# Patient Record
Sex: Female | Born: 1935 | Hispanic: Yes | Smoking: Never smoker
Health system: Southern US, Community
[De-identification: ages and names within clinical notes are randomized; demographics above are authoritative.]

## PROBLEM LIST (undated history)

## (undated) DIAGNOSIS — I739 Peripheral vascular disease, unspecified: Secondary | ICD-10-CM

## (undated) DIAGNOSIS — E785 Hyperlipidemia, unspecified: Secondary | ICD-10-CM

## (undated) DIAGNOSIS — J45909 Unspecified asthma, uncomplicated: Secondary | ICD-10-CM

## (undated) DIAGNOSIS — J439 Emphysema, unspecified: Secondary | ICD-10-CM

## (undated) DIAGNOSIS — J42 Unspecified chronic bronchitis: Secondary | ICD-10-CM

## (undated) DIAGNOSIS — E079 Disorder of thyroid, unspecified: Secondary | ICD-10-CM

## (undated) DIAGNOSIS — G309 Alzheimer's disease, unspecified: Secondary | ICD-10-CM

## (undated) DIAGNOSIS — F0280 Dementia in other diseases classified elsewhere without behavioral disturbance: Secondary | ICD-10-CM

## (undated) DIAGNOSIS — I10 Essential (primary) hypertension: Secondary | ICD-10-CM

## (undated) DIAGNOSIS — F341 Dysthymic disorder: Secondary | ICD-10-CM

---

## 2009-03-08 ENCOUNTER — Encounter: Payer: Self-pay | Admitting: Internal Medicine

## 2009-06-09 ENCOUNTER — Encounter: Payer: Self-pay | Admitting: Internal Medicine

## 2009-06-16 ENCOUNTER — Encounter: Payer: Self-pay | Admitting: Internal Medicine

## 2009-06-21 ENCOUNTER — Ambulatory Visit (HOSPITAL_BASED_OUTPATIENT_CLINIC_OR_DEPARTMENT_OTHER): Admission: RE | Admit: 2009-06-21 | Discharge: 2009-06-21 | Payer: Self-pay | Admitting: Internal Medicine

## 2009-06-21 ENCOUNTER — Ambulatory Visit: Payer: Self-pay | Admitting: Diagnostic Radiology

## 2009-08-24 ENCOUNTER — Telehealth: Payer: Self-pay | Admitting: Internal Medicine

## 2009-08-24 ENCOUNTER — Telehealth: Payer: Self-pay | Admitting: Pulmonary Disease

## 2009-10-12 ENCOUNTER — Encounter: Payer: Self-pay | Admitting: Internal Medicine

## 2009-10-26 ENCOUNTER — Ambulatory Visit: Payer: Self-pay | Admitting: Internal Medicine

## 2009-10-26 DIAGNOSIS — R0602 Shortness of breath: Secondary | ICD-10-CM | POA: Insufficient documentation

## 2009-10-26 DIAGNOSIS — F32A Depression, unspecified: Secondary | ICD-10-CM | POA: Insufficient documentation

## 2009-10-26 DIAGNOSIS — E039 Hypothyroidism, unspecified: Secondary | ICD-10-CM | POA: Insufficient documentation

## 2009-10-26 DIAGNOSIS — F329 Major depressive disorder, single episode, unspecified: Secondary | ICD-10-CM

## 2009-10-26 DIAGNOSIS — J4530 Mild persistent asthma, uncomplicated: Secondary | ICD-10-CM

## 2009-10-26 DIAGNOSIS — R05 Cough: Secondary | ICD-10-CM

## 2009-12-08 ENCOUNTER — Telehealth: Payer: Self-pay | Admitting: Internal Medicine

## 2010-03-01 NOTE — Progress Notes (Signed)
Summary: records release  Phone Note Outgoing Call   Call placed by: Carron Curie CMA,  December 08, 2009 1:02 PM Call placed to: pt Summary of Call: I called to ask pt is they signed a release for records from  [place], because I do not see the records or a release. Sounded liek someone picked up the phone but then was disconnected. I called back and line was busy. WCB. Initial call taken by: Carron Curie CMA,  December 08, 2009 1:03 PM  Follow-up for Phone Call        [email or web address]. Carron Curie CMA  December 17, 2009 11:45 AM  ATCx2 no answer, no voicemail., i wil sign offo n note.Carron Curie CMA  December 20, 2009 4:03 PM

## 2010-03-01 NOTE — Progress Notes (Signed)
Summary: nos appt  Phone Note Call from Patient   Caller: [email or web address]  Call For: Garett Tetzloff Summary of Call: ATC pt to rsc nos from 7/25 no vm. Initial call taken by: Darletta Moll,  August 24, 2009 2:07 PM

## 2010-03-01 NOTE — Letter (Signed)
Summary: [facility]   Imported By: Sherian Rein 11/01/2009 10:37:17  _____________________________________________________________________  External Attachment:    Type:   Image     Comment:   External Document

## 2010-03-01 NOTE — Assessment & Plan Note (Signed)
Summary: ASTHMA/ MBW   Visit Type:  Initial Consult Copy to:  Dr. Julio Sicks Primary Provider/Referring Provider:  Dr. Julio Sicks  CC:  Pulmonary Consult for asthma and COPD.Marland Kitchen  History of Present Illness: IOV 10/26/2009: 77 year old [place] immigrant with dementia. Never smoker but pasive smoker trhough age mid-30s..  Brought in by daughter in law Mackenzie Peterson who is interpreter. [name] states  moved to [place] from [place], [place] in Jan 2011 to be with son. Had pulmonologist at [facility]. [name] also saw a DR Henrene Dodge at [facility], [place]. 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 Patient states moderate cough  > 20 years. STable. Worse in morning and worse at night. Sometimes dry and sometimes mucus. Associated chronic daily  GERD + and post nasal drip +.  Cough made worse by "any effort" such as picking up stuff, walking. Cough relieved by relaxingand resting.   Cough is associated with dyspnea. She states she gets dyspneic walking. Dyspnea relieved by rest. Dyspnea is present for activities like changing clothes.   Only outside records available is from Dr Julio Sicks. THere are some spirometries that shows restriciton and one that shows obstrictuion - all from earlier this year.   Preventive Screening-Counseling & Management  Alcohol-Tobacco     Smoking Status: never     Passive Smoke Exposure: yes  Comments: smoked passively from birth to age 53  Current Medications (verified): 1)  Lexapro 20 Mg Tabs (Escitalopram Oxalate) .Marland Kitchen.. 1 Tab Once A Day 2)  Alprazolam 0.25 Mg Tabs (Alprazolam) .Marland Kitchen.. 1 Tab 2 Times A Day As Needed 3)  Nitroglycerin 0.4 Mg/hr Pt24 (Nitroglycerin) .Marland Kitchen.. 1 Spray Every 5 Minutes As Needed 4)  Aspir-Low 81 Mg Tbec (Aspirin) .Marland Kitchen.. 1 Tab Once A Day 5)  Ventolin Hfa 108 (90 Base) Mcg/act Aers (Albuterol Sulfate) .... In[place]le 2 Puffs 4 Times A Day 6)  Vesicare 5 Mg Tabs (Solifenacin Succinate) .Marland Kitchen.. 1 Tab  Once A Day 7)  Aricept 10 Mg Tabs (Donepezil Hcl) .Marland Kitchen.. 1 Tab Once A Day At Bedtime 8)  Namenda 10 Mg Tabs (Memantine Hcl) .Marland Kitchen.. 1 Tab 2 Times A Day 9)  Benzonatate 100 Mg Caps (Benzonatate) .Marland Kitchen.. 1 Cap 3 Times A Day As Needed Cough 10)  Seroquel 25 Mg Tabs (Quetiapine Fumarate) .Marland Kitchen.. 1 Tab Twice Daily As Needed and 2 Tabs At Night Two Times A Day 11)  Levothroid 25 Mcg Tabs (Levothyroxine Sodium) .... Take 1 Tablet By Mouth Once A Day 12)  Tylenol Arthritis Pain 650 Mg Cr-Tabs (Acetaminophen) .... As Needed  Allergies (verified): No Known Drug Allergies  Past History:  Past Medical History: hypertension arthritis overactive bladder alzhemer's dementia......diagnosed 2007  - "middle stage" per d-i-l.   - progressive per d-i-l  - incontinent with stool/urine. Needs diaper depression anxiety lung nodule nos  - dxed in [facility]  - extensively worked up in [facility] Hypothyroidism Depression Asthma  Past Surgical History: none  Family History: Father-COPD, smoker, heart disease, cancer? Mother-heart disease  Social History: Children:1 Lives with daughter in law Patient never smoked.  Smoking Status:  never Passive Smoke Exposure:  yes  Review of Systems       The patient complains of shortness of breath with activity, shortness of breath at rest, productive cough, chest pain, difficulty swallowing, and nasal congestion/difficulty breathing through nose.  The patient denies non-productive cough, coughing up blood, irregular heartbeats, acid heartburn, indigestion, loss of  appetite, weight change, abdominal pain, sore throat, tooth/dental problems, headaches, sneezing, itching, ear ache, anxiety, depression, hand/feet swelling, joint stiffness or pain, rash, change in color of mucus, and fever.    Vital Signs:  Patient profile:   77 year old female Height:      61 inches Weight:      138.25 pounds BMI:     26.22 O2 Sat:      96 % on Room air Temp:     97.9 degrees F  oral Pulse rate:   68 / minute BP sitting:   128 / 70  (right arm) Cuff size:   regular  Vitals Entered By: Carron Curie CMA (October 26, 2009 10:43 AM)  O2 Flow:  Room air CC: Pulmonary Consult for asthma and COPD. Comments Medications reviewed with patient Carron Curie CMA  October 26, 2009 10:46 AM Daytime phone number verified with patient.    Physical Exam  General:  well developed, well nourished, in no acute distress Head:  normocephalic and atraumatic Eyes:  PERRLA/EOM intact; conjunctiva and sclera clear Ears:  TMs intact and clear with normal canals Nose:  no deformity, discharge, inflammation, or lesions Mouth:  no deformity or lesions Neck:  no masses, thyromegaly, or abnormal cervical nodes Chest Wall:  no deformities noted Lungs:  decreased BS bilateral.   ? faint basal crackles Heart:  regular rate and rhythm, S1, S2 without murmurs, rubs, gallops, or clicks Abdomen:  bowel sounds positive; abdomen soft and non-tender without masses, or organomegaly Msk:  no deformity or scoliosis noted with normal posture Pulses:  pulses normal Extremities:  no clubbing, cyanosis, edema, or deformity noted Neurologic:  CN II-XII grossly intact with normal reflexes, coordination, muscle strength and tone Skin:  intact without lesions or rashes Cervical Nodes:  no significant adenopathy Axillary Nodes:  no significant adenopathy Psych:  alert and cooperative; normal mood and affect; normal attention span and concentration   MISC. Report  Procedure date:  10/26/2009  Findings:      THere are some spirometries that shows restriciton and one that shows obstrictuion - all from earlier this year.   Impression & Recommendations:  Problem # 1:  COUGH (ICD-786.2) Assessment New  Unclar cause from hix. But it appears sinus drainage and GERD plaing a role. HABIT cough likely given chronicity and extensive pulmonary workup hx. Given demential family prefer  simplistic approach focussing on quality of life which is appropriate. Outside spirometry is unhelpful due to variation. I will get old recoreds from [facility] and [place], [place] andthen decide appropriate course. FU depending on above  Orders: Consultation Level IV (09811)  Problem # 2:  SHORTNESS OF BREATH (SOB) (ICD-786.05) Assessment: New  as in cough  Orders: Consultation Level IV (91478)  Medications Added to Medication List This Visit: 1)  Levothroid 25 Mcg Tabs (Levothyroxine sodium) .... Take 1 tablet by mouth once a day 2)  Tylenol Arthritis Pain 650 Mg Cr-tabs (Acetaminophen) .... As needed  Patient Instructions: 1)  please sign release to get records from  2)   - [facility] 3)   - Dr Darnelle Maffucci, at [facility],  4)   - we need xray, ct, pft, hpi, dc summary reports 5)  Call us in 2 weeks to check if we have records 6)  FU only after records arrive   Immunization History:  Pneumovax Immunization History:    Pneumovax:  pneumovax (10/01/2006)

## 2010-03-01 NOTE — Progress Notes (Signed)
Summary: append  Phone Note Call from Patient   Caller: [email or web address]  Call For: Kariss Longmire Summary of Call: Ignore last phone note for Dr. Craige Cotta. Initial call taken by: Darletta Moll,  August 24, 2009 2:09 PM

## 2010-03-01 NOTE — Progress Notes (Signed)
Summary: nos appt  Phone Note Call from Patient   Caller: [email or web address]  Call For: Mackenzie Peterson Summary of Call: [facility] x2 to rsc nos from 7/25. Initial call taken by: Darletta Moll,  August 24, 2009 2:01 PM

## 2010-05-02 ENCOUNTER — Encounter: Payer: Self-pay | Admitting: *Deleted

## 2010-05-02 ENCOUNTER — Telehealth: Payer: Self-pay | Admitting: *Deleted

## 2010-05-02 NOTE — Telephone Encounter (Signed)
Summary of Call: Candise Bowens, we never got old records from [place], [place]. What is going on ? Please check with patient family and get recoreds. Give fu appt Initial call taken by: Kalman Shan MD,  April 18, 2010 11:09 PM  Follow-up for Phone Call         In November 2011 I attempted to call pt several times and was unable to reach them. Per MR send a letter asking the pt to contact our office. Please see EPIC for letter. Carron Curie CMA  May 02, 2010 3:37 PM

## 2010-09-23 ENCOUNTER — Emergency Department (HOSPITAL_COMMUNITY)
Admission: EM | Admit: 2010-09-23 | Discharge: 2010-09-25 | Disposition: A | Discharge status: Home / Self Care | Payer: Medicare Other | Attending: Emergency Medicine | Admitting: Emergency Medicine

## 2010-09-23 DIAGNOSIS — R10819 Abdominal tenderness, unspecified site: Secondary | ICD-10-CM | POA: Insufficient documentation

## 2010-09-23 DIAGNOSIS — R0602 Shortness of breath: Secondary | ICD-10-CM | POA: Insufficient documentation

## 2010-09-23 DIAGNOSIS — F329 Major depressive disorder, single episode, unspecified: Secondary | ICD-10-CM | POA: Insufficient documentation

## 2010-09-23 DIAGNOSIS — F3289 Other specified depressive episodes: Secondary | ICD-10-CM | POA: Insufficient documentation

## 2010-09-23 LAB — RAPID URINE DRUG SCREEN, HOSP PERFORMED
Amphetamines: NOT DETECTED
Barbiturates: NOT DETECTED
Benzodiazepines: POSITIVE — AB
Cocaine: NOT DETECTED

## 2010-09-23 LAB — DIFFERENTIAL
Basophils Absolute: 0.1 10*3/uL (ref 0.0–0.1)
Eosinophils Absolute: 0.1 10*3/uL (ref 0.0–0.7)
Lymphocytes Relative: 26 % (ref 12–46)
Lymphs Abs: 2.7 10*3/uL (ref 0.7–4.0)
Monocytes Absolute: 0.7 10*3/uL (ref 0.1–1.0)
Monocytes Relative: 7 % (ref 3–12)
Neutro Abs: 6.9 10*3/uL (ref 1.7–7.7)

## 2010-09-23 LAB — COMPREHENSIVE METABOLIC PANEL
AST: 19 U/L (ref 0–37)
Albumin: 3.9 g/dL (ref 3.5–5.2)
CO2: 28 mEq/L (ref 19–32)
GFR calc non Af Amer: 50 mL/min — ABNORMAL LOW (ref >60)
Glucose, Bld: 100 mg/dL — ABNORMAL HIGH (ref 70–99)
Sodium: 137 mEq/L (ref 135–145)
Total Protein: 7.3 g/dL (ref 6.0–8.3)

## 2010-09-23 LAB — CBC
Hemoglobin: 14.9 g/dL (ref 12.0–15.0)
Platelets: 254 10*3/uL (ref 150–400)
WBC: 10.5 10*3/uL (ref 4.0–10.5)

## 2010-09-23 LAB — ETHANOL: Alcohol, Ethyl (B): <11 mg/dL (ref 0–11)

## 2010-09-24 ENCOUNTER — Emergency Department (HOSPITAL_COMMUNITY): Payer: Medicare Other

## 2010-09-24 LAB — URINALYSIS, ROUTINE W REFLEX MICROSCOPIC
Hgb urine dipstick: NEGATIVE
Ketones, ur: NEGATIVE mg/dL
Leukocytes, UA: NEGATIVE
Nitrite: NEGATIVE
Protein, ur: NEGATIVE mg/dL
Specific Gravity, Urine: 1.022 (ref 1.005–1.030)
Urobilinogen, UA: 0.2 mg/dL (ref 0.0–1.0)
pH: 6.5 (ref 5.0–8.0)

## 2012-01-05 ENCOUNTER — Encounter (HOSPITAL_BASED_OUTPATIENT_CLINIC_OR_DEPARTMENT_OTHER): Payer: Self-pay | Admitting: Emergency Medicine

## 2012-01-05 ENCOUNTER — Emergency Department (HOSPITAL_BASED_OUTPATIENT_CLINIC_OR_DEPARTMENT_OTHER): Payer: Medicare Other

## 2012-01-05 ENCOUNTER — Inpatient Hospital Stay (HOSPITAL_BASED_OUTPATIENT_CLINIC_OR_DEPARTMENT_OTHER)
Admission: EM | Admit: 2012-01-05 | Discharge: 2012-01-08 | DRG: 193 | Disposition: A | Discharge status: Home Health | Payer: Medicare Other | Attending: Internal Medicine | Admitting: Internal Medicine

## 2012-01-05 DIAGNOSIS — Z79899 Other long term (current) drug therapy: Secondary | ICD-10-CM

## 2012-01-05 DIAGNOSIS — F329 Major depressive disorder, single episode, unspecified: Secondary | ICD-10-CM | POA: Diagnosis present

## 2012-01-05 DIAGNOSIS — R197 Diarrhea, unspecified: Secondary | ICD-10-CM | POA: Diagnosis present

## 2012-01-05 DIAGNOSIS — E039 Hypothyroidism, unspecified: Secondary | ICD-10-CM | POA: Diagnosis present

## 2012-01-05 DIAGNOSIS — I10 Essential (primary) hypertension: Secondary | ICD-10-CM | POA: Diagnosis present

## 2012-01-05 DIAGNOSIS — J9620 Acute and chronic respiratory failure, unspecified whether with hypoxia or hypercapnia: Secondary | ICD-10-CM | POA: Diagnosis present

## 2012-01-05 DIAGNOSIS — E785 Hyperlipidemia, unspecified: Secondary | ICD-10-CM | POA: Diagnosis present

## 2012-01-05 DIAGNOSIS — R05 Cough: Secondary | ICD-10-CM

## 2012-01-05 DIAGNOSIS — F0280 Dementia in other diseases classified elsewhere without behavioral disturbance: Secondary | ICD-10-CM | POA: Diagnosis present

## 2012-01-05 DIAGNOSIS — R0602 Shortness of breath: Secondary | ICD-10-CM | POA: Diagnosis present

## 2012-01-05 DIAGNOSIS — J189 Pneumonia, unspecified organism: Principal | ICD-10-CM | POA: Diagnosis present

## 2012-01-05 DIAGNOSIS — J45909 Unspecified asthma, uncomplicated: Secondary | ICD-10-CM | POA: Diagnosis present

## 2012-01-05 DIAGNOSIS — F32A Depression, unspecified: Secondary | ICD-10-CM | POA: Diagnosis present

## 2012-01-05 DIAGNOSIS — Z7982 Long term (current) use of aspirin: Secondary | ICD-10-CM

## 2012-01-05 DIAGNOSIS — D72829 Elevated white blood cell count, unspecified: Secondary | ICD-10-CM | POA: Diagnosis present

## 2012-01-05 DIAGNOSIS — G309 Alzheimer's disease, unspecified: Secondary | ICD-10-CM | POA: Diagnosis present

## 2012-01-05 DIAGNOSIS — J438 Other emphysema: Secondary | ICD-10-CM | POA: Diagnosis present

## 2012-01-05 DIAGNOSIS — F0390 Unspecified dementia without behavioral disturbance: Secondary | ICD-10-CM | POA: Diagnosis present

## 2012-01-05 DIAGNOSIS — I739 Peripheral vascular disease, unspecified: Secondary | ICD-10-CM | POA: Diagnosis present

## 2012-01-05 DIAGNOSIS — R109 Unspecified abdominal pain: Secondary | ICD-10-CM | POA: Diagnosis present

## 2012-01-05 DIAGNOSIS — J4530 Mild persistent asthma, uncomplicated: Secondary | ICD-10-CM | POA: Diagnosis present

## 2012-01-05 HISTORY — DX: Dementia in other diseases classified elsewhere, unspecified severity, without behavioral disturbance, psychotic disturbance, mood disturbance, and anxiety: F02.80

## 2012-01-05 HISTORY — DX: Unspecified chronic bronchitis: J42

## 2012-01-05 HISTORY — DX: Emphysema, unspecified: J43.9

## 2012-01-05 HISTORY — DX: Disorder of thyroid, unspecified: E07.9

## 2012-01-05 HISTORY — DX: Peripheral vascular disease, unspecified: I73.9

## 2012-01-05 HISTORY — DX: Dysthymic disorder: F34.1

## 2012-01-05 HISTORY — DX: Essential (primary) hypertension: I10

## 2012-01-05 HISTORY — DX: Alzheimer's disease, unspecified: G30.9

## 2012-01-05 HISTORY — DX: Unspecified asthma, uncomplicated: J45.909

## 2012-01-05 HISTORY — DX: Hyperlipidemia, unspecified: E78.5

## 2012-01-05 LAB — CBC WITH DIFFERENTIAL/PLATELET
Basophils Absolute: 0 10*3/uL (ref 0.0–0.1)
Eosinophils Absolute: 0.7 10*3/uL (ref 0.0–0.7)
HCT: 38.3 % (ref 36.0–46.0)
Lymphs Abs: 1.1 10*3/uL (ref 0.7–4.0)
MCHC: 34.7 g/dL (ref 30.0–36.0)
Monocytes Absolute: 0.9 10*3/uL (ref 0.1–1.0)
Monocytes Relative: 8 % (ref 3–12)
Neutro Abs: 8.5 10*3/uL — ABNORMAL HIGH (ref 1.7–7.7)
Neutrophils Relative %: 76 % (ref 43–77)
Platelets: 254 10*3/uL (ref 150–400)
RDW: 12.4 % (ref 11.5–15.5)

## 2012-01-05 LAB — URINALYSIS, ROUTINE W REFLEX MICROSCOPIC
Ketones, ur: NEGATIVE mg/dL
Leukocytes, UA: NEGATIVE
Protein, ur: NEGATIVE mg/dL
Specific Gravity, Urine: 1.019 (ref 1.005–1.030)
Urobilinogen, UA: 0.2 mg/dL (ref 0.0–1.0)
pH: 5 (ref 5.0–8.0)

## 2012-01-05 LAB — COMPREHENSIVE METABOLIC PANEL
BUN: 15 mg/dL (ref 6–23)
CO2: 28 mEq/L (ref 19–32)
Calcium: 8.8 mg/dL (ref 8.4–10.5)
Chloride: 102 mEq/L (ref 96–112)
Creatinine, Ser: 1 mg/dL (ref 0.50–1.10)
GFR calc non Af Amer: 50 mL/min — ABNORMAL LOW (ref >90)

## 2012-01-05 LAB — URINE MICROSCOPIC-ADD ON

## 2012-01-05 MED ORDER — SODIUM CHLORIDE 0.9 % IJ SOLN
3.0000 mL | Freq: Two times a day (BID) | INTRAMUSCULAR | Status: DC
  Administered 2012-01-06: 3 mL via INTRAVENOUS
  Administered 2012-01-06: 21:00:00 via INTRAVENOUS
  Administered 2012-01-07 – 2012-01-08 (×2): 3 mL via INTRAVENOUS

## 2012-01-05 MED ORDER — ESCITALOPRAM OXALATE 20 MG PO TABS
20.0000 mg | ORAL_TABLET | Freq: Every day | ORAL | Status: DC
  Administered 2012-01-06 – 2012-01-08 (×3): 20 mg via ORAL
  Filled 2012-01-05 (×3): qty 1

## 2012-01-05 MED ORDER — MEMANTINE HCL 10 MG PO TABS
10.0000 mg | ORAL_TABLET | Freq: Two times a day (BID) | ORAL | Status: DC
  Administered 2012-01-06 – 2012-01-08 (×6): 10 mg via ORAL
  Filled 2012-01-05 (×7): qty 1

## 2012-01-05 MED ORDER — NEOMYCIN-POLYMYXIN-HC 3.5-10000-1 OT SOLN
3.0000 [drp] | Freq: Four times a day (QID) | OTIC | Status: DC
  Filled 2012-01-05: qty 10

## 2012-01-05 MED ORDER — DARIFENACIN HYDROBROMIDE ER 7.5 MG PO TB24
7.5000 mg | ORAL_TABLET | Freq: Every day | ORAL | Status: DC
  Administered 2012-01-06: 7.5 mg via ORAL
  Filled 2012-01-05 (×2): qty 1

## 2012-01-05 MED ORDER — SODIUM CHLORIDE 0.9 % IV SOLN
1000.0000 mL | Freq: Once | INTRAVENOUS | Status: AC
  Administered 2012-01-05: 1000 mL via INTRAVENOUS

## 2012-01-05 MED ORDER — SODIUM CHLORIDE 0.9 % IV SOLN: 1000.0000 mL | INTRAVENOUS | Status: DC

## 2012-01-05 MED ORDER — DEXTROSE 5 % IV SOLN
1.0000 g | Freq: Once | INTRAVENOUS | Status: AC
  Administered 2012-01-05: 1 g via INTRAVENOUS
  Filled 2012-01-05: qty 10

## 2012-01-05 MED ORDER — ALBUTEROL SULFATE (5 MG/ML) 0.5% IN NEBU: 2.5000 mg | INHALATION_SOLUTION | RESPIRATORY_TRACT | Status: DC | PRN

## 2012-01-05 MED ORDER — DEXTROSE 5 % IV SOLN
500.0000 mg | Freq: Once | INTRAVENOUS | Status: AC
  Administered 2012-01-05: 500 mg via INTRAVENOUS
  Filled 2012-01-05: qty 500

## 2012-01-05 MED ORDER — ENOXAPARIN SODIUM 30 MG/0.3ML ~~LOC~~ SOLN
30.0000 mg | SUBCUTANEOUS | Status: DC
  Administered 2012-01-06 – 2012-01-07 (×3): 30 mg via SUBCUTANEOUS
  Filled 2012-01-05 (×4): qty 0.3

## 2012-01-05 MED ORDER — LEVOTHYROXINE SODIUM 25 MCG PO TABS
25.0000 ug | ORAL_TABLET | Freq: Every day | ORAL | Status: DC
  Administered 2012-01-06 – 2012-01-08 (×3): 25 ug via ORAL
  Filled 2012-01-05 (×5): qty 1

## 2012-01-05 MED ORDER — ALBUTEROL SULFATE HFA 108 (90 BASE) MCG/ACT IN AERS
2.0000 | INHALATION_SPRAY | Freq: Four times a day (QID) | RESPIRATORY_TRACT | Status: DC | PRN
  Filled 2012-01-05: qty 6.7

## 2012-01-05 MED ORDER — QUETIAPINE FUMARATE 50 MG PO TABS
50.0000 mg | ORAL_TABLET | Freq: Every day | ORAL | Status: DC
  Administered 2012-01-06 – 2012-01-07 (×3): 50 mg via ORAL
  Filled 2012-01-05 (×4): qty 1

## 2012-01-05 MED ORDER — DONEPEZIL HCL 10 MG PO TABS
10.0000 mg | ORAL_TABLET | Freq: Every evening | ORAL | Status: DC | PRN
  Administered 2012-01-06: 10 mg via ORAL
  Filled 2012-01-05: qty 1

## 2012-01-05 MED ORDER — DEXTROSE 5 % IV SOLN
1.0000 g | INTRAVENOUS | Status: DC
  Administered 2012-01-06 – 2012-01-07 (×2): 1 g via INTRAVENOUS
  Filled 2012-01-05 (×3): qty 10

## 2012-01-05 MED ORDER — ONDANSETRON HCL 4 MG PO TABS: 4.0000 mg | ORAL_TABLET | Freq: Four times a day (QID) | ORAL | Status: DC | PRN

## 2012-01-05 MED ORDER — MORPHINE SULFATE 4 MG/ML IJ SOLN
4.0000 mg | Freq: Once | INTRAMUSCULAR | Status: AC
  Administered 2012-01-05: 4 mg via INTRAVENOUS
  Filled 2012-01-05: qty 1

## 2012-01-05 MED ORDER — TRAZODONE HCL 50 MG PO TABS
50.0000 mg | ORAL_TABLET | Freq: Every day | ORAL | Status: DC
  Administered 2012-01-06 (×2): 50 mg via ORAL
  Filled 2012-01-05 (×3): qty 1

## 2012-01-05 MED ORDER — HYDROCODONE-ACETAMINOPHEN 5-325 MG PO TABS: 1.0000 | ORAL_TABLET | ORAL | Status: DC | PRN

## 2012-01-05 MED ORDER — LORATADINE 10 MG PO TABS
10.0000 mg | ORAL_TABLET | Freq: Every day | ORAL | Status: DC
  Administered 2012-01-06 – 2012-01-08 (×3): 10 mg via ORAL
  Filled 2012-01-05 (×3): qty 1

## 2012-01-05 MED ORDER — NITROGLYCERIN 0.4 MG SL SUBL: 0.4000 mg | SUBLINGUAL_TABLET | SUBLINGUAL | Status: DC | PRN

## 2012-01-05 MED ORDER — ASPIRIN 81 MG PO TABS: 81.0000 mg | ORAL_TABLET | Freq: Every day | ORAL | Status: DC

## 2012-01-05 MED ORDER — IRBESARTAN 75 MG PO TABS
75.0000 mg | ORAL_TABLET | Freq: Every day | ORAL | Status: DC
  Filled 2012-01-05 (×2): qty 1

## 2012-01-05 MED ORDER — SODIUM CHLORIDE 0.9 % IV SOLN
INTRAVENOUS | Status: AC
  Administered 2012-01-06: 01:00:00 via INTRAVENOUS

## 2012-01-05 MED ORDER — AZITHROMYCIN 500 MG PO TABS
500.0000 mg | ORAL_TABLET | ORAL | Status: DC
  Administered 2012-01-06 – 2012-01-08 (×3): 500 mg via ORAL
  Filled 2012-01-05 (×3): qty 1

## 2012-01-05 MED ORDER — SIMVASTATIN 10 MG PO TABS
10.0000 mg | ORAL_TABLET | Freq: Every day | ORAL | Status: DC
  Administered 2012-01-06 – 2012-01-07 (×2): 10 mg via ORAL
  Filled 2012-01-05 (×3): qty 1

## 2012-01-05 MED ORDER — ONDANSETRON HCL 4 MG/2ML IJ SOLN: 4.0000 mg | Freq: Four times a day (QID) | INTRAMUSCULAR | Status: DC | PRN

## 2012-01-05 MED ORDER — ASPIRIN EC 81 MG PO TBEC
81.0000 mg | DELAYED_RELEASE_TABLET | Freq: Every day | ORAL | Status: DC
  Administered 2012-01-06 – 2012-01-08 (×3): 81 mg via ORAL
  Filled 2012-01-05 (×3): qty 1

## 2012-01-05 MED ORDER — ALPRAZOLAM 0.5 MG PO TABS
0.5000 mg | ORAL_TABLET | Freq: Every evening | ORAL | Status: DC | PRN
  Administered 2012-01-06 – 2012-01-07 (×2): 0.5 mg via ORAL
  Filled 2012-01-05 (×3): qty 1

## 2012-01-05 MED ORDER — ONDANSETRON HCL 4 MG/2ML IJ SOLN
4.0000 mg | Freq: Once | INTRAMUSCULAR | Status: AC
  Administered 2012-01-05: 4 mg via INTRAVENOUS
  Filled 2012-01-05: qty 2

## 2012-01-05 NOTE — ED Notes (Signed)
Pt. Is in radiology at present time.  Will start abx when she returns to room 11.

## 2012-01-05 NOTE — ED Notes (Signed)
[facility] to [facility].  For room 6710

## 2012-01-05 NOTE — ED Notes (Addendum)
Pt. Speaks very little [place]. Daughter in law at bedside translating. Fever to 100.4 x one week.  Temporarily relieved with Motrin, but then fever returns.  Daughter in law states she has a chronic cough, but this cough is different.  Low back pain x one week.  Decreased PO intake.  Increased incontinence.  Decreased PO intake.  Nausea, no vomiting.

## 2012-01-05 NOTE — ED Provider Notes (Signed)
History     CSN: [id number]  Arrival date & time 01/05/12  1341   First MD Initiated Contact with Patient 01/05/12 1352      Chief Complaint  Patient presents with  . Fever  . Back Pain  . Cough    (Consider location/radiation/quality/duration/timing/severity/associated sxs/prior treatment) The history is provided by the patient and a relative. A language interpreter was used (Daughter).   the patient has had low-grade fever for 5-6 days.  The patient has had worsening productive cough and exertional shortness of breath.  She's also had nausea and diarrhea without vomiting.  He does report generalized abdominal tenderness and tenderness across her upper abdomen.  No prior abdominal surgical history.  The patient reports some pain around her right flank with new urinary incontinence.  No dysuria or urinary frequency.  She also reports discomfort and pain in her right scapular region which is new over the past several days.  The patient's daughter reports that her mother has just been lying around and has had decreased level of energy.  Past Medical History  Diagnosis Date  . Asthma   . Hypertension   . Hyperlipidemia   . Chronic bronchitis   . hypothyroid   . PVD (peripheral vascular disease)   . Emphysema   . Dysthymic disorder   . Alzheimer disease     History reviewed. No pertinent past surgical history.  History reviewed. No pertinent family history.  History  Substance Use Topics  . Smoking status: Never Smoker   . Smokeless tobacco: Not on file  . Alcohol Use: No    OB History    Grav Para Term Preterm Abortions TAB SAB Ect Mult Living                  Review of Systems  All other systems reviewed and are negative.    Allergies  Review of patient's allergies indicates not on file.  Home Medications   Current Outpatient Rx  Name  Route  Sig  Dispense  Refill  . ALBUTEROL SULFATE HFA 108 (90 BASE) MCG/ACT IN AERS   Inhalation   Inhale 2 puffs into  the lungs every 6 (six) hours as needed.         . ALPRAZOLAM 0.5 MG PO TABS   Oral   Take 0.5 mg by mouth at bedtime as needed.         . ASPIRIN 81 MG PO TABS   Oral   Take 81 mg by mouth daily.         Marland Kitchen CETIRIZINE HCL 10 MG PO TABS   Oral   Take 10 mg by mouth daily.         . DONEPEZIL HCL 10 MG PO TABS   Oral   Take 10 mg by mouth at bedtime as needed.         Marland Kitchen ESCITALOPRAM OXALATE 20 MG PO TABS   Oral   Take 20 mg by mouth daily.         . OMEGA-3 FATTY ACIDS 1000 MG PO CAPS   Oral   Take 2 g by mouth daily.         Marland Kitchen LEVOFLOXACIN 500 MG PO TABS   Oral   Take 500 mg by mouth daily.         Marland Kitchen LEVOTHYROXINE SODIUM 25 MCG PO TABS   Oral   Take 25 mcg by mouth daily.         Marland Kitchen MEMANTINE  HCL 10 MG PO TABS   Oral   Take 10 mg by mouth 2 (two) times daily.         . NEOMYCIN-POLYMYXIN-HC 3.5-10000-1 OT SOLN      3 drops 4 (four) times daily.         Marland Kitchen NITROGLYCERIN 0.4 MG SL SUBL   Sublingual   Place 0.4 mg under the tongue every 5 (five) minutes as needed.         Marland Kitchen OLMESARTAN MEDOXOMIL 20 MG PO TABS   Oral   Take 20 mg by mouth daily.         Marland Kitchen PRAVASTATIN SODIUM 20 MG PO TABS   Oral   Take 20 mg by mouth daily.         . QUETIAPINE FUMARATE 50 MG PO TABS   Oral   Take 50 mg by mouth at bedtime.         . SOLIFENACIN SUCCINATE 5 MG PO TABS   Oral   Take 10 mg by mouth daily.         . TRAZODONE HCL 50 MG PO TABS   Oral   Take 50 mg by mouth at bedtime.           BP 128/52  Pulse 94  Temp 100.1 F (37.8 C) (Rectal)  Resp 48  SpO2 95%  Physical Exam  Nursing note and vitals reviewed. Constitutional: She is oriented to person, place, and time. She appears well-developed and well-nourished. No distress.  HENT:  Head: Normocephalic and atraumatic.  Eyes: EOM are normal.  Neck: Normal range of motion.  Cardiovascular: Normal rate, regular rhythm and normal heart sounds.   Pulmonary/Chest: Effort normal  and breath sounds normal.  Abdominal: Soft. She exhibits no distension.       Mild generalized upper abdominal tenderness with mild tenderness in right upper quadrant.  No guarding or rebound.  No Murphy sign present   Genitourinary:       No CVA tenderness  Musculoskeletal: Normal range of motion.  Neurological: She is alert and oriented to person, place, and time.  Skin: Skin is warm and dry.  Psychiatric: She has a normal mood and affect. Judgment normal.    ED Course  Procedures (including critical care time)   Labs Reviewed  LACTIC ACID, PLASMA  CBC WITH DIFFERENTIAL  COMPREHENSIVE METABOLIC PANEL  CULTURE, BLOOD (ROUTINE X 2)  CULTURE, BLOOD (ROUTINE X 2)  URINALYSIS, ROUTINE W REFLEX MICROSCOPIC  URINE CULTURE  LIPASE, BLOOD  TROPONIN I   No results found.   No diagnosis found.   Date: 01/05/2012  Rate: 74  Rhythm: normal sinus rhythm  QRS Axis: normal  Intervals: normal  ST/T Wave abnormalities: normal  Conduction Disutrbances: none  Narrative Interpretation:   Old EKG Reviewed: No significant changes noted     MDM  Chest x-ray labs and blood cultures pending.  Urinalysis pending.  The patient has evidence of pneumonia then she'll need to be admitted for her pneumonia and started on antibiotics.  Her chest x-ray is clear she'll likely need advanced imaging of her abdomen given her mild upper abdominal tenderness as consideration for cholecystitis has to be considered.  3:30 PM Will treat patient for community-acquired pneumonia with Rocephin and azithromycin.  Given her age of 89 she will Sheliah Hatch admission to the hospital.  Awaiting basic labs.  Given her ongoing tenderness in right upper quadrant an abdominal ultrasound will be obtained evaluating for the possibility of concurrent cholecystitis.  Lyanne Co, MD 01/05/12 [phone]

## 2012-01-05 NOTE — H&P (Addendum)
[facility] History and Physical  Stefana Lodico ZOX:[id number] DOB: Dec 31, 1924 DOA: 01/05/2012  Referring physician: ED physician PCP: [name], MD   Chief Complaint: Shortness of breath  HPI:  Pt is 78 yo female who initially presented to [facility] with main concern of progressively worsening shortness of breath associated with subjective fevers, chills, productive cough of yellow sputum, nausea, non watery diarrhea, poor oral intake. This initially started 5 days prior to admission and per pt with no specific alleviating or aggravating factors. Pt did however notice shortness of breath was worse with exertion initially but now also present at rest. She denies recent similar events. Pt also reports generalized abdominal pain, mostly located in the right upper quadrant area but occasionally radiating to bilateral flank areas and lower abdominal quadrants.  ED EVENTS: Pt found to have mild leukocytosis, Tmax 100.1 F, and worrisome finding of PNA on CXR, asked to be transferred from [facility] to [facility] for further evaluation.  Assessment and Plan:  Principal Problem:  *SHORTNESS OF BREATH (SOB) - this is most likely secondary to pneumonia as indicated by symptoms and CXR findings  - will monitor pt on medical floor - continue ABX: Rocephin and Zithromax - sputum gram stain and culture obtain, as well as urine Legionella and Strep Pneumo - blood cultures obtained in [facility] - please note that CXR indicated importance of follow up imagining to ensure resolution  Active Problems:  Abdominal pain with nausea and diarrhea - unclear etiology at this time and possibly related to principal problem - will provide supportive care with IVF for 10-12 hours, analgesia and antiemetics as needed - Abdominal US unremarkable for acute etiology  ASTHMA - will monitor oxygen saturation on medical floor - provide albuterol inhaler as per home medication regimen  Leukocytosis -  likely secondary to an infectious etiology PNA as noted above - provide ABX and follow up on microbiology studies  HYPOTHYROIDISM - stable, continue Synthroid  Code Status: Full Family Communication: Pt at bedside Disposition Plan: Keep on medical floor  Review of Systems:  Constitutional: Positive for fever, chills and malaise/fatigue. Negative for diaphoresis.  HENT: Negative for hearing loss, ear pain, nosebleeds, congestion, sore throat, neck pain, tinnitus and ear discharge.   Eyes: Negative for blurred vision, double vision, photophobia, pain, discharge and redness.  Respiratory: Positive for cough, sputum production, shortness of breath  Cardiovascular: Negative for chest pain, palpitations, orthopnea, claudication and leg swelling.  Gastrointestinal: Positive for nausea, and abdominal pain. Negative for vomiting, heartburn, constipation, blood in stool and melena.  Genitourinary: Negative for dysuria, urgency, frequency, hematuria and flank pain.  Musculoskeletal: Negative for myalgias, back pain, joint pain and falls.  Skin: Negative for itching and rash.  Neurological: Negative for dizziness and weakness, tingling, tremors, sensory change, speech change, focal weakness, loss of consciousness and headaches.  Endo/Heme/Allergies: Negative for environmental allergies and polydipsia. Does not bruise/bleed easily.  Psychiatric/Behavioral: Negative for suicidal ideas. The patient is not nervous/anxious.      Past Medical History  Diagnosis Date  . Asthma   . Hypertension   . Hyperlipidemia   . Chronic bronchitis   . hypothyroid   . PVD (peripheral vascular disease)   . Emphysema   . Dysthymic disorder   . Alzheimer disease     History reviewed. No pertinent past surgical history.  Social History:  reports that she has never smoked. She does not have any smokeless tobacco history on file. She reports that she does  not drink alcohol or use illicit drugs.  No family history  of cancers or heart disease.   Prior to Admission medications   Medication Sig Start Date End Date Taking? Authorizing Provider  albuterol (PROVENTIL HFA;VENTOLIN HFA) 108 (90 BASE) MCG/ACT inhaler Inhale 2 puffs into the lungs every 6 (six) hours as needed.   Yes Historical Provider, MD  ALPRAZolam Prudy Feeler) 0.5 MG tablet Take 0.5 mg by mouth at bedtime as needed.   Yes Historical Provider, MD  aspirin 81 MG tablet Take 81 mg by mouth daily.   Yes Historical Provider, MD  cetirizine (ZYRTEC) 10 MG tablet Take 10 mg by mouth daily.   Yes Historical Provider, MD  donepezil (ARICEPT) 10 MG tablet Take 10 mg by mouth at bedtime as needed.   Yes Historical Provider, MD  escitalopram (LEXAPRO) 20 MG tablet Take 20 mg by mouth daily.   Yes Historical Provider, MD  fish oil-omega-3 fatty acids 1000 MG capsule Take 2 g by mouth daily.   Yes Historical Provider, MD  levofloxacin (LEVAQUIN) 500 MG tablet Take 500 mg by mouth daily.   Yes Historical Provider, MD  levothyroxine (SYNTHROID, LEVOTHROID) 25 MCG tablet Take 25 mcg by mouth daily.   Yes Historical Provider, MD  memantine (NAMENDA) 10 MG tablet Take 10 mg by mouth 2 (two) times daily.   Yes Historical Provider, MD  neomycin-polymyxin-hydrocortisone (CORTISPORIN) otic solution 3 drops 4 (four) times daily.   Yes Historical Provider, MD  nitroGLYCERIN (NITROSTAT) 0.4 MG SL tablet Place 0.4 mg under the tongue every 5 (five) minutes as needed.   Yes Historical Provider, MD  olmesartan (BENICAR) 20 MG tablet Take 20 mg by mouth daily.   Yes Historical Provider, MD  pravastatin (PRAVACHOL) 20 MG tablet Take 20 mg by mouth daily.   Yes Historical Provider, MD  QUEtiapine (SEROQUEL) 50 MG tablet Take 50 mg by mouth at bedtime.   Yes Historical Provider, MD  solifenacin (VESICARE) 5 MG tablet Take 10 mg by mouth daily.   Yes Historical Provider, MD  traZODone (DESYREL) 50 MG tablet Take 50 mg by mouth at bedtime.   Yes Historical Provider, MD    Physical  Exam: Filed Vitals:   01/05/12 1426 01/05/12 1607 01/05/12 1807 01/05/12 1951  BP:  105/46 107/43 109/42  Pulse:  70 78 85  Temp: 100.1 F (37.8 C)   97.4 F (36.3 C)  TempSrc: Rectal   Axillary  Resp:  30 24 21   Height:    5' (1.524 m)  Weight:    57.698 kg (127 lb 3.2 oz)  SpO2: 95% 98% 98% 92%    Physical Exam  Constitutional: Appears well-developed and well-nourished. No distress.  HENT: Normocephalic. External right and left ear normal. Oropharynx is clear and moist.  Eyes: Conjunctivae and EOM are normal. PERRLA, no scleral icterus.  Neck: Normal ROM. Neck supple. No JVD. No tracheal deviation. No thyromegaly.  CVS: RRR, S1/S2 +, no murmurs, no gallops, no carotid bruit.  Pulmonary: Effort and breath sounds normal, decreased breath sounds at bases, scattered rhonchi Abdominal: Soft. BS +,  no distension, tenderness in epigastric area, no rebound or guarding.  Musculoskeletal: Normal range of motion. No edema and no tenderness.  Lymphadenopathy: No lymphadenopathy noted, cervical, inguinal. Neuro: Alert. Normal reflexes, muscle tone coordination. No cranial nerve deficit. Skin: Skin is warm and dry. No rash noted. Not diaphoretic. No erythema. No pallor.  Psychiatric: Normal mood and affect. Behavior, judgment, thought content normal.   Labs on Admission:  Basic Metabolic Panel:  Lab 01/05/12 8295  NA 139  K 4.1  CL 102  CO2 28  GLUCOSE 105*  BUN 15  CREATININE 1.00  CALCIUM 8.8  MG --  PHOS --   Liver Function Tests:  Lab 01/05/12 1422  AST 18  ALT 11  ALKPHOS 69  BILITOT 0.3  PROT 7.1  ALBUMIN 3.0*    Lab 01/05/12 1422  LIPASE 56  AMYLASE --   CBC:  Lab 01/05/12 1422  WBC 11.2*  NEUTROABS 8.5*  HGB 13.3  HCT 38.3  MCV 88.9  PLT 254   Cardiac Enzymes:  Lab 01/05/12 1422  CKTOTAL --  CKMB --  CKMBINDEX --  TROPONINI <0.30    Radiological Exams on Admission: Dg Chest 2 View  01/05/2012  *RADIOLOGY REPORT*  Clinical Data: Fever,  cough, back pain  CHEST - 2 VIEW  Comparison: 09/24/2010  Findings: Cardiomediastinal silhouette is stable.  Diffuse bilateral reticulonodular interstitial prominence again noted. There is  superimposed nodular consolidation in the lingula. Although this may be due to pneumonia infiltrative process cannot be excluded.  Follow-up to complete resolution after treatment is recommended.  IMPRESSION: Diffuse bilateral reticulonodular interstitial prominence again noted.  There is  superimposed nodular consolidation in the lingula.  Although this may be due to pneumonia infiltrative process cannot be excluded.  Follow-up to complete resolution after treatment is recommended.   Original Report Authenticated By: Natasha Mead, M.D.    US Abdomen Complete  01/05/2012  *RADIOLOGY REPORT*  Clinical Data:  Nausea, diarrhea, cough and fever.  COMPLETE ABDOMINAL ULTRASOUND  Comparison:  Chest CT 06/21/2009.  Findings:  Gallbladder:  Exam limited.  No obvious stones or gallbladder wall thickening.  The patient was not tender over this region during scanning per ultrasound technologist.  Common bile duct:  3.0 mm.  Liver:  1.3 cm cyst right lobe liver.  IVC:  Limited evaluation.  Pancreas:  Limited evaluation.  Spleen:  Limited evaluation.  Measures up to 6.2 cm.  No obvious mass.  Right Kidney:  10.0 cm.  Evaluation limited.  No obvious mass or hydronephrosis.  Left Kidney:  9.1 cm.  Evaluation limited.  No obvious mass or hydronephrosis.  Abdominal aorta:  Evaluation limited.  Portion visualized measures up to 1.8 cm.  IMPRESSION: Examination is limited by patient's inability to take deep breaths and large amount of bowel gas as per ultrasound technologist.  No obvious gallstones or gallbladder wall thickening.  1.3 cm liver cyst.   Original Report Authenticated By: Lacy Duverney, M.D.     EKG: Normal sinus rhythm, no ST/T wave changes  Debbora Presto, MD  [facility] Pager [phone]  If 7PM-7AM, please contact  night-coverage [email or web address] Password [facility] 01/05/2012, 8:27 PM

## 2012-01-05 NOTE — ED Provider Notes (Signed)
Assumed care from Dr. Dessa Phi at sign out. This is a 78 yo F with productive cough with CXR concerning for pneumonia. WBC 11.2, labs otherwise unremarkable. She was given ceftriaxone and azithro IV. RUQ US unremarkable. I discussed with Dr. Jomarie Longs, who accepted the transfer to [facility].   Results for orders placed during the hospital encounter of 01/05/12  CBC WITH DIFFERENTIAL      Component Value Range   WBC 11.2 (*) 4.0 - 10.5 K/uL   RBC 4.31  3.87 - 5.11 MIL/uL   Hemoglobin 13.3  12.0 - 15.0 g/dL   HCT 45.4  09.8 - 11.9 %   MCV 88.9  78.0 - 100.0 fL   MCH 30.9  26.0 - 34.0 pg   MCHC 34.7  30.0 - 36.0 g/dL   RDW 14.7  82.9 - 56.2 %   Platelets 254  150 - 400 K/uL   Neutrophils Relative 76  43 - 77 %   Lymphocytes Relative 10 (*) 12 - 46 %   Monocytes Relative 8  3 - 12 %   Eosinophils Relative 6 (*) 0 - 5 %   Basophils Relative 0  0 - 1 %   Neutro Abs 8.5 (*) 1.7 - 7.7 K/uL   Lymphs Abs 1.1  0.7 - 4.0 K/uL   Monocytes Absolute 0.9  0.1 - 1.0 K/uL   Eosinophils Absolute 0.7  0.0 - 0.7 K/uL   Basophils Absolute 0.0  0.0 - 0.1 K/uL   Smear Review MORPHOLOGY UNREMARKABLE    COMPREHENSIVE METABOLIC PANEL      Component Value Range   Sodium 139  135 - 145 mEq/L   Potassium 4.1  3.5 - 5.1 mEq/L   Chloride 102  96 - 112 mEq/L   CO2 28  19 - 32 mEq/L   Glucose, Bld 105 (*) 70 - 99 mg/dL   BUN 15  6 - 23 mg/dL   Creatinine, Ser 1.30  0.50 - 1.10 mg/dL   Calcium 8.8  8.4 - 86.5 mg/dL   Total Protein 7.1  6.0 - 8.3 g/dL   Albumin 3.0 (*) 3.5 - 5.2 g/dL   AST 18  0 - 37 U/L   ALT 11  0 - 35 U/L   Alkaline Phosphatase 69  39 - 117 U/L   Total Bilirubin 0.3  0.3 - 1.2 mg/dL   GFR calc non Af Amer 50 (*) >90 mL/min   GFR calc Af Amer 58 (*) >90 mL/min  URINALYSIS, ROUTINE W REFLEX MICROSCOPIC      Component Value Range   Color, Urine YELLOW  YELLOW   APPearance CLEAR  CLEAR   Specific Gravity, Urine 1.019  1.005 - 1.030   pH 5.0  5.0 - 8.0   Glucose, UA NEGATIVE  NEGATIVE mg/dL   Hgb  urine dipstick TRACE (*) NEGATIVE   Bilirubin Urine NEGATIVE  NEGATIVE   Ketones, ur NEGATIVE  NEGATIVE mg/dL   Protein, ur NEGATIVE  NEGATIVE mg/dL   Urobilinogen, UA 0.2  0.0 - 1.0 mg/dL   Nitrite NEGATIVE  NEGATIVE   Leukocytes, UA NEGATIVE  NEGATIVE  LACTIC ACID, PLASMA      Component Value Range   Lactic Acid, Venous 1.3  0.5 - 2.2 mmol/L  LIPASE, BLOOD      Component Value Range   Lipase 56  11 - 59 U/L  TROPONIN I      Component Value Range   Troponin I <0.30  <0.30 ng/mL  URINE MICROSCOPIC-ADD ON  Component Value Range   Squamous Epithelial / LPF RARE  RARE   WBC, UA 0-2  <3 WBC/hpf   RBC / HPF 0-2  <3 RBC/hpf   Bacteria, UA RARE  RARE   Urine-Other MUCOUS PRESENT     Dg Chest 2 View  01/05/2012  *RADIOLOGY REPORT*  Clinical Data: Fever, cough, back pain  CHEST - 2 VIEW  Comparison: 09/24/2010  Findings: Cardiomediastinal silhouette is stable.  Diffuse bilateral reticulonodular interstitial prominence again noted. There is  superimposed nodular consolidation in the lingula. Although this may be due to pneumonia infiltrative process cannot be excluded.  Follow-up to complete resolution after treatment is recommended.  IMPRESSION: Diffuse bilateral reticulonodular interstitial prominence again noted.  There is  superimposed nodular consolidation in the lingula.  Although this may be due to pneumonia infiltrative process cannot be excluded.  Follow-up to complete resolution after treatment is recommended.   Original Report Authenticated By: Natasha Mead, M.D.    US Abdomen Complete  01/05/2012  *RADIOLOGY REPORT*  Clinical Data:  Nausea, diarrhea, cough and fever.  COMPLETE ABDOMINAL ULTRASOUND  Comparison:  Chest CT 06/21/2009.  Findings:  Gallbladder:  Exam limited.  No obvious stones or gallbladder wall thickening.  The patient was not tender over this region during scanning per ultrasound technologist.  Common bile duct:  3.0 mm.  Liver:  1.3 cm cyst right lobe liver.  IVC:   Limited evaluation.  Pancreas:  Limited evaluation.  Spleen:  Limited evaluation.  Measures up to 6.2 cm.  No obvious mass.  Right Kidney:  10.0 cm.  Evaluation limited.  No obvious mass or hydronephrosis.  Left Kidney:  9.1 cm.  Evaluation limited.  No obvious mass or hydronephrosis.  Abdominal aorta:  Evaluation limited.  Portion visualized measures up to 1.8 cm.  IMPRESSION: Examination is limited by patient's inability to take deep breaths and large amount of bowel gas as per ultrasound technologist.  No obvious gallstones or gallbladder wall thickening.  1.3 cm liver cyst.   Original Report Authenticated By: Lacy Duverney, M.D.       Richardean Canal, MD 01/05/12 [phone]

## 2012-01-05 NOTE — ED Notes (Signed)
Via [facility]link-spoke with Mackenzie Peterson

## 2012-01-05 NOTE — ED Notes (Signed)
Pt. Family member has left and will return.

## 2012-01-05 NOTE — Progress Notes (Signed)
PCP: Tripp 87/F [facility] with cough, fever Vitals stable, CXR with pneumonia, community acquired Accepted top medsurg bed, team 10  Zannie Cove [phone]

## 2012-01-06 ENCOUNTER — Encounter (HOSPITAL_COMMUNITY): Payer: Self-pay | Admitting: *Deleted

## 2012-01-06 DIAGNOSIS — J189 Pneumonia, unspecified organism: Secondary | ICD-10-CM | POA: Diagnosis present

## 2012-01-06 DIAGNOSIS — F0390 Unspecified dementia without behavioral disturbance: Secondary | ICD-10-CM | POA: Diagnosis present

## 2012-01-06 LAB — STREP PNEUMONIAE URINARY ANTIGEN: Strep Pneumo Urinary Antigen: NEGATIVE

## 2012-01-06 LAB — BASIC METABOLIC PANEL
CO2: 24 mEq/L (ref 19–32)
Calcium: 7.7 mg/dL — ABNORMAL LOW (ref 8.4–10.5)
Glucose, Bld: 84 mg/dL (ref 70–99)
Potassium: 4.1 mEq/L (ref 3.5–5.1)
Sodium: 141 mEq/L (ref 135–145)

## 2012-01-06 LAB — CBC
Hemoglobin: 10.8 g/dL — ABNORMAL LOW (ref 12.0–15.0)
MCH: 29.8 pg (ref 26.0–34.0)
MCV: 92.3 fL (ref 78.0–100.0)
RBC: 3.62 MIL/uL — ABNORMAL LOW (ref 3.87–5.11)

## 2012-01-06 NOTE — Evaluation (Signed)
Physical Therapy Evaluation Patient Details Name: Mackenzie Peterson MRN: [id number] DOB: 1924/09/01 Today's Date: 01/06/2012 Time: 1478-2956 PT Time Calculation (min): 31 min  PT Assessment / Plan / Recommendation Clinical Impression  Pt. was admitted with SOB likely PNA, abdominal pain, nausea and diarrhea, leukocytosis.  She presents to PT with decreased independence in her mobility and gait and will benefit from acute PT to address these issues.  She did desat on room air in her walk to the bathroom and will need O2 for mobility.  Anticipate she will return home under daughter's care, but will likely benefit from HHPT.    PT Assessment  Patient needs continued PT services    Follow Up Recommendations  Home health PT;Supervision/Assistance - 24 hour;Supervision for mobility/OOB    Does the patient have the potential to tolerate intense rehabilitation      Barriers to Discharge None As long as daughter is able to continue to provide 24 hour assist    Equipment Recommendations  None recommended by PT    Recommendations for Other Services     Frequency Min 3X/week    Precautions / Restrictions Precautions Precautions: Fall Restrictions Weight Bearing Restrictions: No   Pertinent Vitals/Pain No pain reported; O2 sats at rest on 2L o2=97%; decreased to 86% on room air in walking to bathroom; increased to 92% with reapplication of O2 in recliner chair.      Mobility  Bed Mobility Bed Mobility: Supine to Sit;Sit to Supine Supine to Sit: 4: Min guard;With rails;HOB elevated Sit to Supine: Not Tested (comment) Details for Bed Mobility Assistance: Pt. needed increased time and initially seemed confused as to how to achieve task, but had just  awoken, seemed to clear as time went by. Transfers Transfers: Sit to Stand;Stand to Sit Sit to Stand: 4: Min assist;From bed;With upper extremity assist;From toilet Stand to Sit: 4: Min assist;To chair/3-in-1;To toilet;With upper extremity  assist Details for Transfer Assistance: Pt. needed cues for safe hand placement as she tends to reach for RW to stand.   Ambulation/Gait Ambulation/Gait Assistance: 4: Min assist Ambulation Distance (Feet): 20 Feet Assistive device: Rolling walker Ambulation/Gait Assistance Details: Pt. needed min assist for stability and safety as well as to negotiate RW in room.  She did urinate in the "hat" and RN Harriett Sine was made aware. Gait Pattern: Step-through pattern;Decreased step length - right;Decreased step length - left;Trunk flexed Stairs: No Wheelchair Mobility Wheelchair Mobility: No    Shoulder Instructions     Exercises     PT Diagnosis: Difficulty walking;Generalized weakness  PT Problem List: Decreased activity tolerance;Decreased balance;Decreased mobility;Decreased knowledge of use of DME;Cardiopulmonary status limiting activity PT Treatment Interventions: DME instruction;Gait training;Functional mobility training;Therapeutic activities;Therapeutic exercise;Balance training;Patient/family education   PT Goals Acute Rehab PT Goals PT Goal Formulation: Patient unable to participate in goal setting Time For Goal Achievement: 01/13/12 Potential to Achieve Goals: Good Pt will go Supine/Side to Sit: with modified independence PT Goal: Supine/Side to Sit - Progress: Goal set today Pt will go Sit to Supine/Side: with modified independence PT Goal: Sit to Supine/Side - Progress: Goal set today Pt will go Sit to Stand: with modified independence PT Goal: Sit to Stand - Progress: Goal set today Pt will go Stand to Sit: with modified independence PT Goal: Stand to Sit - Progress: Goal set today Pt will Transfer Bed to Chair/Chair to Bed: with supervision PT Transfer Goal: Bed to Chair/Chair to Bed - Progress: Goal set today Pt will Ambulate: 51 - 150 feet;with supervision;with rolling  walker PT Goal: Ambulate - Progress: Goal set today  Visit Information  Last PT Received On:  01/06/12 Assistance Needed: +1    Subjective Data  Subjective: "Thank you very much"  in regards to walking her to the bathroom Patient Stated Goal: pt. did not provide, but agreeable to getting stronger   Prior Functioning  Home Living Lives With: Daughter Available Help at Discharge: Available 24 hours/day Type of Home: House Home Adaptive Equipment: Walker - rolling Additional Comments: Pt. is limited in speaking English and answering questions that pertain to home enviornment.  RN Harriett Sine was able to provide info that pt. uses RW and is on home O2 at 2L/min.  She also lives with daughter who reportedly is attentive to her mother and is available 24/7.  Thei info will need to be verified but will operate on this infor in the meantime. Prior Function Level of Independence: Independent with assistive device(s) Vocation: Retired Musician: Prefers language other than English    Cognition  Overall Cognitive Status: Appears within functional limits for tasks assessed/performed Arousal/Alertness: Awake/alert Orientation Level: Appears intact for tasks assessed Behavior During Session: [facility] for tasks performed    Extremity/Trunk Assessment Right Upper Extremity Assessment RUE ROM/Strength/Tone: [facility] for tasks assessed Left Upper Extremity Assessment LUE ROM/Strength/Tone: [facility] for tasks assessed Right Lower Extremity Assessment RLE ROM/Strength/Tone: [facility] for tasks assessed Left Lower Extremity Assessment LLE ROM/Strength/Tone: WFL for tasks assessed Trunk Assessment Trunk Assessment: Normal   Balance    End of Session PT - End of Session Equipment Utilized During Treatment: Gait belt Activity Tolerance: Patient limited by fatigue;Patient tolerated treatment well Patient left: in chair;with call bell/phone within reach;with chair alarm set;with nursing in room Nurse Communication: Mobility status  GP     Ferman Hamming 01/06/2012, 8:50 AM Weldon Picking  PT Acute Rehab Services [phone] [place] [phone]

## 2012-01-06 NOTE — Progress Notes (Signed)
[facility] PROGRESS NOTE  Linder Prajapati ZOX:[id number] DOB: 1924-11-14 DOA: 01/05/2012 PCP: [name], MD  Assessment/Plan:  1. SHORTNESS OF BREATH (SOB)  - this is most likely secondary to pneumonia as indicated by symptoms and CXR findings  - continue ABX: Rocephin and Zithromax  - sputum gram stain and culture obtain, as well as urine Legionella and Strep Pneumo  - blood cultures obtained in [facility]    2. Abdominal pain with nausea and diarrhea  - unclear etiology at this time and possibly related to principal problem  - will provide supportive care with IVF for 10-12 hours, analgesia and antiemetics as needed  - Abdominal US unremarkable for acute etiology  - resolved by 01/06/12  3. ASTHMA  - will monitor oxygen saturation on medical floor  - provide albuterol inhaler as per home medication regimen   4. Leukocytosis  - likely secondary to an infectious etiology PNA as noted above  - provide ABX and follow up on microbiology studies  -resolved by 01/06/12   5. HYPOTHYROIDISM  - stable, continue Synthroid   6. Dementia  - would continue Namenda and Seroquel     Code Status: full  Family Communication: patient  Disposition Plan: home   Antibiotics:  Rocephin  Zithromax   HPI/Subjective: Says she feels a little better   Objective: Filed Vitals:   01/05/12 1607 01/05/12 1807 01/05/12 1951 01/06/12 0513  BP: 105/46 107/43 109/42 107/46  Pulse: 70 78 85 63  Temp:   97.4 F (36.3 C) 97.7 F (36.5 C)  TempSrc:   Axillary Oral  Resp: 30 24 21 18   Height:   5' (1.524 m)   Weight:   57.698 kg (127 lb 3.2 oz)   SpO2: 98% 98% 92% 97%   No intake or output data in the 24 hours ending 01/06/12 0829 Filed Weights   01/05/12 1951  Weight: 57.698 kg (127 lb 3.2 oz)    Patient Vitals for the past 24 hrs:  BP Temp Temp src Pulse Resp SpO2 Height Weight  01/06/12 0513 107/46 mmHg 97.7 F (36.5 C) Oral 63  18  97 % - -  01/05/12 1951 109/42 mmHg  97.4 F (36.3 C) Axillary 85  21  92 % 5' (1.524 m) 57.698 kg (127 lb 3.2 oz)  01/05/12 1807 107/43 mmHg - - 78  24  98 % - -  01/05/12 1607 105/46 mmHg - - 70  30  98 % - -  01/05/12 1426 - 100.1 F (37.8 C) Rectal - - 95 % - -  01/05/12 1355 128/52 mmHg 98.6 F (37 C) Oral 94  48  94 % - -    Exam:   General:  Alert, calm, following commands   Cardiovascular: RRR, no M,R,G  Respiratory: bibasilar crackles  Abdomen: soft, nt   Data Reviewed: Basic Metabolic Panel:  Lab 01/06/12 8119 01/05/12 1422  NA 141 139  K 4.1 4.1  CL 107 102  CO2 24 28  GLUCOSE 84 105*  BUN 9 15  CREATININE 0.87 1.00  CALCIUM 7.7* 8.8  MG -- --  PHOS -- --   Liver Function Tests:  Lab 01/05/12 1422  AST 18  ALT 11  ALKPHOS 69  BILITOT 0.3  PROT 7.1  ALBUMIN 3.0*    Lab 01/05/12 1422  LIPASE 56  AMYLASE --   No results found for this basename: AMMONIA:5 in the last 168 hours CBC:  Lab 01/06/12 0600 01/05/12 1422  WBC 10.3  11.2*  NEUTROABS -- 8.5*  HGB 10.8* 13.3  HCT 33.4* 38.3  MCV 92.3 88.9  PLT 262 254   Cardiac Enzymes:  Lab 01/05/12 1422  CKTOTAL --  CKMB --  CKMBINDEX --  TROPONINI <0.30   BNP (last 3 results) No results found for this basename: PROBNP:3 in the last 8760 hours CBG: No results found for this basename: GLUCAP:5 in the last 168 hours  No results found for this or any previous visit (from the past 240 hour(s)).   Studies: Dg Chest 2 View  01/05/2012  *RADIOLOGY REPORT*  Clinical Data: Fever, cough, back pain  CHEST - 2 VIEW  Comparison: 09/24/2010  Findings: Cardiomediastinal silhouette is stable.  Diffuse bilateral reticulonodular interstitial prominence again noted. There is  superimposed nodular consolidation in the lingula. Although this may be due to pneumonia infiltrative process cannot be excluded.  Follow-up to complete resolution after treatment is recommended.  IMPRESSION: Diffuse bilateral reticulonodular interstitial prominence again  noted.  There is  superimposed nodular consolidation in the lingula.  Although this may be due to pneumonia infiltrative process cannot be excluded.  Follow-up to complete resolution after treatment is recommended.   Original Report Authenticated By: Natasha Mead, M.D.    US Abdomen Complete  01/05/2012  *RADIOLOGY REPORT*  Clinical Data:  Nausea, diarrhea, cough and fever.  COMPLETE ABDOMINAL ULTRASOUND  Comparison:  Chest CT 06/21/2009.  Findings:  Gallbladder:  Exam limited.  No obvious stones or gallbladder wall thickening.  The patient was not tender over this region during scanning per ultrasound technologist.  Common bile duct:  3.0 mm.  Liver:  1.3 cm cyst right lobe liver.  IVC:  Limited evaluation.  Pancreas:  Limited evaluation.  Spleen:  Limited evaluation.  Measures up to 6.2 cm.  No obvious mass.  Right Kidney:  10.0 cm.  Evaluation limited.  No obvious mass or hydronephrosis.  Left Kidney:  9.1 cm.  Evaluation limited.  No obvious mass or hydronephrosis.  Abdominal aorta:  Evaluation limited.  Portion visualized measures up to 1.8 cm.  IMPRESSION: Examination is limited by patient's inability to take deep breaths and large amount of bowel gas as per ultrasound technologist.  No obvious gallstones or gallbladder wall thickening.  1.3 cm liver cyst.   Original Report Authenticated By: Lacy Duverney, M.D.     Scheduled Meds:    . [COMPLETED] sodium chloride  1,000 mL Intravenous Once  . sodium chloride   Intravenous STAT  . aspirin EC  81 mg Oral Daily  . [COMPLETED] azithromycin (ZITHROMAX) 500 MG IVPB  500 mg Intravenous Once  . azithromycin  500 mg Oral Q24H  . [COMPLETED] cefTRIAXone (ROCEPHIN)  IV  1 g Intravenous Once  . cefTRIAXone (ROCEPHIN)  IV  1 g Intravenous Q24H  . darifenacin  7.5 mg Oral Daily  . enoxaparin (LOVENOX) injection  30 mg Subcutaneous Q24H  . escitalopram  20 mg Oral Daily  . irbesartan  75 mg Oral Daily  . levothyroxine  25 mcg Oral QAC breakfast  . loratadine   10 mg Oral Daily  . memantine  10 mg Oral BID  . [COMPLETED]  morphine injection  4 mg Intravenous Once  . neomycin-polymyxin-hydrocortisone  3 drop Both Ears QID  . [COMPLETED] ondansetron (ZOFRAN) IV  4 mg Intravenous Once  . QUEtiapine  50 mg Oral QHS  . simvastatin  10 mg Oral q1800  . sodium chloride  3 mL Intravenous Q12H  . traZODone  50 mg Oral QHS  . [  DISCONTINUED] aspirin  81 mg Oral Daily   Continuous Infusions:    . [DISCONTINUED] sodium chloride      Principal Problem:  *SHORTNESS OF BREATH (SOB) Active Problems:  HYPOTHYROIDISM  ASTHMA  Leukocytosis  Abdominal pain     Maryon Kemnitz  [facility] Pager [phone]. If 8PM-8AM, please contact night-coverage at [email or web address], password [facility] 01/06/2012, 8:29 AM  LOS: 1 day

## 2012-01-07 LAB — URINE CULTURE
Colony Count: NO GROWTH
Culture: NO GROWTH

## 2012-01-07 LAB — LEGIONELLA ANTIGEN, URINE: Legionella Antigen, Urine: NEGATIVE

## 2012-01-07 MED ORDER — SIMVASTATIN 10 MG PO TABS: 10.0000 mg | ORAL_TABLET | Freq: Every day | ORAL | Status: DC

## 2012-01-07 MED ORDER — DONEPEZIL HCL 10 MG PO TABS
10.0000 mg | ORAL_TABLET | Freq: Every day | ORAL | Status: DC
  Administered 2012-01-07: 10 mg via ORAL
  Filled 2012-01-07 (×2): qty 1

## 2012-01-07 MED ORDER — ESCITALOPRAM OXALATE 20 MG PO TABS: 20.0000 mg | ORAL_TABLET | Freq: Every day | ORAL | Status: DC

## 2012-01-07 MED ORDER — TRAZODONE HCL 50 MG PO TABS
50.0000 mg | ORAL_TABLET | Freq: Every day | ORAL | Status: DC
  Administered 2012-01-07: 50 mg via ORAL
  Filled 2012-01-07 (×2): qty 1

## 2012-01-07 MED ORDER — ALPRAZOLAM 0.25 MG PO TABS
0.2500 mg | ORAL_TABLET | Freq: Two times a day (BID) | ORAL | Status: DC
  Administered 2012-01-07 – 2012-01-08 (×2): 0.25 mg via ORAL
  Filled 2012-01-07 (×2): qty 1

## 2012-01-07 NOTE — Progress Notes (Signed)
[facility] PROGRESS NOTE  Mackenzie Peterson ZOX:[id number] DOB: 1925-01-19 DOA: 01/05/2012 PCP: [name], MD  Assessment/Plan:  1. SHORTNESS OF BREATH (SOB)  - this is most likely secondary to pneumonia as indicated by symptoms and CXR findings  - continue ABX: Rocephin and Zithromax  - sputum gram stain and culture obtain, as well as urine Legionella and Strep Pneumo  - blood cultures obtained in [facility]    2. Abdominal pain with nausea and diarrhea  - unclear etiology at this time and possibly related to principal problem  - will provide supportive care with IVF for 10-12 hours, analgesia and antiemetics as needed  - Abdominal US unremarkable for acute etiology  - resolved by 01/06/12  3. ASTHMA  - will monitor oxygen saturation on medical floor  - provide albuterol inhaler as per home medication regimen   4. Leukocytosis  - likely secondary to an infectious etiology PNA as noted above  - provide ABX and follow up on microbiology studies  -resolved by 01/06/12   5. HYPOTHYROIDISM  - stable, continue Synthroid   6. Dementia  - would continue Namenda and Seroquel     Code Status: full  Family Communication: patient  Disposition Plan: home   Antibiotics:  Rocephin  Zithromax   HPI/Subjective: Says she feels a little better   Objective: Filed Vitals:   01/06/12 1400 01/06/12 1800 01/06/12 2043 01/07/12 0503  BP: 122/50 123/56 112/48 108/41  Pulse: 75 85 73 74  Temp: 98.2 F (36.8 C) 99.1 F (37.3 C) 98.2 F (36.8 C) 98.5 F (36.9 C)  TempSrc: Oral Oral Oral Oral  Resp: 18 18 19 18   Height:      Weight:   57.834 kg (127 lb 8 oz)   SpO2: 92% 96% 98% 93%    Intake/Output Summary (Last 24 hours) at 01/07/12 0817 Last data filed at 01/06/12 2232  Gross per 24 hour  Intake    480 ml  Output    500 ml  Net    -20 ml   Filed Weights   01/05/12 1951 01/06/12 2043  Weight: 57.698 kg (127 lb 3.2 oz) 57.834 kg (127 lb 8 oz)    Patient Vitals  for the past 24 hrs:  BP Temp Temp src Pulse Resp SpO2 Weight  01/07/12 0503 108/41 mmHg 98.5 F (36.9 C) Oral 74  18  93 % -  01/06/12 2043 112/48 mmHg 98.2 F (36.8 C) Oral 73  19  98 % 57.834 kg (127 lb 8 oz)  01/06/12 1800 123/56 mmHg 99.1 F (37.3 C) Oral 85  18  96 % -  01/06/12 1400 122/50 mmHg 98.2 F (36.8 C) Oral 75  18  92 % -  01/06/12 1035 110/41 mmHg 98.5 F (36.9 C) Oral 68  18  96 % -    Exam:   General:  Alert, calm, following commands   Cardiovascular: RRR, no M,R,G  Respiratory: no w,r, minimal bibasilar crackles  Abdomen: soft, nt   Data Reviewed: Basic Metabolic Panel:  Lab 01/06/12 8119 01/05/12 1422  NA 141 139  K 4.1 4.1  CL 107 102  CO2 24 28  GLUCOSE 84 105*  BUN 9 15  CREATININE 0.87 1.00  CALCIUM 7.7* 8.8  MG -- --  PHOS -- --   Liver Function Tests:  Lab 01/05/12 1422  AST 18  ALT 11  ALKPHOS 69  BILITOT 0.3  PROT 7.1  ALBUMIN 3.0*    Lab 01/05/12 1422  LIPASE 56  AMYLASE --   No results found for this basename: AMMONIA:5 in the last 168 hours CBC:  Lab 01/06/12 0600 01/05/12 1422  WBC 10.3 11.2*  NEUTROABS -- 8.5*  HGB 10.8* 13.3  HCT 33.4* 38.3  MCV 92.3 88.9  PLT 262 254   Cardiac Enzymes:  Lab 01/05/12 1422  CKTOTAL --  CKMB --  CKMBINDEX --  TROPONINI <0.30   BNP (last 3 results) No results found for this basename: PROBNP:3 in the last 8760 hours CBG: No results found for this basename: GLUCAP:5 in the last 168 hours  No results found for this or any previous visit (from the past 240 hour(s)).   Studies: Dg Chest 2 View  01/05/2012  *RADIOLOGY REPORT*  Clinical Data: Fever, cough, back pain  CHEST - 2 VIEW  Comparison: 09/24/2010  Findings: Cardiomediastinal silhouette is stable.  Diffuse bilateral reticulonodular interstitial prominence again noted. There is  superimposed nodular consolidation in the lingula. Although this may be due to pneumonia infiltrative process cannot be excluded.  Follow-up to  complete resolution after treatment is recommended.  IMPRESSION: Diffuse bilateral reticulonodular interstitial prominence again noted.  There is  superimposed nodular consolidation in the lingula.  Although this may be due to pneumonia infiltrative process cannot be excluded.  Follow-up to complete resolution after treatment is recommended.   Original Report Authenticated By: Natasha Mead, M.D.    US Abdomen Complete  01/05/2012  *RADIOLOGY REPORT*  Clinical Data:  Nausea, diarrhea, cough and fever.  COMPLETE ABDOMINAL ULTRASOUND  Comparison:  Chest CT 06/21/2009.  Findings:  Gallbladder:  Exam limited.  No obvious stones or gallbladder wall thickening.  The patient was not tender over this region during scanning per ultrasound technologist.  Common bile duct:  3.0 mm.  Liver:  1.3 cm cyst right lobe liver.  IVC:  Limited evaluation.  Pancreas:  Limited evaluation.  Spleen:  Limited evaluation.  Measures up to 6.2 cm.  No obvious mass.  Right Kidney:  10.0 cm.  Evaluation limited.  No obvious mass or hydronephrosis.  Left Kidney:  9.1 cm.  Evaluation limited.  No obvious mass or hydronephrosis.  Abdominal aorta:  Evaluation limited.  Portion visualized measures up to 1.8 cm.  IMPRESSION: Examination is limited by patient's inability to take deep breaths and large amount of bowel gas as per ultrasound technologist.  No obvious gallstones or gallbladder wall thickening.  1.3 cm liver cyst.   Original Report Authenticated By: Lacy Duverney, M.D.     Scheduled Meds:    . [COMPLETED] sodium chloride   Intravenous STAT  . ALPRAZolam  0.25 mg Oral BID  . aspirin EC  81 mg Oral Daily  . azithromycin  500 mg Oral Q24H  . cefTRIAXone (ROCEPHIN)  IV  1 g Intravenous Q24H  . donepezil  10 mg Oral QHS  . enoxaparin (LOVENOX) injection  30 mg Subcutaneous Q24H  . escitalopram  20 mg Oral Daily  . levothyroxine  25 mcg Oral QAC breakfast  . loratadine  10 mg Oral Daily  . memantine  10 mg Oral BID  . QUEtiapine   50 mg Oral QHS  . simvastatin  10 mg Oral q1800  . sodium chloride  3 mL Intravenous Q12H  . traZODone  50 mg Oral QHS  . [DISCONTINUED] darifenacin  7.5 mg Oral Daily  . [DISCONTINUED] escitalopram  20 mg Oral QHS  . [DISCONTINUED] irbesartan  75 mg Oral Daily  . [DISCONTINUED] neomycin-polymyxin-hydrocortisone  3 drop Both Ears  QID  . [DISCONTINUED] simvastatin  10 mg Oral q1800  . [DISCONTINUED] traZODone  50 mg Oral QHS   Continuous Infusions:    Principal Problem:  *Pneumonia Active Problems:  HYPOTHYROIDISM  DEPRESSION  ASTHMA  SHORTNESS OF BREATH (SOB)  Leukocytosis  Abdominal pain  Dementia     Deya Bigos  [facility] Pager [phone]. If 8PM-8AM, please contact night-coverage at [email or web address], password [facility] [place] 01/07/2012, 8:17 AM  LOS: 2 days

## 2012-01-08 MED ORDER — LEVOFLOXACIN 500 MG PO TABS: 500.0000 mg | ORAL_TABLET | Freq: Every day | ORAL | Status: DC

## 2012-01-08 MED ORDER — OMEPRAZOLE 20 MG PO CPDR: 20.0000 mg | DELAYED_RELEASE_CAPSULE | Freq: Every day | ORAL | Status: DC

## 2012-01-08 NOTE — Progress Notes (Signed)
[facility]  [phone]  Home O2 provider prior to admission Called with order for continuous O2, faxed order, H&P, DC summary and O2 sat readings  They will deliver portable tank to hospital for transport home

## 2012-01-08 NOTE — Progress Notes (Signed)
   CARE MANAGEMENT NOTE 01/08/2012  Patient:  Mackenzie Peterson,Mackenzie Peterson   Account Number:  [id number]  Date Initiated:  01/08/2012  Documentation initiated by:  Darlyne Russian  Subjective/Objective Assessment:   Patient admitted with shortness of breath     Action/Plan:   Progression of care and discharge planning   Anticipated DC Date:  01/08/2012   Anticipated DC Plan:  HOME W HOME HEALTH SERVICES      DC Planning Services  CM consult      [facility] Choice  HOME HEALTH   Choice offered to / List presented to:  C-4 Adult Children        HH arranged  HH-2 PT  HH-1 RN      [facility] agency  [facility]   Status of service:  Completed, signed off Medicare Important Message given?   (If response is "NO", the following Medicare IM given date fields will be blank) Date Medicare IM given:   Date Additional Medicare IM given:    Discharge Disposition:    Per UR Regulation:    If discussed at Long Length of Stay Meetings, dates discussed:    Comments:  01/08/2012  [address] RN, [place]  [phone] CM referral:  HH RN, PT  Met with daughter in law Vianne Bulls regarding home health services. She selected [facility].  [facility] called with referral.

## 2012-01-08 NOTE — Progress Notes (Signed)
Physical Therapy Treatment Patient Details Name: Mackenzie Peterson MRN: [id number] DOB: 1924/10/27 Today's Date: 01/08/2012 Time: 4098-1191 PT Time Calculation (min): 26 min  PT Assessment / Plan / Recommendation Comments on Treatment Session  Progressing well with ambulation and activity tol; Will need supplemental O2 in the home as she desatted with amb on Room Air; Noted for dc today, and PT in agreement, agree with HHPT follow up for gait, strengthening, safety and activty tol    Follow Up Recommendations  Home health PT;Supervision/Assistance - 24 hour;Supervision for mobility/OOB     Does the patient have the potential to tolerate intense rehabilitation     Barriers to Discharge        Equipment Recommendations  Other (comment) (Oxygen)    Recommendations for Other Services    Frequency Min 3X/week   Plan Discharge plan remains appropriate    Precautions / Restrictions Precautions Precautions: Fall   Pertinent Vitals/Pain Please see previous Brief PT note for Supplemental O2 Qualifications    Mobility  Bed Mobility Bed Mobility: Not assessed (Presented to PT in recliner) Transfers Transfers: Sit to Stand;Stand to Sit Sit to Stand: 4: Min guard;From chair/3-in-1;With upper extremity assist Stand to Sit: 4: Min guard;To chair/3-in-1;With upper extremity assist Details for Transfer Assistance: Pt. needed cues for safe hand placement as she tends to reach for RW to stand.   Ambulation/Gait Ambulation/Gait Assistance: 4: Min guard (with and without physical contact) Ambulation Distance (Feet): 100 Feet Assistive device: Rolling walker Ambulation/Gait Assistance Details: Mostly cues for self-monitor for activity tolerance; Pt indicated that the distance we ambulated today is compatible with distances she walks in the home Gait Pattern: Step-through pattern;Decreased step length - right;Decreased step length - left;Trunk flexed Stairs:  (Pt foresees no problems with her "small"  steps to enter her home     Exercises     PT Diagnosis:    PT Problem List:   PT Treatment Interventions:     PT Goals Acute Rehab PT Goals Time For Goal Achievement: 01/13/12 Potential to Achieve Goals: Good Pt will go Supine/Side to Sit: with modified independence Pt will go Sit to Stand: with modified independence PT Goal: Sit to Stand - Progress: Progressing toward goal Pt will go Stand to Sit: with modified independence PT Goal: Stand to Sit - Progress: Progressing toward goal Pt will Ambulate: 51 - 150 feet;with supervision;with rolling walker PT Goal: Ambulate - Progress: Progressing toward goal  Visit Information  Last PT Received On: 01/08/12 Assistance Needed: +1    Subjective Data  Subjective: Happy to be going home Patient Stated Goal: pt. did not provide, but agreeable to getting stronger   Cognition  Overall Cognitive Status: Appears within functional limits for tasks assessed/performed Arousal/Alertness: Awake/alert Orientation Level: Appears intact for tasks assessed Behavior During Session: [facility] for tasks performed    Balance     End of Session PT - End of Session Equipment Utilized During Treatment: Oxygen Activity Tolerance: Patient tolerated treatment well Patient left: in chair;with call bell/phone within reach Nurse Communication: Mobility status   GP     Olen Pel [place], [place] [phone]  01/08/2012, 2:47 PM

## 2012-01-08 NOTE — Discharge Summary (Addendum)
Physician Discharge Summary  Mackenzie Peterson EAV:[id number] DOB: 1924/04/21 DOA: 01/05/2012  PCP: [name], MD  Admit date: 01/05/2012 Discharge date: 01/08/2012  Time spent:  minutes  Recommendations for Outpatient Follow-up:  1. Needs follow up chest x ray to assure resolution of pneumonia  Discharge Diagnoses:  Pneumonia Acute on chronic respiratory failure - stas 86% room air - patient placed on 2 liters of oxygen 24 hrs/day   HYPOTHYROIDISM  DEPRESSION  ASTHMA  Leukocytosis - resolved   Abdominal pain - resolved   Dementia   Discharge Condition: fair  Diet recommendation: regular  Filed Weights   01/05/12 1951 01/06/12 2043 01/07/12 2222  Weight: 57.698 kg (127 lb 3.2 oz) 57.834 kg (127 lb 8 oz) 56.201 kg (123 lb 14.4 oz)    History of present illness:  Pt is 78 yo female who initially presented to [facility] with main concern of progressively worsening shortness of breath associated with subjective fevers, chills, productive cough of yellow sputum, nausea, non watery diarrhea, poor oral intake. This initially started 5 days prior to admission and per pt with no specific alleviating or aggravating factors. Pt did however notice shortness of breath was worse with exertion initially but now also present at rest. She denies recent similar events. Pt also reports generalized abdominal pain, mostly located in the right upper quadrant area but occasionally radiating to bilateral flank areas and lower abdominal quadrants   Hospital Course:   1 . SHORTNESS OF BREATH (SOB)  - this is most likely secondary to pneumonia as indicated by symptoms and CXR findings  - the patient received  Rocephin and Zithromax during admission and she was changed to Levaquin for 5 more days at DC  - sputum gram stain and culture obtain, as well as urine Legionella and Strep Pneumo - but there was no growth.   2. Abdominal pain with nausea and diarrhea  - unclear etiology at this time and possibly  related to principal problem  - patient received supportive care with IVF for 10-12 hours, analgesia and antiemetics as needed and symptoms improved  - Abdominal US unremarkable for acute etiology  - resolved by 01/06/12  3. ASTHMA  - will monitor oxygen saturation on medical floor  - provide albuterol inhaler as per home medication regimen  - patient on nocturnal oxygen prior to admission - she is requiring now 24 hrs oxygen - patient to follow up with pulm post discharge    4. Leukocytosis  - likely secondary to an infectious etiology PNA as noted above  -resolved by 01/06/12  5. HYPOTHYROIDISM  - stable, continue Synthroid  6. Dementia  Continued on  Namenda and Seroquel - patient calm and cooperative and oriented x2.     Procedures: none Consultations:  None     Discharge Exam: Filed Vitals:   01/08/12 0548 01/08/12 0753 01/08/12 1016 01/08/12 1017  BP: 140/65 131/69    Pulse: 66 66    Temp: 97.7 F (36.5 C) 98.5 F (36.9 C)    TempSrc: Oral Oral    Resp: 17 17    Height:      Weight:      SpO2: 92% 92% 90% 95%    General: axox2 Cardiovascular: rrr Respiratory: min crackles at bases   Discharge Instructions      Discharge Orders    Future Orders Please Complete By Expires   Diet - low sodium heart healthy      Increase activity slowly  Medication List     As of 01/08/2012  2:48 PM    TAKE these medications         albuterol (2.5 MG/3ML) 0.083% nebulizer solution   Commonly known as: PROVENTIL   Take 2.5 mg by nebulization every 6 (six) hours as needed. For wheezing or shortness of breath      ALPRAZolam 0.5 MG tablet   Commonly known as: XANAX   Take 0.5 mg by mouth 2 (two) times daily.      aspirin 81 MG tablet   Take 81 mg by mouth daily.      cetirizine 10 MG tablet   Commonly known as: ZYRTEC   Take 10 mg by mouth at bedtime.      donepezil 10 MG tablet   Commonly known as: ARICEPT   Take 10 mg by mouth at bedtime.       escitalopram 20 MG tablet   Commonly known as: LEXAPRO   Take 20 mg by mouth at bedtime.      levofloxacin 500 MG tablet   Commonly known as: LEVAQUIN   Take 1 tablet (500 mg total) by mouth daily.      levothyroxine 25 MCG tablet   Commonly known as: SYNTHROID, LEVOTHROID   Take 25 mcg by mouth daily.      memantine 10 MG tablet   Commonly known as: NAMENDA   Take 10 mg by mouth 2 (two) times daily.      nitroGLYCERIN 0.4 MG SL tablet   Commonly known as: NITROSTAT   Place 0.4 mg under the tongue every 5 (five) minutes as needed. For chest pain      omeprazole 20 MG capsule   Commonly known as: PRILOSEC   Take 1 capsule (20 mg total) by mouth daily.      pravastatin 20 MG tablet   Commonly known as: PRAVACHOL   Take 20 mg by mouth at bedtime.      QUEtiapine 50 MG tablet   Commonly known as: SEROQUEL   Take 50 mg by mouth 2 (two) times daily.      solifenacin 5 MG tablet   Commonly known as: VESICARE   Take 10 mg by mouth at bedtime.      traZODone 50 MG tablet   Commonly known as: DESYREL   Take 50 mg by mouth at bedtime.         Follow-up Information    Call called no answer at sch desk. (please call office to have provider sent to the house for hospital follow up visit )       Schedule an appointment as soon as possible for a visit with [facility], MD. (lung doctor - needs visit for f/u cxr)    Contact information:   [address] [place] [address] [phone]           The results of significant diagnostics from this hospitalization (including imaging, microbiology, ancillary and laboratory) are listed below for reference.    Significant Diagnostic Studies: Dg Chest 2 View  01/05/2012  *RADIOLOGY REPORT*  Clinical Data: Fever, cough, back pain  CHEST - 2 VIEW  Comparison: 09/24/2010  Findings: Cardiomediastinal silhouette is stable.  Diffuse bilateral reticulonodular interstitial prominence again noted. There is  superimposed nodular  consolidation in the lingula. Although this may be due to pneumonia infiltrative process cannot be excluded.  Follow-up to complete resolution after treatment is recommended.  IMPRESSION: Diffuse bilateral reticulonodular interstitial prominence again noted.  There is  superimposed nodular consolidation in the lingula.  Although this may be due to pneumonia infiltrative process cannot be excluded.  Follow-up to complete resolution after treatment is recommended.   Original Report Authenticated By: Natasha Mead, M.D.    US Abdomen Complete  01/05/2012  *RADIOLOGY REPORT*  Clinical Data:  Nausea, diarrhea, cough and fever.  COMPLETE ABDOMINAL ULTRASOUND  Comparison:  Chest CT 06/21/2009.  Findings:  Gallbladder:  Exam limited.  No obvious stones or gallbladder wall thickening.  The patient was not tender over this region during scanning per ultrasound technologist.  Common bile duct:  3.0 mm.  Liver:  1.3 cm cyst right lobe liver.  IVC:  Limited evaluation.  Pancreas:  Limited evaluation.  Spleen:  Limited evaluation.  Measures up to 6.2 cm.  No obvious mass.  Right Kidney:  10.0 cm.  Evaluation limited.  No obvious mass or hydronephrosis.  Left Kidney:  9.1 cm.  Evaluation limited.  No obvious mass or hydronephrosis.  Abdominal aorta:  Evaluation limited.  Portion visualized measures up to 1.8 cm.  IMPRESSION: Examination is limited by patient's inability to take deep breaths and large amount of bowel gas as per ultrasound technologist.  No obvious gallstones or gallbladder wall thickening.  1.3 cm liver cyst.   Original Report Authenticated By: Lacy Duverney, M.D.     Microbiology: Recent Results (from the past 240 hour(s))  CULTURE, BLOOD (ROUTINE X 2)     Status: Normal (Preliminary result)   Collection Time   01/05/12  2:20 PM      Component Value Range Status Comment   Specimen Description BLOOD LEFT ARM   Final    Special Requests BOTTLES DRAWN AEROBIC AND ANAEROBIC 5CC EACH   Final    Culture  Setup  Time 01/06/2012 00:54   Final    Culture     Final    Value:        BLOOD CULTURE RECEIVED NO GROWTH TO DATE CULTURE WILL BE HELD FOR 5 DAYS BEFORE ISSUING A FINAL NEGATIVE REPORT   Report Status PENDING   Incomplete   CULTURE, BLOOD (ROUTINE X 2)     Status: Normal (Preliminary result)   Collection Time   01/05/12  2:43 PM      Component Value Range Status Comment   Specimen Description BLOOD LEFT WRIST   Final    Special Requests BOTTLES DRAWN AEROBIC AND ANAEROBIC 5CC EACH   Final    Culture  Setup Time 01/06/2012 00:54   Final    Culture     Final    Value:        BLOOD CULTURE RECEIVED NO GROWTH TO DATE CULTURE WILL BE HELD FOR 5 DAYS BEFORE ISSUING A FINAL NEGATIVE REPORT   Report Status PENDING   Incomplete   URINE CULTURE     Status: Normal   Collection Time   01/05/12  3:11 PM      Component Value Range Status Comment   Specimen Description URINE, CATHETERIZED   Final    Special Requests NONE   Final    Culture  Setup Time 01/06/2012 02:20   Final    Colony Count NO GROWTH   Final    Culture NO GROWTH   Final    Report Status 01/07/2012 FINAL   Final      Labs: Basic Metabolic Panel:  Lab 01/06/12 1610 01/05/12 1422  NA 141 139  K 4.1 4.1  CL 107 102  CO2 24 28  GLUCOSE 84  105*  BUN 9 15  CREATININE 0.87 1.00  CALCIUM 7.7* 8.8  MG -- --  PHOS -- --   Liver Function Tests:  Lab 01/05/12 1422  AST 18  ALT 11  ALKPHOS 69  BILITOT 0.3  PROT 7.1  ALBUMIN 3.0*    Lab 01/05/12 1422  LIPASE 56  AMYLASE --   No results found for this basename: AMMONIA:5 in the last 168 hours CBC:  Lab 01/06/12 0600 01/05/12 1422  WBC 10.3 11.2*  NEUTROABS -- 8.5*  HGB 10.8* 13.3  HCT 33.4* 38.3  MCV 92.3 88.9  PLT 262 254   Cardiac Enzymes:  Lab 01/05/12 1422  CKTOTAL --  CKMB --  CKMBINDEX --  TROPONINI <0.30   BNP: BNP (last 3 results) No results found for this basename: PROBNP:3 in the last 8760 hours CBG: No results found for this basename: GLUCAP:5 in  the last 168 hours     Signed:  Tonisha Silvey  [facility] 01/08/2012, 2:48 PM

## 2012-01-08 NOTE — Progress Notes (Signed)
Discussed discharge instructions and medications with daughter in law, who is english speaking. No barriers to discharge noted. IV removed. Assessment unchanged from morning. Pt discharged to home with daughter in law. Instructed pt to stay on O2 at all times, not just at night time, due to desaturation while ambulating.

## 2012-01-08 NOTE — Progress Notes (Signed)
Physical Therapy Note  (Brief note, full PT session note to follow)  SATURATION QUALIFICATIONS: (This note is used to comply with regulatory documentation for home oxygen)  Patient Saturations on Room Air at Rest = 90%  Patient Saturations on Room Air while Ambulating = 86%  Patient Saturations on 2 Liters of oxygen while Ambulating = 92-94%  Please briefly explain why patient needs home oxygen: Pt's O2 saturations decr to an unacceptable level with activity on [address]  Lendell Caprice [place], [place] [phone]

## 2012-01-12 LAB — CULTURE, BLOOD (ROUTINE X 2): Culture: NO GROWTH

## 2012-01-16 ENCOUNTER — Ambulatory Visit (INDEPENDENT_AMBULATORY_CARE_PROVIDER_SITE_OTHER): Payer: Medicare Other | Admitting: Internal Medicine

## 2012-01-16 ENCOUNTER — Encounter: Payer: Self-pay | Admitting: Internal Medicine

## 2012-01-16 ENCOUNTER — Ambulatory Visit (INDEPENDENT_AMBULATORY_CARE_PROVIDER_SITE_OTHER)
Admission: RE | Admit: 2012-01-16 | Discharge: 2012-01-16 | Disposition: A | Discharge status: Home / Self Care | Payer: Medicare Other | Source: Ambulatory Visit | Attending: Internal Medicine | Admitting: Internal Medicine

## 2012-01-16 ENCOUNTER — Telehealth: Payer: Self-pay | Admitting: Internal Medicine

## 2012-01-16 VITALS — BP 108/60 | HR 57 | Temp 97.9°F | Ht 61.0 in | Wt 124.0 lb

## 2012-01-16 DIAGNOSIS — J9611 Chronic respiratory failure with hypoxia: Secondary | ICD-10-CM | POA: Insufficient documentation

## 2012-01-16 DIAGNOSIS — J189 Pneumonia, unspecified organism: Secondary | ICD-10-CM

## 2012-01-16 DIAGNOSIS — R05 Cough: Secondary | ICD-10-CM

## 2012-01-16 DIAGNOSIS — J9610 Chronic respiratory failure, unspecified whether with hypoxia or hypercapnia: Secondary | ICD-10-CM | POA: Insufficient documentation

## 2012-01-16 MED ORDER — OMEPRAZOLE 20 MG PO CPDR: DELAYED_RELEASE_CAPSULE | ORAL | Status: DC

## 2012-01-16 NOTE — Telephone Encounter (Signed)
Result Note     Call pt: Reviewed cxr and no acute change so no change in recommendations made at ov   ------ Called # listed x 2 and phone would ring 3 times then would end wcb

## 2012-01-16 NOTE — Assessment & Plan Note (Addendum)
cxr reviwed, clear of acute as dz

## 2012-01-16 NOTE — Progress Notes (Signed)
Subjective:     Patient ID: Mackenzie Peterson, female   DOB: August 11, 1924, 78 y.o.   MRN: [id number]  HPI  IOV 10/26/2009/ Ramaswamy:    [place] immigrant with dementia. Never smoker but pasive smoker trhough age mid-30s.. Brought in by daughter in law Mackenzie Peterson who is interpreter. [name] states moved to [place] from [place], [place] in Jan 2011 to be with son. Had pulmonologist at [facility]. [name] also saw a DR Henrene Dodge at [facility], [place]. 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 Patient states moderate cough > 20 years. STable. Worse in morning and worse at night. Sometimes dry and sometimes mucus. Associated chronic daily GERD + and post nasal drip +. Cough made worse by "any effort" such as picking up stuff, walking. Cough relieved by relaxingand resting.  Cough is associated with dyspnea. She states she gets dyspneic walking. Dyspnea relieved by rest. Dyspnea is present for activities like changing clothes.  Only outside records available is from Dr Julio Sicks. THere are some spirometries that shows restriction and one that shows obstrictuion     Admit date: 01/05/2012  Discharge date: 01/08/2012  Time spent: minutes  Recommendations for Outpatient Follow-up:  1. Needs follow up chest x ray to assure resolution of pneumonia  Discharge Diagnoses:  Pneumonia  Acute on chronic respiratory failure - stas 86% room air - patient placed on 2 liters of oxygen 24 hrs/day  HYPOTHYROIDISM  DEPRESSION  ASTHMA  Leukocytosis - resolved  Abdominal pain - resolved  Dementia    01/16/2012  Mackenzie Peterson 37 yohispanic female  In for post hosp f/u ov cc appetite better, now 24/7 2lpm where previously on just bedtime,  Cough is back to baseline, dry daily not really interfering with sleep.  Daughter convinced neb helping up to one or twice daily - using prilosec before supper.  Sleeping ok without nocturnal  or early am exacerbation  of respiratory   c/o's or need for noct saba. Also denies any obvious fluctuation of symptoms with weather or environmental changes or other aggravating or alleviating factors except as outlined above   ROS  The following are not active complaints unless bolded sore throat, dysphagia, dental problems, itching, sneezing,  nasal congestion or excess/ purulent secretions, ear ache,   fever, chills, sweats, unintended wt loss, pleuritic or exertional cp, hemoptysis,  orthopnea pnd or leg swelling, presyncope, palpitations, heartburn, abdominal pain, anorexia, nausea, vomiting, diarrhea  or change in bowel or urinary habits, change in stools or urine, dysuria,hematuria,  rash, arthralgias, visual complaints, headache, numbness weakness or ataxia or problems with walking or coordination> w/c bound  Plus  Memory comes and goes          Past Medical History:  hypertension  arthritis  overactive bladder  alzhemer's dementia......diagnosed 2007  - "middle stage" per d-i-l.  - progressive per d-i-l  - incontinent with stool/urine. Needs diaper  depression  anxiety  lung nodule nos  - dxed in [facility]  - extensively worked up in [facility]  Hypothyroidism  Depression  Asthma     Family History:  Father-COPD, smoker, heart disease, cancer?  Mother-heart disease     Social History:  Children:1  Lives with daughter in law  Patient never smoked.  Smoking Status: never  Passive Smoke Exposure: yes     Review of Systems     Objective:   Physical Exam    w/c bound frail elderly hispanic female  lets her Grand-daughter-in-law do her speaking  HEENT: nl dentition, turbinates, and orophanx. Nl external ear canals without cough reflex   NECK :  without JVD/Nodes/TM/ nl carotid upstrokes bilaterally   LUNGS: no acc muscle use, clear to A and P bilaterally without cough on insp or exp maneuvers   CV:  RRR  no s3 or murmur or increase in P2, no edema   ABD:  soft and nontender with nl excursion in the  supine position. No bruits or organomegaly, bowel sounds nl  MS:  warm without deformities, calf tenderness, cyanosis or clubbing  SKIN: warm and dry without lesions    NEURO:  Alert intermittently knows day of week per fm    CXR  01/16/2012 :   The previously noted airspace consolidation has  cleared. There remains widespread nodular interstitial disease  with nodular lesions throughout the lungs ranging in size for a  small as 1 mm to as large as 6 mm, stable  Assessment:          Plan:

## 2012-01-16 NOTE — Assessment & Plan Note (Signed)
Mild post pna cough ? Upper airway   rec trial of max gerd rx and off fish oil or any oil based vitamin and then regoup with Dr Marchelle Gearing in 4 weeks

## 2012-01-16 NOTE — Assessment & Plan Note (Signed)
On 24 h 02 since d/c from [facility]  01/08/12  rec continue 02 @ 2lpm 24/7 for now

## 2012-01-16 NOTE — Patient Instructions (Addendum)
Change omeprazole Take 30- 60 min before your first and last meals of the day   GERD (REFLUX)  is an extremely common cause of respiratory symptoms, many times with no significant heartburn at all.    It can be treated with medication, but also with lifestyle changes including avoidance of late meals, excessive alcohol, smoking cessation, and avoid fatty foods, chocolate, peppermint, colas, red wine, and acidic juices such as orange juice.  NO MINT OR MENTHOL PRODUCTS SO NO COUGH DROPS  USE SUGARLESS CANDY INSTEAD (jolley ranchers or Stover's)  NO OIL BASED VITAMINS - use powdered substitutes.   Please remember to go to the lab and x-ray department downstairs for your tests - we will call you with the results when they are available.      Please schedule a follow up office visit in 4 weeks, sooner if needed to see Dr Marchelle Gearing

## 2012-01-17 NOTE — Telephone Encounter (Signed)
ATC-rang 3 times and hung up. Will await for call back. Also, need DPR signed so we may discuss patients care with daughter in law.

## 2012-01-17 NOTE — Progress Notes (Signed)
Quick Note:  Spoke with pt and notified of results per Dr. Wert. Pt verbalized understanding and denied any questions.  ______ 

## 2012-01-18 NOTE — Telephone Encounter (Signed)
Per Natalia Leatherwood, she spoke with someone yesterday regarding cxr results and nothing further is needed.

## 2012-02-16 ENCOUNTER — Ambulatory Visit (INDEPENDENT_AMBULATORY_CARE_PROVIDER_SITE_OTHER): Payer: Medicare Other | Admitting: Internal Medicine

## 2012-02-16 ENCOUNTER — Encounter: Payer: Self-pay | Admitting: Internal Medicine

## 2012-02-16 VITALS — BP 130/62 | HR 61 | Temp 98.2°F | Ht 61.0 in | Wt 129.2 lb

## 2012-02-16 DIAGNOSIS — J9610 Chronic respiratory failure, unspecified whether with hypoxia or hypercapnia: Secondary | ICD-10-CM

## 2012-02-16 MED ORDER — BENZONATATE 200 MG PO CAPS: 200.0000 mg | ORAL_CAPSULE | Freq: Three times a day (TID) | ORAL | Status: DC | PRN

## 2012-02-16 NOTE — Progress Notes (Signed)
Subjective:    Patient ID: Mackenzie Peterson, female    DOB: [birth year], 79 y.o.   MRN: [id number]  HPI IOV 10/26/2009/ Mackenzie Peterson:    [place] immigrant with dementia. Never smoker but pasive smoker trhough age mid-30s.. Brought in by daughter in law Mackenzie Peterson who is interpreter. [name] states moved to [place] from [place], [place] in Jan 2011 to be with son. Had pulmonologist at [facility]. [name] also saw a DR Mackenzie Peterson at [facility], [place]. 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 Patient states moderate cough > 20 years. STable. Worse in morning and worse at night. Sometimes dry and sometimes mucus. Associated chronic daily GERD + and post nasal drip +. Cough made worse by "any effort" such as picking up stuff, walking. Cough relieved by relaxingand resting.  Cough is associated with dyspnea. She states she gets dyspneic walking. Dyspnea relieved by rest. Dyspnea is present for activities like changing clothes.  Only outside records available is from Dr Mackenzie Peterson. THere are some spirometries that shows restriction and one that shows obstrictuion     Admit date: 01/05/2012  Discharge date: 01/08/2012  Time spent: minutes  Recommendations for Outpatient Follow-up:  1. Needs follow up chest x ray to assure resolution of pneumonia  Discharge Diagnoses:  Pneumonia  Acute on chronic respiratory failure - stas 86% room air - patient placed on 2 liters of oxygen 24 hrs/day  HYPOTHYROIDISM  DEPRESSION  ASTHMA  Leukocytosis - resolved  Abdominal pain - resolved  Dementia    01/16/2012  [name]  In for post hosp f/u ov cc appetite better, now 24/7 2lpm where previously on just bedtime,  Cough is back to baseline, dry daily not really interfering with sleep.  Daughter convinced neb helping up to one or twice daily - using prilosec before supper.  Sleeping ok without nocturnal  or early am exacerbation  of respiratory   c/o's or need for noct saba. Also denies any obvious fluctuation of symptoms with weather or environmental changes or other aggravating or alleviating factors except as outlined above   ROS  The following are not active complaints unless bolded sore throat, dysphagia, dental problems, itching, sneezing,  nasal congestion or excess/ purulent secretions, ear ache,   fever, chills, sweats, unintended wt loss, pleuritic or exertional cp, hemoptysis,  orthopnea pnd or leg swelling, presyncope, palpitations, heartburn, abdominal pain, anorexia, nausea, vomiting, diarrhea  or change in bowel or urinary habits, change in stools or urine, dysuria,hematuria,  rash, arthralgias, visual complaints, headache, numbness weakness or ataxia or problems with walking or coordination> w/c bound  Plus  Memory comes and goes      REC  Change omeprazole Take 30- 60 min before your first and last meals of the day  GERD (REFLUX) is an extremely common cause of respiratory symptoms, many times with no significant heartburn at all.  It can be treated with medication, but also with lifestyle changes including avoidance of late meals, excessive alcohol, smoking cessation, and avoid fatty foods, chocolate, peppermint, colas, red wine, and acidic juices such as orange juice.  NO MINT OR MENTHOL PRODUCTS SO NO COUGH DROPS  USE SUGARLESS CANDY INSTEAD (jolley ranchers or Stover's)  NO OIL BASED VITAMINS - use powdered substitutes.  Please remember to go to the lab and x-ray department downstairs for your tests - we will call you with the results when they are available.  Please schedule a  follow up office visit in 4 weeks, sooner if needed to see Dr Mackenzie Peterson    OV 02/16/2012   Followup cough with ILD NOS.   - Personallyu not seen since sept 2011. Presents with daughter in law who is highly involved and knows all details. Hx is from daughter in law because patient has moderate dementia. Per hx: Resp issue overall stable. AFter  recent admit: cough has improved esp with starting oxygen. Now on o2 x 24/7. Daughter in law expressing symptom based care with careful weightage of tests and medications. I still have not gotten old records. Daugher in Social worker says she signed release. I called Dr Mackenzie Peterson office near [place], [place] and they have not received any release. PAtient was last seen there in 2009 per them. 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 Was told Occupational lung disease NOS. No hx of chronic prednisone but positive for only intermittent prednisone    Past, Family, Social reviewed: dementia has progresses. No short term memory. Can talk sentences. Can getup and  Walk few to several feet but has incontinence. No mood agression. Mostly passive. Speaking has reduced but still speaks full sentences. REmote memory good   Review of Systems  Constitutional: Negative for fever and unexpected weight change.  HENT: Negative for ear pain, nosebleeds, congestion, sore throat, rhinorrhea, sneezing, trouble swallowing, dental problem, postnasal drip and sinus pressure.   Eyes: Negative for redness and itching.  Respiratory: Positive for cough and shortness of breath. Negative for chest tightness and wheezing.   Cardiovascular: Negative for palpitations and leg swelling.  Gastrointestinal: Negative for nausea and vomiting.  Genitourinary: Negative for dysuria.  Musculoskeletal: Negative for joint swelling.  Skin: Negative for rash.  Neurological: Negative for headaches.  Hematological: Does not bruise/bleed easily.  Psychiatric/Behavioral: Negative for dysphoric mood. The patient is not nervous/anxious.        Objective:   Physical Exam w/c bound frail elderly hispanic female lets her Grand-daughter-in-law do her speaking  HEENT: nl dentition,  turbinates, and orophanx. Nl external ear canals without cough reflex   NECK :  without JVD/Nodes/TM/ nl carotid upstrokes bilaterally   LUNGS: no acc muscle use, clear to A and P bilaterally without cough on insp or exp maneuvers   CV:  RRR  no s3 or murmur or increase in P2, no edema   ABD:  soft and nontender with nl excursion in the supine position. No bruits or organomegaly, bowel sounds nl  MS:  warm without deformities, calf tenderness, cyanosis or clubbing  SKIN: warm and dry without lesions    NEURO:  Alert intermittently knows day of week per fm          Assessment & Plan:

## 2012-02-16 NOTE — Patient Instructions (Addendum)
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 If this does not work, we can try hycodan  - We will call Pulmonary Cesar Darnelle Maffucci MD, [address], [place], [place] ? at [phone] ?cell . [phone] and get records faxed. I wll try to talk to him  - We will call [name], MD about wheel chair -you should qualify on basis of dementia   Return to see me in 3 months Call [phone] anytime there are problems

## 2012-02-18 ENCOUNTER — Telehealth: Payer: Self-pay | Admitting: Internal Medicine

## 2012-02-18 NOTE — Telephone Encounter (Signed)
Jen  Please calll [name], MD and see why they cannot do wheel chair. Apparently they told patient family they are not taking careo of the right code for the patient. I am assuming they aer saying Chronic resp failure is on [place]. However, patient has dementia and why cannot they get wheel chair on that basisis  Also, please ensue Dr Janine Ores office in [facility] (see my note instructions for their phone and fax) sends [place] her records  Thanks MR

## 2012-02-18 NOTE — Assessment & Plan Note (Addendum)
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 If this does not work, we can try hycodan  - We will call Pulmonary Cesar Darnelle Maffucci MD, [address] [place],  ? at [phone] ?cell . [phone] and get records faxed. I wll try to talk to him  - We will call [name], MD about wheel chair -you should qualify on basis of dementia   Return to see me in 3 months Call [phone] anytime there are problems   > 50% of this > 25 min visit spent in face to face counseling (15 min visit converted to 25 min)

## 2012-02-19 NOTE — Telephone Encounter (Signed)
Christina w/ Dr. Redmond School is returning Jennifer's call.  States that they are handling the wheelchair order for the pt, just waiting on their NP to write the script.  Trula Ore can be reached at [phone].  Antionette Fairy

## 2012-02-19 NOTE — Telephone Encounter (Signed)
lmtcb with Trula Ore, nurse for Dr. Redmond School, to call back so I can discuss wheelchair with her. I have faxed the request to Dr. Janine Ores in [place]. I have not received and records at this time. I will keep a look-out for records. Carron Curie, CMA

## 2012-04-01 ENCOUNTER — Encounter: Payer: Self-pay | Admitting: *Deleted

## 2012-04-01 ENCOUNTER — Telehealth: Payer: Self-pay | Admitting: Internal Medicine

## 2012-04-01 NOTE — Telephone Encounter (Signed)
**Note De-Identified  Obfuscation** lmomtcb for christina.   Christina w/ Dr. Redmond School is returning Jennifer's call. States that they are handling the wheelchair order for the pt, just waiting on their NP to write the script. Christina from telephone note on 02/18/2012.

## 2012-04-02 NOTE — Telephone Encounter (Signed)
Called, spoke with Santina Evans, pt's daughter-in-law. She is calling to check on the status of wheelchair for pt.  Per phone msg from 02/18/12:   Antionette Fairy at 02/19/2012 12:45 PM    Status: Signed             Christina w/ Dr. Redmond School is returning Jennifer's call. States that they are handling the wheelchair order for the pt, just waiting on their NP to write the script. Trula Ore can be reached at [phone]. Antionette Fairy   -----------  I informed Santina Evans of this.  Santina Evans states they haven't heard anything from Dr. Pilar Grammes office, and [facility] still hasn't gotten the order for wheelchair for pt.  She would like this taken care of and states that Dr. Pilar Grammes office will not return their calls.  I have called Dr. Pilar Grammes office and lmomtcb on Christina's named VM to see if they have taken care of this (see phone msg from 02/18/12 for additional information).  Santina Evans would like [place] to leave a detailed msg on the # she provided if she doesn't answer.

## 2012-04-02 NOTE — Telephone Encounter (Signed)
lmomtcb on Christina's named VM

## 2012-04-02 NOTE — Telephone Encounter (Signed)
Mackenzie Peterson     [phone] calling back for the nurse

## 2012-04-02 NOTE — Telephone Encounter (Signed)
lmomtcb for [employer]

## 2012-04-02 NOTE — Telephone Encounter (Signed)
Trula Ore returning call can be reached at [phone].Raylene Everts

## 2012-04-03 NOTE — Telephone Encounter (Signed)
I spoke with Christina at Dr. Carolyn Stare office and she could not tell me why but the script was never sent for wheelchair. I advised that this needs to be done asap. Trula Ore states she will take care of this today.  I have advised the pt daughter-in law of this as well. Carron Curie, CMA

## 2012-06-11 ENCOUNTER — Encounter: Payer: Self-pay | Admitting: Internal Medicine

## 2012-06-11 ENCOUNTER — Ambulatory Visit (INDEPENDENT_AMBULATORY_CARE_PROVIDER_SITE_OTHER): Payer: Medicare Other | Admitting: Internal Medicine

## 2012-06-11 VITALS — BP 132/60 | HR 69 | Temp 98.1°F | Ht 61.0 in | Wt 130.0 lb

## 2012-06-11 DIAGNOSIS — J9610 Chronic respiratory failure, unspecified whether with hypoxia or hypercapnia: Secondary | ICD-10-CM

## 2012-06-11 MED ORDER — FLUTICASONE PROPIONATE 50 MCG/ACT NA SUSP: 2.0000 | Freq: Every day | NASAL | Status: DC

## 2012-06-11 NOTE — Assessment & Plan Note (Signed)
#  Nasal allergies  - try  take generic fluticasone inhaler 2 squirts each nostril daily   #Interstitial Lung disease  - continue to watch clinically; currently appears stable - we will hold off CT chest as discussed - will try again to get information from Dr Janine Ores office  #Followup  - 6 months or sooner if needed   (> 50% of this 15 min visit spent in face to face counseling)

## 2012-06-11 NOTE — Progress Notes (Signed)
Subjective:    Patient ID: Mackenzie Peterson, female    DOB: 25-Dec-1924, 79 y.o.   MRN: [id number]  HPI IOV 10/26/2009/ Jaston Havens:     [place] immigrant with dementia. Never smoker but pasive smoker trhough age mid-30s.. Brought in by daughter in law [name] who is interpreter. [name] states moved to [place] from [place], [place] in Jan 2011 to be with son. Had pulmonologist at [facility]. [name] also saw a DR Henrene Dodge at [facility] [place]. 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 Patient states moderate cough > 20 years. STable. Worse in morning and worse at night. Sometimes dry and sometimes mucus. Associated chronic daily GERD + and post nasal drip +. Cough made worse by "any effort" such as picking up stuff, walking. Cough relieved by relaxingand resting.  Cough is associated with dyspnea. She states she gets dyspneic walking. Dyspnea relieved by rest. Dyspnea is present for activities like changing clothes.  Only outside records available is from Dr Julio Sicks. THere are some spirometries that shows restriction and one that shows obstrictuion     Admit date: 01/05/2012  Discharge date: 01/08/2012  Time spent: minutes  Recommendations for Outpatient Follow-up:  1. Needs follow up chest x ray to assure resolution of pneumonia  Discharge Diagnoses:  Pneumonia  Acute on chronic respiratory failure - stas 86% room air - patient placed on 2 liters of oxygen 24 hrs/day  HYPOTHYROIDISM  DEPRESSION  ASTHMA  Leukocytosis - resolved  Abdominal pain - resolved  Dementia    01/16/2012  Wert 17 yohispanic female  In for post hosp f/u ov cc appetite better, now 24/7 2lpm where previously on just bedtime,  Cough is back to baseline, dry daily not really interfering with sleep.  Daughter convinced neb helping up to one or twice daily - using prilosec before supper.  Sleeping ok without nocturnal  or early am exacerbation  of respiratory   c/o's or need for noct saba. Also denies any obvious fluctuation of symptoms with weather or environmental changes or other aggravating or alleviating factors except as outlined above   REC  Change omeprazole Take 30- 60 min before your first and last meals of the day  GERD (REFLUX) is an extremely common cause of respiratory symptoms, many times with no significant heartburn at all.  It can be treated with medication, but also with lifestyle changes including avoidance of late meals, excessive alcohol, smoking cessation, and avoid fatty foods, chocolate, peppermint, colas, red wine, and acidic juices such as orange juice.  NO MINT OR MENTHOL PRODUCTS SO NO COUGH DROPS  USE SUGARLESS CANDY INSTEAD (jolley ranchers or Stover's)  NO OIL BASED VITAMINS - use powdered substitutes.  Please remember to go to the lab and x-ray department downstairs for your tests - we will call you with the results when they are available.  Please schedule a follow up office visit in 4 weeks, sooner if needed to see Dr Marchelle Gearing    OV 02/16/2012   Followup cough with ILD NOS.   - Personallyu not seen since sept 2011. Presents with daughter in law who is highly involved and knows all details. Hx is from daughter in law because patient has moderate dementia. Per hx: Resp issue overall stable. AFter recent admit: cough has improved esp with starting oxygen. Now on o2 x 24/7. Daughter in law expressing symptom based care with careful weightage of tests and medications. I still have  not gotten old records. Daugher in Social worker says she signed release. I called Dr Janine Ores office near [place], [place] and they have not received any release. PAtient was last seen there in 2009 per them. 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 Was told Occupational lung  disease NOS. No hx of chronic prednisone but positive for only intermittent prednisone  CT chest Jun 21, 2009  IMPRESSION:  1. Innumerable small pulmonary nodules throughout all lobes of both  lungs. Primary considerations include infectious chronic  granulomatous disease (fungal or tuberculosis), or metastatic  disease. Findings discussed with Dr.Osei-Bonsu by telephone 10:40  am 06/21/2009.  2. Calcified right hilar lymph node is compatible with a history  of prior granulomatous disease.  3. Negative for lymphadenopathy, pleural effusion, or suspicious  bony lesion.  4. Probable right hepatic lobe cyst.  Provider: Joline Maxcy        Past, Family, Social reviewed: dementia has progresses. No short term memory. Can talk sentences. Can getup and  Walk few to several feet but has incontinence. No mood agression. Mostly passive. Speaking has reduced but still speaks full sentences. 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 If this does not work, we can try hycodan  - We will call Pulmonary Cesar Darnelle Maffucci MD, [address] [place], [place] ? at [phone] ?cell . [phone] and get records faxed. I wll try to talk to him  - We will call [name], MD about wheel chair -you should qualify on basis of dementia  Return to see me in 3 months  Call [phone] anytime there are problems     OV 06/11/2012 Followup interstitial lung disease not otherwise specified with palliative approach  - Presents with her daughter-in-law. Respiratory status has been stable. She uses oxygen at night. There is no major  deterioration. In terms of her dementia she continues to be stable. Only issue is some spring allergies and nasal drainage. We discussed repeat CT scan of the chest  For her interstitial lung disease monitoring because she cannot do pulmonary function test due to dementia. The daughter he wants to hold off at this point. We are still awaiting old records from [place]. We called the office of Dr. Janine Ores and again they say that they never got the release.   Past, Family, Social reviewed: no change since last visit  Review of Systems  Constitutional: Negative for fever and unexpected weight change.  HENT: Negative for ear pain, nosebleeds, congestion, sore throat, rhinorrhea, sneezing, trouble swallowing, dental problem, postnasal drip and sinus pressure.   Eyes: Negative for redness and itching.  Respiratory: Negative for cough, chest tightness, shortness of breath and wheezing.   Cardiovascular: Negative for palpitations and leg swelling.  Gastrointestinal: Negative for nausea and vomiting.  Genitourinary: Negative for dysuria.  Musculoskeletal: Negative for joint swelling.  Skin: Negative for rash.  Neurological: Negative for headaches.  Hematological:  Does not bruise/bleed easily.  Psychiatric/Behavioral: Negative for dysphoric mood. The patient is not nervous/anxious.        Objective:   Physical Exam w/c bound frail elderly hispanic female lets her Grand-daughter-in-law do her speaking  HEENT: nl dentition, turbinates, and orophanx. Nl external ear canals without cough reflex   NECK :  without JVD/Nodes/TM/ nl carotid upstrokes bilaterally   LUNGS: no acc muscle use, clear to A and P bilaterally without cough on insp or exp maneuvers   CV:  RRR  no s3 or murmur or increase in P2, no edema   ABD:  soft and nontender with nl excursion in the supine position. No bruits or organomegaly, bowel sounds nl  MS:  warm without deformities, calf tenderness, cyanosis or clubbing  SKIN:  warm and dry without lesions    NEURO:  Alert intermittently knows day of week per fm           Assessment & Plan:

## 2012-06-11 NOTE — Patient Instructions (Addendum)
#  Nasal allergies  - try  take generic fluticasone inhaler 2 squirts each nostril daily   #Interstitial Lung disease  - continue to watch clinically; currently appears stable - we will hold off CT chest as discussed - will try again to get information from Dr Janine Ores office  #Followup  - 6 months or sooner if needed

## 2012-06-17 ENCOUNTER — Emergency Department (HOSPITAL_BASED_OUTPATIENT_CLINIC_OR_DEPARTMENT_OTHER)
Admission: EM | Admit: 2012-06-17 | Discharge: 2012-06-17 | Disposition: A | Discharge status: Home / Self Care | Payer: Medicare Other | Attending: Emergency Medicine | Admitting: Emergency Medicine

## 2012-06-17 ENCOUNTER — Encounter (HOSPITAL_BASED_OUTPATIENT_CLINIC_OR_DEPARTMENT_OTHER): Payer: Self-pay

## 2012-06-17 ENCOUNTER — Emergency Department (HOSPITAL_BASED_OUTPATIENT_CLINIC_OR_DEPARTMENT_OTHER): Payer: Medicare Other

## 2012-06-17 DIAGNOSIS — Z79899 Other long term (current) drug therapy: Secondary | ICD-10-CM | POA: Insufficient documentation

## 2012-06-17 DIAGNOSIS — Z8679 Personal history of other diseases of the circulatory system: Secondary | ICD-10-CM | POA: Insufficient documentation

## 2012-06-17 DIAGNOSIS — Z8709 Personal history of other diseases of the respiratory system: Secondary | ICD-10-CM | POA: Insufficient documentation

## 2012-06-17 DIAGNOSIS — R5381 Other malaise: Secondary | ICD-10-CM | POA: Insufficient documentation

## 2012-06-17 DIAGNOSIS — I10 Essential (primary) hypertension: Secondary | ICD-10-CM | POA: Insufficient documentation

## 2012-06-17 DIAGNOSIS — IMO0002 Reserved for concepts with insufficient information to code with codable children: Secondary | ICD-10-CM | POA: Insufficient documentation

## 2012-06-17 DIAGNOSIS — E079 Disorder of thyroid, unspecified: Secondary | ICD-10-CM | POA: Insufficient documentation

## 2012-06-17 DIAGNOSIS — G309 Alzheimer's disease, unspecified: Secondary | ICD-10-CM | POA: Insufficient documentation

## 2012-06-17 DIAGNOSIS — J441 Chronic obstructive pulmonary disease with (acute) exacerbation: Secondary | ICD-10-CM | POA: Insufficient documentation

## 2012-06-17 DIAGNOSIS — F0280 Dementia in other diseases classified elsewhere without behavioral disturbance: Secondary | ICD-10-CM | POA: Insufficient documentation

## 2012-06-17 DIAGNOSIS — F341 Dysthymic disorder: Secondary | ICD-10-CM | POA: Insufficient documentation

## 2012-06-17 DIAGNOSIS — F0390 Unspecified dementia without behavioral disturbance: Secondary | ICD-10-CM | POA: Insufficient documentation

## 2012-06-17 DIAGNOSIS — E785 Hyperlipidemia, unspecified: Secondary | ICD-10-CM | POA: Insufficient documentation

## 2012-06-17 DIAGNOSIS — Z7982 Long term (current) use of aspirin: Secondary | ICD-10-CM | POA: Insufficient documentation

## 2012-06-17 LAB — BASIC METABOLIC PANEL
CO2: 25 mEq/L (ref 19–32)
Calcium: 8.8 mg/dL (ref 8.4–10.5)
Creatinine, Ser: 1.1 mg/dL (ref 0.50–1.10)
GFR calc Af Amer: 52 mL/min — ABNORMAL LOW (ref >90)
GFR calc non Af Amer: 44 mL/min — ABNORMAL LOW (ref >90)
Sodium: 136 mEq/L (ref 135–145)

## 2012-06-17 LAB — CBC WITH DIFFERENTIAL/PLATELET
Basophils Absolute: 0 10*3/uL (ref 0.0–0.1)
Basophils Relative: 0 % (ref 0–1)
Eosinophils Relative: 2 % (ref 0–5)
HCT: 40.5 % (ref 36.0–46.0)
Lymphocytes Relative: 22 % (ref 12–46)
MCHC: 33.8 g/dL (ref 30.0–36.0)
MCV: 92.3 fL (ref 78.0–100.0)
Monocytes Absolute: 1.3 10*3/uL — ABNORMAL HIGH (ref 0.1–1.0)
Platelets: 210 10*3/uL (ref 150–400)
RDW: 13.2 % (ref 11.5–15.5)
WBC: 6.9 10*3/uL (ref 4.0–10.5)

## 2012-06-17 LAB — URINE MICROSCOPIC-ADD ON

## 2012-06-17 LAB — URINALYSIS, ROUTINE W REFLEX MICROSCOPIC
Ketones, ur: NEGATIVE mg/dL
Leukocytes, UA: NEGATIVE
Nitrite: NEGATIVE
Protein, ur: NEGATIVE mg/dL
Urobilinogen, UA: 0.2 mg/dL (ref 0.0–1.0)

## 2012-06-17 MED ORDER — ALBUTEROL SULFATE (5 MG/ML) 0.5% IN NEBU
5.0000 mg | INHALATION_SOLUTION | Freq: Once | RESPIRATORY_TRACT | Status: AC
Start: 2012-06-17 — End: 2012-06-17
  Administered 2012-06-17: 5 mg via RESPIRATORY_TRACT
  Filled 2012-06-17: qty 1

## 2012-06-17 MED ORDER — IPRATROPIUM BROMIDE 0.02 % IN SOLN
0.5000 mg | Freq: Once | RESPIRATORY_TRACT | Status: AC
  Administered 2012-06-17: 0.5 mg via RESPIRATORY_TRACT
  Filled 2012-06-17: qty 2.5

## 2012-06-17 MED ORDER — PREDNISONE 20 MG PO TABS: ORAL_TABLET | ORAL | Status: DC

## 2012-06-17 NOTE — ED Provider Notes (Signed)
History  This chart was scribed for Mackenzie Cooper III, MD by Ardeen Jourdain, ED Scribe. This patient was seen in room MH09/MH09 and the patient's care was started at 1529.  CSN: [id number]  Arrival date & time 06/17/12  1506   First MD Initiated Contact with Patient 06/17/12 1529      Chief Complaint  Patient presents with  . Cough     The history is provided by a relative (Daughter). No language interpreter was used.    HPI Comments: Mackenzie Peterson is a 79 y.o. female with a h/o asthma, PVD, alzheimer disease and emphysema who presents to the Emergency Department complaining of gradual onset, gradually worsening, constant non-productive cough with associated fatigue that began 3 days ago. Pt demented- daughter gave all history and ROS. Pts daughter states she gave the pt a nebulizer treatment at home with no relief. Pts daughter states she is refusing to get out of bed and refusing to eat. Pts daughter states she has not been able to sleep at night due to the cough. Pts daughter states the pt was admitted to the hospital for pneumonia 12/13. Pts daughter denies fever as an associated symptom.    Past Medical History  Diagnosis Date  . Asthma   . Hypertension   . Hyperlipidemia   . Chronic bronchitis   . hypothyroid   . PVD (peripheral vascular disease)   . Emphysema   . Dysthymic disorder   . Alzheimer disease     History reviewed. No pertinent past surgical history.  History reviewed. No pertinent family history.  History  Substance Use Topics  . Smoking status: Never Smoker   . Smokeless tobacco: Never Used  . Alcohol Use: No    No Ob history available.   Review of Systems  Unable to perform ROS: Dementia  Constitutional: Positive for fatigue. Negative for fever.  Respiratory: Positive for cough.     Allergies  Review of patient's allergies indicates no known allergies.  Home Medications   Current Outpatient Rx  Name  Route  Sig  Dispense  Refill  .  albuterol (PROVENTIL) (2.5 MG/3ML) 0.083% nebulizer solution   Nebulization   Take 2.5 mg by nebulization every 6 (six) hours as needed. For wheezing or shortness of breath         . ALPRAZolam (XANAX) 0.5 MG tablet   Oral   Take 0.5 mg by mouth 2 (two) times daily.         Marland Kitchen aspirin 81 MG tablet   Oral   Take 81 mg by mouth daily.         . cetirizine (ZYRTEC) 10 MG tablet   Oral   Take 10 mg by mouth at bedtime.         . donepezil (ARICEPT) 10 MG tablet   Oral   Take 10 mg by mouth at bedtime.         Marland Kitchen escitalopram (LEXAPRO) 20 MG tablet   Oral   Take 20 mg by mouth at bedtime.         . fluticasone (FLONASE) 50 MCG/ACT nasal spray   Nasal   Place 2 sprays into the nose daily.   16 g   2   . levothyroxine (SYNTHROID, LEVOTHROID) 25 MCG tablet   Oral   Take 25 mcg by mouth daily.         . memantine (NAMENDA) 10 MG tablet   Oral   Take 10 mg by mouth 2 (two) times  daily.         . pravastatin (PRAVACHOL) 20 MG tablet   Oral   Take 20 mg by mouth at bedtime.         Marland Kitchen QUEtiapine (SEROQUEL) 50 MG tablet   Oral   Take 50 mg by mouth 2 (two) times daily.         . solifenacin (VESICARE) 5 MG tablet   Oral   Take 10 mg by mouth at bedtime.         . traZODone (DESYREL) 50 MG tablet   Oral   Take 50 mg by mouth at bedtime.         . benzonatate (TESSALON) 200 MG capsule   Oral   Take 1 capsule (200 mg total) by mouth 3 (three) times daily as needed for cough.   90 capsule   1   . nitroGLYCERIN (NITROSTAT) 0.4 MG SL tablet   Sublingual   Place 0.4 mg under the tongue every 5 (five) minutes as needed. For chest pain         . omeprazole (PRILOSEC) 20 MG capsule      Take 30- 60 min before your first and last meals of the day   60 capsule   11     Triage Vitals: BP 138/57  Pulse 87  Temp(Src) 98.5 F (36.9 C) (Oral)  Resp 16  SpO2 95%  Physical Exam  Nursing note and vitals reviewed. Constitutional: She is oriented  to person, place, and time. She appears well-developed and well-nourished. No distress.  HENT:  Head: Normocephalic and atraumatic.  Right Ear: External ear normal.  Left Ear: External ear normal.  Mouth/Throat: Oropharynx is clear and moist. No oropharyngeal exudate.  TMs normal bilaterally  Eyes: Conjunctivae and EOM are normal. Pupils are equal, round, and reactive to light. Right eye exhibits no discharge. Left eye exhibits no discharge.  Neck: Normal range of motion. Neck supple. Carotid bruit is not present. No tracheal deviation present.  Cardiovascular: Normal rate, regular rhythm and normal heart sounds.  Exam reveals no gallop and no friction rub.   No murmur heard. Pulmonary/Chest: Effort normal. No respiratory distress. She has no wheezes. She has rhonchi. She has no rales. She exhibits no tenderness.  Fine rhonchi over all fields bilaterally   Abdominal: Soft. Bowel sounds are normal. She exhibits no distension. There is no tenderness.  Musculoskeletal: Normal range of motion. She exhibits no edema.  Neurological: She is alert and oriented to person, place, and time.  Skin: Skin is warm and dry. She is not diaphoretic.  Psychiatric: She has a normal mood and affect. Her behavior is normal.    ED Course  Procedures (including critical care time)  DIAGNOSTIC STUDIES: Oxygen Saturation is 95% on room air, adequate by my interpretation.    COORDINATION OF CARE:  3:43 PM-Discussed treatment plan which includes albuterol breathing treatment, CXR, CBC, BMP and UA with pt at bedside and pt agreed to plan.   Results for orders placed during the hospital encounter of 06/17/12  CBC WITH DIFFERENTIAL      Result Value Range   WBC 6.9  4.0 - 10.5 K/uL   RBC 4.39  3.87 - 5.11 MIL/uL   Hemoglobin 13.7  12.0 - 15.0 g/dL   HCT 91.4  78.2 - 95.6 %   MCV 92.3  78.0 - 100.0 fL   MCH 31.2  26.0 - 34.0 pg   MCHC 33.8  30.0 - 36.0 g/dL  RDW 13.2  11.5 - 15.5 %   Platelets 210  150 -  400 K/uL   Neutrophils Relative % 57  43 - 77 %   Neutro Abs 3.9  1.7 - 7.7 K/uL   Lymphocytes Relative 22  12 - 46 %   Lymphs Abs 1.5  0.7 - 4.0 K/uL   Monocytes Relative 19 (*) 3 - 12 %   Monocytes Absolute 1.3 (*) 0.1 - 1.0 K/uL   Eosinophils Relative 2  0 - 5 %   Eosinophils Absolute 0.1  0.0 - 0.7 K/uL   Basophils Relative 0  0 - 1 %   Basophils Absolute 0.0  0.0 - 0.1 K/uL  BASIC METABOLIC PANEL      Result Value Range   Sodium 136  135 - 145 mEq/L   Potassium 4.3  3.5 - 5.1 mEq/L   Chloride 99  96 - 112 mEq/L   CO2 25  19 - 32 mEq/L   Glucose, Bld 166 (*) 70 - 99 mg/dL   BUN 27 (*) 6 - 23 mg/dL   Creatinine, Ser 2.13  0.50 - 1.10 mg/dL   Calcium 8.8  8.4 - 08.6 mg/dL   GFR calc non Af Amer 44 (*) >90 mL/min   GFR calc Af Amer 52 (*) >90 mL/min   Dg Chest 2 View  06/17/2012   *RADIOLOGY REPORT*  Clinical Data: Cough for 3 days  CHEST - 2 VIEW  Comparison: 01/16/2012  Findings: Nodular interstitial changes noted throughout both lungs and stable from prior exam.  No focal infiltrate or sizable effusion is seen.  No bony abnormality is noted.  IMPRESSION: Chronic changes without acute abnormality.   Original Report Authenticated By: Alcide Clever, M.D.    Lab workup is essentially negative.  She should continue to use albuterol nebulizer, can take Prednisone 20 mg bid x 5 days.  F/U with Dr. Marchelle Gearing, her pulmonologist.      1. COPD exacerbation      I personally performed the services described in this documentation, which was scribed in my presence. The recorded information has been reviewed and is accurate.  Osvaldo Human, MD      Mackenzie Cooper III, MD 06/17/12 [phone]

## 2012-06-17 NOTE — ED Notes (Signed)
Daughter states that patient has deep cough, x3 days, incr fatigue, no fever, nebulizer given at home, cough not improving.  Hx of pna last December with admission to hospital.

## 2012-10-03 ENCOUNTER — Other Ambulatory Visit: Payer: Self-pay | Admitting: *Deleted

## 2012-10-03 MED ORDER — FLUTICASONE PROPIONATE 50 MCG/ACT NA SUSP: 2.0000 | Freq: Every day | NASAL | Status: DC

## 2012-12-19 ENCOUNTER — Ambulatory Visit (INDEPENDENT_AMBULATORY_CARE_PROVIDER_SITE_OTHER): Payer: Medicare Other | Admitting: Internal Medicine

## 2012-12-19 ENCOUNTER — Encounter: Payer: Self-pay | Admitting: Internal Medicine

## 2012-12-19 VITALS — BP 126/60 | HR 66 | Temp 97.5°F | Ht 61.0 in | Wt 136.8 lb

## 2012-12-19 DIAGNOSIS — J9610 Chronic respiratory failure, unspecified whether with hypoxia or hypercapnia: Secondary | ICD-10-CM

## 2012-12-19 NOTE — Patient Instructions (Signed)
#  Nasal allergies  - try  take generic fluticasone inhaler 2 squirts each nostril as needed   #Interstitial Lung disease  - continue to watch clinically; currently appears stable - we will hold off CT chest as discussed - glad you had flu shot  #Followup  - 9 months or sooner if needed

## 2012-12-19 NOTE — Progress Notes (Signed)
Subjective:    Mackenzie Peterson ID: Mackenzie Peterson, Peterson    DOB: May 26, 1924, 79 y.o.   MRN: [id number] PCP [name], MD  HPI OV 10/26/2009/ Ramaswamy:     Mackenzie Peterson with dementia. Never smoker but pasive smoker trhough age mid-30s.. Brought in by daughter in law [name] who is interpreter. [name] states moved to [place] from [place], [place] in Jan 2011 to be with son. Had pulmonologist at [facility]. [name] also saw a DR Henrene Dodge at [facility], [place]. 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 Mackenzie Peterson states moderate cough > 20 years. STable. Worse in morning and worse at night. Sometimes dry and sometimes mucus. Associated chronic daily GERD + and post nasal drip +. Cough made worse by "any effort" such as picking up stuff, walking. Cough relieved by relaxingand resting.  Cough is associated with dyspnea. Mackenzie Peterson states Mackenzie Peterson gets dyspneic walking. Dyspnea relieved by rest. Dyspnea is present for activities like changing clothes.  Only outside records available is from Dr Julio Sicks. THere are some spirometries that shows restriction and one that shows obstrictuion     Admit date: 01/05/2012  Discharge date: 01/08/2012  Time spent: minutes  Recommendations for Outpatient Follow-up:  1. Needs follow up chest x ray to assure resolution of pneumonia  Discharge Diagnoses:  Pneumonia  Acute on chronic respiratory failure - stas 86% room air - Mackenzie Peterson placed on 2 liters of oxygen 24 hrs/day  HYPOTHYROIDISM  DEPRESSION  ASTHMA  Leukocytosis - resolved  Abdominal pain - resolved  Dementia    01/16/2012  Mackenzie Peterson  In for post hosp f/u ov cc appetite better, now 24/7 2lpm where previously on just bedtime,  Cough is back to baseline, dry daily not really interfering with sleep.  Daughter convinced neb helping up to one or twice daily - using prilosec before supper.  Sleeping ok without nocturnal  or early am  exacerbation  of respiratory  c/o's or need for noct saba. Also denies any obvious fluctuation of symptoms with weather or environmental changes or other aggravating or alleviating factors except as outlined above   REC  Change omeprazole Take 30- 60 min before your first and last meals of the day  GERD (REFLUX) is an extremely common cause of respiratory symptoms, many times with no significant heartburn at all.  It can be treated with medication, but also with lifestyle changes including avoidance of late meals, excessive alcohol, smoking cessation, and avoid fatty foods, chocolate, peppermint, colas, red wine, and acidic juices such as orange juice.  NO MINT OR MENTHOL PRODUCTS SO NO COUGH DROPS  USE SUGARLESS CANDY INSTEAD (jolley ranchers or Stover's)  NO OIL BASED VITAMINS - use powdered substitutes.  Please remember to go to the lab and x-ray department downstairs for your tests - we will call you with the results when they are available.  Please schedule a follow up office visit in 4 weeks, sooner if needed to see Dr Marchelle Gearing    OV 02/16/2012   Followup cough with ILD NOS.   - Personallyu not seen since sept 2011. Presents with daughter in law who is highly involved and knows all details. Hx is from daughter in law because Mackenzie Peterson has moderate dementia. Per hx: Resp issue overall stable. AFter recent admit: cough has improved esp with starting oxygen. Now on o2 x 24/7. Daughter in law expressing symptom based care with careful weightage of tests and  medications. I still have not gotten old records. Daugher in Social worker says Mackenzie Peterson signed release. I called Dr Janine Ores office near [place], [place] and they have not received any release. Mackenzie Peterson was last seen there in 2009 per them. 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 After plant shut down all empplyees got lung disease and Mackenzie Peterson got TB ruled out but no bronch recollected. Was  told Occupational lung disease NOS. No hx of chronic prednisone but positive for only intermittent prednisone  CT chest Jun 21, 2009  IMPRESSION:  1. Innumerable small pulmonary nodules throughout all lobes of both  lungs. Primary considerations include infectious chronic  granulomatous disease (fungal or tuberculosis), or metastatic  disease. Findings discussed with Dr.Osei-Bonsu by telephone 10:40  am 06/21/2009.  2. Calcified right hilar lymph node is compatible with a history  of prior granulomatous disease.  3. Negative for lymphadenopathy, pleural effusion, or suspicious  bony lesion.  4. Probable right hepatic lobe cyst.  Provider: Joline Maxcy        Past, Family, Social reviewed: dementia has progresses. No short term memory. Can talk sentences. Can getup and  Walk few to several feet but has incontinence. No mood agression. Mostly passive. Speaking has reduced but still speaks full sentences. 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 If this does not work, we can try hycodan  - We will call Pulmonary Cesar Darnelle Maffucci MD, [address], [place], [place] ? at [phone] ?cell . [phone] and get records faxed. I wll try to talk to him  - We will call [name], MD about wheel chair -you should qualify on basis of dementia  Return to see me in 3 months  Call [phone] anytime there are problems     OV 06/11/2012 Followup interstitial lung disease not otherwise specified with palliative approach  - Presents with Mackenzie Peterson daughter-in-law. Respiratory status has been stable. Mackenzie Peterson uses oxygen at night. There  is no major deterioration. In terms of Mackenzie Peterson dementia Mackenzie Peterson continues to be stable. Only issue is some spring allergies and nasal drainage. We discussed repeat CT scan of the chest  For Mackenzie Peterson interstitial lung disease monitoring because Mackenzie Peterson cannot do pulmonary function test due to dementia. The daughter he wants to hold off at this point. We are still awaiting old records from [place]. We called the office of Dr. Janine Ores and again they say that they never got the release.   Past, Family, Social reviewed: no change since last visit  #Nasal allergies  - try  take generic fluticasone inhaler 2 squirts each nostril daily   #Interstitial Lung disease  - continue to watch clinically; currently appears stable - we will hold off CT chest as discussed - will try again to get information from Dr Janine Ores office  #Followup  - 6 months or sooner if needed  OV 12/19/2012  Followup interstitial lung disease not otherwise specified/arti occupational  exposure with Nestl with palliative approach in a dementia Mackenzie Peterson  =- Last visit was in  May 2014. Mackenzie Peterson presents now again with Mackenzie Peterson daughter-in-law. Son continues to be in [place] for the last 4 years. Rest per status is being stable. Uses oxygen at night. No major deterioration. Daughter-in-law believes dementia is worse. The use nasal steroids as needed. Daughter are not again interested in CT scan of the chest to follow progress. No major issues  Past and social reviewed: Son continues to be in [place]. No change in past medical history  Review of Systems  Constitutional: Negative for fever and unexpected weight change.  HENT: Negative for congestion, dental problem, ear pain, nosebleeds, postnasal drip, rhinorrhea, sinus pressure, sneezing, sore throat and trouble swallowing.   Eyes: Negative for redness and itching.  Respiratory: Negative for cough, chest tightness, shortness of breath and wheezing.   Cardiovascular: Negative for palpitations and leg swelling.   Gastrointestinal: Negative for nausea and vomiting.  Genitourinary: Negative for dysuria.  Musculoskeletal: Negative for joint swelling.  Skin: Negative for rash.  Neurological: Negative for headaches.  Hematological: Does not bruise/bleed easily.  Psychiatric/Behavioral: Negative for dysphoric mood. The Mackenzie Peterson is not nervous/anxious.        Objective:   Physical Exam w/c bound frail elderly hispanic Peterson lets Mackenzie Peterson Grand-daughter-in-law do Mackenzie Peterson speaking  HEENT: nl dentition, turbinates, and orophanx. Nl external ear canals without cough reflex   NECK :  without JVD/Nodes/TM/ nl carotid upstrokes bilaterally   LUNGS: no acc muscle use, clear to A and P bilaterally without cough on insp or exp maneuvers. Some scattered anteriopr crackles   CV:  RRR  no s3 or murmur or increase in P2, no edema   ABD:  soft and nontender with nl excursion in the supine position. No bruits or organomegaly, bowel sounds nl  MS:  warm without deformities, calf tenderness, cyanosis or clubbing  SKIN: warm and dry without lesions    NEURO:  Alert abd Oriented intermittently knows day of week per fm            Assessment & Plan:

## 2012-12-19 NOTE — Assessment & Plan Note (Signed)
#  Nasal allergies  - try  take generic fluticasone inhaler 2 squirts each nostril as needed   #Interstitial Lung disease  - continue to watch clinically; currently appears stable - we will hold off CT chest as discussed - glad you had flu shot  #Followup  - 9 months or sooner if needed   

## 2013-01-18 ENCOUNTER — Other Ambulatory Visit: Payer: Self-pay | Admitting: Internal Medicine

## 2013-01-28 ENCOUNTER — Other Ambulatory Visit: Payer: Self-pay | Admitting: Internal Medicine

## 2013-01-29 ENCOUNTER — Ambulatory Visit: Payer: Medicare Other | Admitting: Neurology

## 2013-08-09 ENCOUNTER — Other Ambulatory Visit: Payer: Self-pay | Admitting: Internal Medicine

## 2014-01-07 ENCOUNTER — Ambulatory Visit (INDEPENDENT_AMBULATORY_CARE_PROVIDER_SITE_OTHER)
Admission: RE | Admit: 2014-01-07 | Discharge: 2014-01-07 | Disposition: A | Discharge status: Home / Self Care | Payer: Medicare Other | Source: Ambulatory Visit | Attending: Internal Medicine | Admitting: Internal Medicine

## 2014-01-07 ENCOUNTER — Encounter: Payer: Self-pay | Admitting: Internal Medicine

## 2014-01-07 ENCOUNTER — Ambulatory Visit (INDEPENDENT_AMBULATORY_CARE_PROVIDER_SITE_OTHER): Payer: Medicare Other | Admitting: Internal Medicine

## 2014-01-07 ENCOUNTER — Ambulatory Visit: Payer: Medicare Other | Admitting: Internal Medicine

## 2014-01-07 VITALS — BP 110/58 | HR 82

## 2014-01-07 DIAGNOSIS — R059 Cough, unspecified: Secondary | ICD-10-CM

## 2014-01-07 DIAGNOSIS — R05 Cough: Secondary | ICD-10-CM

## 2014-01-07 DIAGNOSIS — J9611 Chronic respiratory failure with hypoxia: Secondary | ICD-10-CM

## 2014-01-07 DIAGNOSIS — J4530 Mild persistent asthma, uncomplicated: Secondary | ICD-10-CM

## 2014-01-07 MED ORDER — PREDNISONE 10 MG PO TABS: ORAL_TABLET | ORAL | Status: DC

## 2014-01-07 MED ORDER — AZITHROMYCIN 250 MG PO TABS: ORAL_TABLET | ORAL | Status: DC

## 2014-01-07 NOTE — Progress Notes (Signed)
Quick Note:  Spoke with pt's daughter, Natalia LeatherwoodKatherine and notified of results per Dr. Sherene SiresWert. She verbalized understanding and denied any questions.  ______

## 2014-01-07 NOTE — Patient Instructions (Signed)
zpak Prednisone 10 mg take  4 each am x 2 days,   2 each am x 2 days,  1 each am x 2 days and stop   GERD (REFLUX)  is an extremely common cause of respiratory symptoms just like yours , many times with no obvious heartburn at all.    It can be treated with medication, but also with lifestyle changes including avoidance of late meals, excessive alcohol, smoking cessation, and avoid fatty foods, chocolate, peppermint, colas, red wine, and acidic juices such as orange juice.  NO MINT OR MENTHOL PRODUCTS SO NO COUGH DROPS  USE SUGARLESS CANDY INSTEAD (Jolley ranchers or Stover's or Life Savers) or even ice chips will also do - the key is to swallow to prevent all throat clearing. NO OIL BASED VITAMINS - use powdered substitutes.  For cough > delsym 2 tsp every 12 hours as needed  Please remember to go to the x-ray department downstairs for your tests - we will call you with the results when they are available.

## 2014-01-07 NOTE — Progress Notes (Signed)
Subjective:    Patient ID: Mackenzie Peterson, female    DOB: Aug 07, 1924, 80 y.o.   MRN: [id number]  HPI IOV 10/26/2009/ Ramaswamy:     [place] immigrant with dementia. Never smoker but pasive smoker trhough age mid-30s.. Brought in by daughter in law Darryl NestleCatherine Garcia who is interpreter. [name] states [place] in Jan [place] to be with son. Had pulmonologist [facility]. [name] also saw a [facility] Hen[facility] [place]. 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 Patient states moderate cough > 20 years. STable. Worse in morning and worse at night. Sometimes dry and sometimes mucus. Associated chronic daily GERD + and post nasal drip +. Cough made worse by "any effort" such as picking up stuff, walking. Cough relieved by relaxingand resting.  Cough is associated with dyspnea. She states she gets dyspneic walking. Dyspnea relieved by rest. Dyspnea is present for activities like changing clothes.  Only outside records available is from Dr Julio Sickssei-Bonsu. THere are some spirometries that shows restriction and one that shows obstrictuion     Admit date: 01/05/2012  Discharge date: 01/08/2012    Discharge Diagnoses:  Pneumonia  Acute on chronic respiratory failure - stas 86% room air - patient placed on 2 liters of oxygen 24 hrs/day  HYPOTHYROIDISM  DEPRESSION  ASTHMA  Leukocytosis - resolved  Abdominal pain - resolved  Dementia    01/16/2012  Mackenzie Peterson [age] yohispanic female  In for post hosp f/u ov cc appetite better, now 24/7 2lpm where previously on just bedtime,  Cough is back to baseline, dry daily not really interfering with sleep.  Daughter convinced neb helping up to one or twice daily - using prilosec before supper.  Sleeping ok without nocturnal  or early am exacerbation  of respiratory  c/o's or need for noct saba. Also denies any obvious fluctuation of symptoms with weather or environmental changes or other  aggravating or alleviating factors except as outlined above   REC  Change omeprazole Take 30- 60 min before your first and last meals of the day  GERD (REFLUX) is an extremely common cause of respiratory symptoms, many times with no significant heartburn at all.  It can be treated with medication, but also with lifestyle changes including avoidance of late meals, excessive alcohol, smoking cessation, and avoid fatty foods, chocolate, peppermint, colas, red wine, and acidic juices such as orange juice.  NO MINT OR MENTHOL PRODUCTS SO NO COUGH DROPS  USE SUGARLESS CANDY INSTEAD (jolley ranchers or Stover's)  NO OIL BASED VITAMINS - use powdered substitutes.  Please remember to go to the lab and x-ray department downstairs for your tests - we will call you with the results when they are available.  Please schedule a follow up office visit in 4 weeks, sooner if needed to see Dr Marchelle Gearingamaswamy    OV 02/16/2012   Followup cough with ILD NOS.   - Personallyu not seen since sept 2011. Presents with daughter in law who is highly involved and knows all details. Hx is from daughter in law because patient has moderate dementia. Per hx: Resp issue overall stable. AFter recent admit: cough has improved esp with starting oxygen. Now on o2 x 24/7. Daughter in law expressing symptom based care with careful weightage of tests and medications. I still have not gotten old records. Daugher in Social workerlaw says she signed release. I called Dr Janine [place]Pena [place]  and they have not received any release. PAtient was last seen there in 2009 per them. 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 Was told Occupational lung disease NOS. No hx of chronic prednisone but positive for only intermittent prednisone  CT chest Jun 21, 2009  IMPRESSION:   1. Innumerable small pulmonary nodules throughout all lobes of both  lungs. Primary considerations include infectious chronic  granulomatous disease (fungal or tuberculosis), or metastatic  disease. Findings discussed with Dr.Osei-Bonsu by telephone 10:40  am 06/21/2009.  2. Calcified right hilar lymph node is compatible with a history  of prior granulomatous disease.  3. Negative for lymphadenopathy, pleural effusion, or suspicious  bony lesion.  4. Probable right hepatic lobe cyst.  Provider: Joline Maxcy        Past, Family, Social reviewed: dementia has progresses. No short term memory. Can talk sentences. Can getup and  Walk few to several feet but has incontinence. No mood agression. Mostly passive. Speaking has reduced but still speaks full sentences. 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 If this does not work, we can try hycodan  - We will call Pulmonary Cesar Darnelle Maffucci MD, [address], [place], [place] ? at [phone] ?cell . [phone] and get records faxed. I wll try to talk to him  - We will call [name], MD about wheel chair -you should qualify on basis of dementia  Return to see me in 3 months  Call [phone] anytime there are problems     OV 06/11/2012 Followup interstitial lung disease not otherwise specified with palliative approach  - Presents with her daughter-in-law. Respiratory status has been stable. She uses oxygen at night. There is no major deterioration. In terms of her dementia she continues to be stable. Only issue is some spring allergies and nasal drainage. We discussed  repeat CT scan of the chest  For her interstitial lung disease monitoring because she cannot do pulmonary function test due to dementia. The daughter he wants to hold off at this point. We are still awaiting old records from [place]. We called the office of Dr. Janine Ores and again they say that they never got the release.   01/07/2014 acute ov/Mackenzie Peterson re: noct 02 dep/ daytime room - room Chief Complaint  Patient presents with  . Acute Visit    Pt c/o cough, SOB and wheezing- taking neb every 4-6 hours. Her cough is non prod. She gets out of breath with any exertion.     abupt onset x4 d cough worse at hs/ no excess or purulent secretions Sore throat but swallowing ok and not choking on food  Comfortable at rest p nebs  No obvious day to day or daytime variabilty or assoc chronic cough or cp or chest tightness, subjective wheeze overt sinus or hb symptoms. No unusual exp hx or h/o childhood pna/ asthma or knowledge of premature birth.  Sleeping  ok without nocturnal  or early am exacerbation  of respiratory  c/o's or need for noct saba. Also denies any obvious fluctuation of symptoms with weather or environmental changes or other aggravating or alleviating factors except as outlined above   Current Medications, Allergies, Complete Past Medical History, Past Surgical History, Family History, and Social History were reviewed in [facility] electronic medical record.  ROS  The following are not active complaints unless bolded sore throat, dysphagia, dental problems, itching, sneezing,  nasal congestion or excess/ purulent secretions, ear ache,   fever, chills, sweats, unintended wt loss, pleuritic or exertional cp, hemoptysis,  orthopnea pnd or leg swelling, presyncope, palpitations, heartburn, abdominal pain, anorexia, nausea, vomiting, diarrhea  or change in bowel or urinary habits, change in stools or urine, dysuria,hematuria,  rash, arthralgias, visual complaints, headache, numbness weakness or ataxia  or problems with walking or coordination,  change in mood/affect or memory.                Objective:   Physical Exam  Wt Readings from Last 3 Encounters:  12/19/12 136 lb 12.8 oz (62.052 kg)  06/11/12 130 lb (58.968 kg)  02/16/12 129 lb 3.2 oz (58.605 kg)    Vital signs reviewed   w/c bound frail elderly hispanic female lets her Grand-daughter-in-law do her speaking  HEENT: nl dentition, turbinates, and orophanx. Nl external ear canals without cough reflex   NECK :  without JVD/Nodes/TM/ nl carotid upstrokes bilaterally   LUNGS: no acc muscle use, min exp rhonchi bilaterally w/in 4 h of last neb    CV:  RRR  no s3 or murmur or increase in P2, no edema   ABD:  soft and nontender with nl excursion in the supine position. No bruits or organomegaly, bowel sounds nl  MS:  warm without deformities, calf tenderness, cyanosis or clubbing  SKIN: warm and dry without lesions        cxr 01/07/14 Chronic fibrotic changes stable from the previous exam.        Assessment & Plan:

## 2014-01-09 NOTE — Assessment & Plan Note (Signed)
sats ok on RA at rest despite flare > no change recs

## 2014-01-09 NOTE — Assessment & Plan Note (Addendum)
Explained the natural history of uri and why it's necessary in patients at risk to treat GERD aggressively - at least  short term -   to reduce risk of evolving cyclical cough initially  triggered by epithelial injury and a heightened sensitivty to the effects of any upper airway irritants,  most importantly acid - related - then perpetuated by epithelial injury related to the cough itself as the upper airway collapses on itself.  That is, the more sensitive the epithelium becomes once it is damaged by the virus, the more the ensuing irritability> the more the cough, the more the secondary reflux (especially in those prone to reflux) the more the irritation of the sensitive mucosa and so on in a  Classic cyclical pattern.    In meantime rx with zpak and 6 days only prednisone

## 2014-01-09 NOTE — Assessment & Plan Note (Signed)
Adequate control on present rx, reviewed > no change in rx needed  > pnr nebs all that's needed for now

## 2018-03-02 ENCOUNTER — Emergency Department (HOSPITAL_BASED_OUTPATIENT_CLINIC_OR_DEPARTMENT_OTHER): Payer: Medicare Other

## 2018-03-02 ENCOUNTER — Encounter (HOSPITAL_BASED_OUTPATIENT_CLINIC_OR_DEPARTMENT_OTHER): Payer: Self-pay | Admitting: Emergency Medicine

## 2018-03-02 ENCOUNTER — Other Ambulatory Visit: Payer: Self-pay

## 2018-03-02 ENCOUNTER — Emergency Department (HOSPITAL_BASED_OUTPATIENT_CLINIC_OR_DEPARTMENT_OTHER)
Admission: EM | Admit: 2018-03-02 | Discharge: 2018-03-02 | Disposition: A | Discharge status: Home / Self Care | Payer: Medicare Other | Attending: Emergency Medicine | Admitting: Emergency Medicine

## 2018-03-02 DIAGNOSIS — J45909 Unspecified asthma, uncomplicated: Secondary | ICD-10-CM | POA: Insufficient documentation

## 2018-03-02 DIAGNOSIS — Z79899 Other long term (current) drug therapy: Secondary | ICD-10-CM | POA: Insufficient documentation

## 2018-03-02 DIAGNOSIS — I10 Essential (primary) hypertension: Secondary | ICD-10-CM | POA: Diagnosis not present

## 2018-03-02 DIAGNOSIS — F0280 Dementia in other diseases classified elsewhere without behavioral disturbance: Secondary | ICD-10-CM | POA: Insufficient documentation

## 2018-03-02 DIAGNOSIS — Z7982 Long term (current) use of aspirin: Secondary | ICD-10-CM | POA: Insufficient documentation

## 2018-03-02 DIAGNOSIS — R05 Cough: Secondary | ICD-10-CM | POA: Diagnosis present

## 2018-03-02 DIAGNOSIS — J209 Acute bronchitis, unspecified: Secondary | ICD-10-CM | POA: Diagnosis not present

## 2018-03-02 DIAGNOSIS — G309 Alzheimer's disease, unspecified: Secondary | ICD-10-CM | POA: Diagnosis not present

## 2018-03-02 DIAGNOSIS — E039 Hypothyroidism, unspecified: Secondary | ICD-10-CM | POA: Diagnosis not present

## 2018-03-02 DIAGNOSIS — F329 Major depressive disorder, single episode, unspecified: Secondary | ICD-10-CM | POA: Diagnosis not present

## 2018-03-02 LAB — BASIC METABOLIC PANEL
Anion gap: 5 (ref 5–15)
BUN: 26 mg/dL — ABNORMAL HIGH (ref 8–23)
CALCIUM: 8.3 mg/dL — AB (ref 8.9–10.3)
CO2: 28 mmol/L (ref 22–32)
Chloride: 102 mmol/L (ref 98–111)
Creatinine, Ser: 0.92 mg/dL (ref 0.44–1.00)
GFR calc Af Amer: >60 mL/min (ref >60)
GFR calc non Af Amer: 54 mL/min — ABNORMAL LOW (ref >60)
Glucose, Bld: 116 mg/dL — ABNORMAL HIGH (ref 70–99)
Potassium: 3.5 mmol/L (ref 3.5–5.1)
Sodium: 135 mmol/L (ref 135–145)

## 2018-03-02 LAB — CBC WITH DIFFERENTIAL/PLATELET
Abs Immature Granulocytes: 0.02 10*3/uL (ref 0.00–0.07)
Basophils Absolute: 0 10*3/uL (ref 0.0–0.1)
Basophils Relative: 0 %
EOS PCT: 2 %
Eosinophils Absolute: 0.1 10*3/uL (ref 0.0–0.5)
HCT: 45.9 % (ref 36.0–46.0)
Hemoglobin: 14.5 g/dL (ref 12.0–15.0)
Immature Granulocytes: 0 %
Lymphocytes Relative: 20 %
Lymphs Abs: 1.5 10*3/uL (ref 0.7–4.0)
MCH: 30.2 pg (ref 26.0–34.0)
MCHC: 31.6 g/dL (ref 30.0–36.0)
MCV: 95.6 fL (ref 80.0–100.0)
Monocytes Absolute: 0.6 10*3/uL (ref 0.1–1.0)
Monocytes Relative: 9 %
Neutro Abs: 4.9 10*3/uL (ref 1.7–7.7)
Neutrophils Relative %: 69 %
Platelets: 193 10*3/uL (ref 150–400)
RBC: 4.8 MIL/uL (ref 3.87–5.11)
RDW: 12.2 % (ref 11.5–15.5)
WBC: 7.2 10*3/uL (ref 4.0–10.5)
nRBC: 0 % (ref 0.0–0.2)

## 2018-03-02 LAB — INFLUENZA PANEL BY PCR (TYPE A & B)
Influenza A By PCR: NEGATIVE
Influenza B By PCR: NEGATIVE

## 2018-03-02 MED ORDER — DOXYCYCLINE HYCLATE 100 MG PO TABS
100.0000 mg | ORAL_TABLET | Freq: Once | ORAL | Status: AC
  Administered 2018-03-02: 100 mg via ORAL
  Filled 2018-03-02: qty 1

## 2018-03-02 MED ORDER — DOXYCYCLINE HYCLATE 100 MG PO TABS: 100.0000 mg | ORAL_TABLET | Freq: Once | ORAL | Status: DC

## 2018-03-02 MED ORDER — DOXYCYCLINE HYCLATE 100 MG PO CAPS: 100.0000 mg | ORAL_CAPSULE | Freq: Two times a day (BID) | ORAL | 0 refills | Status: DC

## 2018-03-02 NOTE — ED Notes (Signed)
ED Provider at bedside. 

## 2018-03-02 NOTE — ED Notes (Signed)
Patient is with daughter-in-law who expresses extreme caregiver role strain. Husband is Leisure centre manager in [place] for the last 10 years, she has 3 children and is taking care of patient 24/7. Patient's DIL is tearful and is in constant pain from Fibromyalgia with little support. Husband does not want his mother to go to a SNF despite progressing Alzheimer's due to cultural beliefs.

## 2018-03-02 NOTE — ED Triage Notes (Addendum)
Right ear pain x 3 days and worse cough than usual has COPD, chills x 1 week
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
## 2018-03-02 NOTE — Discharge Instructions (Addendum)
You were seen in the emergency department for worsening cough over 2 weeks.  Your blood work and chest x-ray did not show an obvious cause of your symptoms.  This is likely a bronchitis and we are starting you on some antibiotics.  It will be important for you to finish them.  Please try to stay well-hydrated and follow-up with your doctor.

## 2019-04-30 ENCOUNTER — Other Ambulatory Visit: Payer: Self-pay

## 2019-04-30 ENCOUNTER — Emergency Department (HOSPITAL_COMMUNITY)
Admission: EM | Admit: 2019-04-30 | Discharge: 2019-05-01 | Disposition: A | Discharge status: Home / Self Care | Payer: Medicare Other | Attending: Emergency Medicine | Admitting: Emergency Medicine

## 2019-04-30 ENCOUNTER — Encounter (HOSPITAL_COMMUNITY): Payer: Self-pay

## 2019-04-30 ENCOUNTER — Emergency Department (HOSPITAL_COMMUNITY): Payer: Medicare Other

## 2019-04-30 DIAGNOSIS — R0602 Shortness of breath: Secondary | ICD-10-CM | POA: Diagnosis not present

## 2019-04-30 DIAGNOSIS — R05 Cough: Secondary | ICD-10-CM | POA: Diagnosis not present

## 2019-04-30 DIAGNOSIS — Z7982 Long term (current) use of aspirin: Secondary | ICD-10-CM | POA: Diagnosis not present

## 2019-04-30 DIAGNOSIS — I10 Essential (primary) hypertension: Secondary | ICD-10-CM | POA: Diagnosis not present

## 2019-04-30 DIAGNOSIS — J45909 Unspecified asthma, uncomplicated: Secondary | ICD-10-CM | POA: Diagnosis not present

## 2019-04-30 DIAGNOSIS — E039 Hypothyroidism, unspecified: Secondary | ICD-10-CM | POA: Diagnosis not present

## 2019-04-30 DIAGNOSIS — R059 Cough, unspecified: Secondary | ICD-10-CM

## 2019-04-30 DIAGNOSIS — F0390 Unspecified dementia without behavioral disturbance: Secondary | ICD-10-CM | POA: Insufficient documentation

## 2019-04-30 DIAGNOSIS — Z79899 Other long term (current) drug therapy: Secondary | ICD-10-CM | POA: Diagnosis not present

## 2019-04-30 LAB — CBC WITH DIFFERENTIAL/PLATELET
Abs Immature Granulocytes: 0.03 10*3/uL (ref 0.00–0.07)
Basophils Absolute: 0.1 10*3/uL (ref 0.0–0.1)
Basophils Relative: 1 %
Eosinophils Absolute: 0.5 10*3/uL (ref 0.0–0.5)
Eosinophils Relative: 6 %
HCT: 40.6 % (ref 36.0–46.0)
Hemoglobin: 12.8 g/dL (ref 12.0–15.0)
Immature Granulocytes: 0 %
Lymphocytes Relative: 26 %
Lymphs Abs: 2.1 10*3/uL (ref 0.7–4.0)
MCH: 30.7 pg (ref 26.0–34.0)
MCHC: 31.5 g/dL (ref 30.0–36.0)
MCV: 97.4 fL (ref 80.0–100.0)
Monocytes Absolute: 0.6 10*3/uL (ref 0.1–1.0)
Monocytes Relative: 8 %
Neutro Abs: 4.7 10*3/uL (ref 1.7–7.7)
Neutrophils Relative %: 59 %
Platelets: 268 10*3/uL (ref 150–400)
RBC: 4.17 MIL/uL (ref 3.87–5.11)
RDW: 12.7 % (ref 11.5–15.5)
WBC: 8.1 10*3/uL (ref 4.0–10.5)
nRBC: 0 % (ref 0.0–0.2)

## 2019-04-30 LAB — COMPREHENSIVE METABOLIC PANEL
ALT: 10 U/L (ref 0–44)
AST: 17 U/L (ref 15–41)
Albumin: 2.9 g/dL — ABNORMAL LOW (ref 3.5–5.0)
Alkaline Phosphatase: 74 U/L (ref 38–126)
Anion gap: 9 (ref 5–15)
BUN: 27 mg/dL — ABNORMAL HIGH (ref 8–23)
CO2: 29 mmol/L (ref 22–32)
Calcium: 8.4 mg/dL — ABNORMAL LOW (ref 8.9–10.3)
Chloride: 104 mmol/L (ref 98–111)
Creatinine, Ser: 0.83 mg/dL (ref 0.44–1.00)
GFR calc Af Amer: >60 mL/min (ref >60)
GFR calc non Af Amer: >60 mL/min (ref >60)
Glucose, Bld: 128 mg/dL — ABNORMAL HIGH (ref 70–99)
Potassium: 3.9 mmol/L (ref 3.5–5.1)
Sodium: 142 mmol/L (ref 135–145)
Total Bilirubin: 0.1 mg/dL — ABNORMAL LOW (ref 0.3–1.2)
Total Protein: 5.9 g/dL — ABNORMAL LOW (ref 6.5–8.1)

## 2019-04-30 LAB — LACTIC ACID, PLASMA
Lactic Acid, Venous: 1.7 mmol/L (ref 0.5–1.9)
Lactic Acid, Venous: 2.1 mmol/L (ref 0.5–1.9)

## 2019-04-30 MED ORDER — PREDNISONE 20 MG PO TABS
60.0000 mg | ORAL_TABLET | Freq: Once | ORAL | Status: AC
  Administered 2019-05-01: 60 mg via ORAL
  Filled 2019-04-30: qty 3

## 2019-04-30 MED ORDER — DOXYCYCLINE HYCLATE 100 MG PO CAPS: 100.0000 mg | ORAL_CAPSULE | Freq: Two times a day (BID) | ORAL | 0 refills | Status: DC

## 2019-04-30 MED ORDER — BENZONATATE 100 MG PO CAPS: 100.0000 mg | ORAL_CAPSULE | Freq: Three times a day (TID) | ORAL | 0 refills | Status: DC

## 2019-04-30 MED ORDER — DOXYCYCLINE HYCLATE 100 MG PO TABS
100.0000 mg | ORAL_TABLET | Freq: Once | ORAL | Status: AC
  Administered 2019-05-01: 100 mg via ORAL
  Filled 2019-04-30: qty 1

## 2019-04-30 MED ORDER — IOHEXOL 350 MG/ML SOLN
80.0000 mL | Freq: Once | INTRAVENOUS | Status: AC | PRN
  Administered 2019-04-30: 22:00:00 80 mL via INTRAVENOUS

## 2019-04-30 MED ORDER — PREDNISONE 20 MG PO TABS: 40.0000 mg | ORAL_TABLET | Freq: Every day | ORAL | 0 refills | Status: DC

## 2019-04-30 MED ORDER — SODIUM CHLORIDE 0.9 % IV BOLUS
500.0000 mL | Freq: Once | INTRAVENOUS | Status: AC
  Administered 2019-04-30: 500 mL via INTRAVENOUS

## 2019-04-30 NOTE — ED Provider Notes (Signed)
[facility] EMERGENCY DEPARTMENT Provider Note   CSN: [id number] Arrival date & time: 04/30/19  1837     History Chief Complaint  Patient presents with  . Shortness of Breath    Mackenzie Peterson is a 86 y.o. female.  Patient with history of dementia, numerous pulmonary nodules demonstrated on multiple x-rays and by CT in the past 10 years, history of chronic respiratory failure due to interstitial lung disease, COPD --presents to the emergency department today with complaint of cough and shortness of breath.  Patient was seen by doctors making house calls today.  She has been having coughing spells.  During these times her oxygen level seems to drop, documented in the 60% range by daughter-in-law with whom the patient lives.  She is a Engineer, site.  During the spells, daughter-in-law report cyanosis.  Patient denies any chest pain, fevers, sore throat to family.  No vomiting or diarrhea.  Patient typically eats breakfast and then has poor oral intake the rest of the day.  Evaluation today included a chest x-ray.  Family states that they were told patient has "double pneumonia" and that her x-ray appeared to be much worse than in the past.  She was encouraged to come to the emergency department.  On arrival patient is not hypoxic, in no respiratory distress.  No active cough.  Patient was given an albuterol treatment prior to arrival by family.        Past Medical History:  Diagnosis Date  . Alzheimer disease (HCC)   . Asthma   . Chronic bronchitis (HCC)   . Dysthymic disorder   . Emphysema   . Hyperlipidemia   . Hypertension   . hypothyroid   . PVD (peripheral vascular disease) [facility])     Patient Active Problem List   Diagnosis Date Noted  . 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 Dementia (HCC) 01/06/2012  . Pneumonia  01/06/2012  . Leukocytosis 01/05/2012  . Abdominal pain 01/05/2012  . HYPOTHYROIDISM 10/26/2009  . DEPRESSION 10/26/2009  . Mild persistent chronic asthma without complication 10/26/2009  . SHORTNESS OF BREATH (SOB) 10/26/2009  . COUGH 10/26/2009    History reviewed. No pertinent surgical history.   OB History   No obstetric history on file.     History reviewed. No pertinent family history.  Social History   Tobacco Use  . Smoking status: Never Smoker  . Smokeless tobacco: Never Used  Substance Use Topics  . Alcohol use: No  . Drug use: No    Home Medications Prior to Admission medications   Medication Sig Start Date End Date Taking? Authorizing Provider  albuterol (PROVENTIL) (2.5 MG/3ML) 0.083% nebulizer solution Take 2.5 mg by nebulization every 6 (six) hours as needed. For wheezing or shortness of breath    [provider]  ALPRAZolam (XANAX) 0.5 MG tablet Take 0.5 mg by mouth 2 (two) times daily.    [provider]  aspirin 81 MG tablet Take 81 mg by mouth daily.    [provider]  azithromycin (ZITHROMAX) 250 MG tablet Take 2 on day one then 1 daily x 4 days 01/07/14   Nyoka Cowden, MD  cetirizine (ZYRTEC) 10 MG tablet Take 10 mg by mouth at bedtime.    [provider]  donepezil (ARICEPT) 10 MG tablet Take 10 mg by mouth at bedtime.    [provider]  doxycycline (VIBRAMYCIN) 100 MG capsule Take 1 capsule (100 mg total) by mouth 2 (two) times daily. 03/02/18   Terrilee Files, MD  escitalopram (LEXAPRO) 20 MG tablet Take 20 mg by mouth at bedtime.    [provider]  levothyroxine (SYNTHROID, LEVOTHROID) 25 MCG tablet Take 25 mcg by mouth daily.    [provider]  memantine (NAMENDA) 10 MG tablet Take 10 mg by mouth 2 (two) times daily.    [provider]  nitroGLYCERIN (NITROSTAT) 0.4 MG SL tablet Place 0.4 mg under the tongue every 5 (five) minutes as needed. For chest pain    [provider]  omeprazole (PRILOSEC) 20 MG capsule TAKE 1 CAPSULE BY MOUTH 30 TO 60 MINUTES BEFORE THE FIRST AND LAST MEALS OF THE DAY    Kalman Shan, MD  predniSONE (DELTASONE) 10 MG tablet Take  4 each am x 2 days,   2 each am x 2 days,  1 each am x 2 days and stop 01/07/14   Nyoka Cowden, MD  QUEtiapine (SEROQUEL) 50 MG tablet Take 50 mg by mouth 2 (two) times daily.    [provider]  solifenacin (VESICARE) 5 MG tablet Take 10 mg by mouth at bedtime.    [provider]  traZODone (DESYREL) 50 MG tablet Take 50 mg by mouth at bedtime.    [provider]    Allergies    Patient has no known allergies.  Review of Systems   Review of Systems  Constitutional: Negative for fever.  HENT: Negative for rhinorrhea and sore throat.   Eyes: Negative for redness.  Respiratory: Positive for cough and shortness of breath. Negative for wheezing.   Cardiovascular: Negative for chest pain.  Gastrointestinal: Negative for abdominal pain, diarrhea, nausea and vomiting.  Genitourinary: Negative for dysuria.  Musculoskeletal: Negative for myalgias.  Skin: Negative for rash.  Neurological: Negative for headaches.    Physical Exam Updated Vital Signs BP 136/73 (BP Location: Right Arm)   Pulse 80   Temp 98.8 F (37.1 C) (Oral)   Resp 16   Ht 4\' 11"  (1.499 m)   Wt 50 kg   SpO2 98%   BMI 22.26 kg/m   Physical Exam Vitals and nursing note reviewed.  Constitutional:      Appearance: She is well-developed.  HENT:     Head: Normocephalic and atraumatic.  Eyes:     General:        Right eye: No discharge.        Left eye: No discharge.     Conjunctiva/sclera: Conjunctivae normal.  Cardiovascular:     Rate and Rhythm: Normal rate and regular rhythm.     Heart sounds: Normal heart sounds.  Pulmonary:     Effort: Pulmonary effort is normal.     Breath sounds: Rales (Patient with coarse rales in all fields consistent with history of interstitial lung  disease) present.     Comments: No coughing during exam.  No apparent respiratory distress.  Oxygen level in the upper 90s on room air. Abdominal:     Palpations: Abdomen is soft.     Tenderness: There is no abdominal tenderness.  Musculoskeletal:     Cervical back: Normal range of motion and neck supple.  Skin:    General: Skin is warm and dry.  Neurological:     Mental Status: She is alert.     ED Results / Procedures / Treatments   Labs (all labs ordered  are listed, but only abnormal results are displayed) Labs Reviewed  LACTIC ACID, PLASMA - Abnormal; Notable for the following components:      Result Value   Lactic Acid, Venous 2.1 (*)    All other components within normal limits  COMPREHENSIVE METABOLIC PANEL - Abnormal; Notable for the following components:   Glucose, Bld 128 (*)    BUN 27 (*)    Calcium 8.4 (*)    Total Protein 5.9 (*)    Albumin 2.9 (*)    Total Bilirubin 0.1 (*)    All other components within normal limits  LACTIC ACID, PLASMA  CBC WITH DIFFERENTIAL/PLATELET  URINALYSIS, ROUTINE W REFLEX MICROSCOPIC    EKG EKG Interpretation  Date/Time:  Wednesday April 30 2019 18:38:30 EDT Ventricular Rate:  81 PR Interval:  132 QRS Duration: 122 QT Interval:  408 QTC Calculation: 473 R Axis:   -30 Text Interpretation: Normal sinus rhythm Left axis deviation Right bundle branch block Minimal voltage criteria for LVH, may be normal variant ( R in aVL ) Abnormal ECG Confirmed by Quintella Reichert [phone]) on 04/30/2019 8:37:11 PM   Radiology DG Chest 2 View  Result Date: 04/30/2019 CLINICAL DATA:  Cough and chest tightness for 2 months EXAM: CHEST - 2 VIEW COMPARISON:  03/02/2018 FINDINGS: Frontal and lateral views of the chest demonstrate a stable cardiac silhouette. Atherosclerosis aortic arch unchanged. Innumerable bilateral pulmonary nodules are again noted and unchanged. No airspace disease, effusion, or pneumothorax. No acute bony abnormalities.  IMPRESSION: 1. Stable innumerable small pulmonary nodules likely sequela of previous granulomatous disease. 2. No acute intrathoracic process. Electronically Signed   By: Randa Ngo M.D.   On: 04/30/2019 19:36    Procedures Procedures (including critical care time)  Medications Ordered in ED Medications  predniSONE (DELTASONE) tablet 60 mg (has no administration in time range)  doxycycline (VIBRA-TABS) tablet 100 mg (has no administration in time range)  sodium chloride 0.9 % bolus 500 mL (500 mLs Intravenous New Bag/Given 04/30/19 2249)  iohexol (OMNIPAQUE) 350 MG/ML injection 80 mL (80 mLs Intravenous Contrast Given 04/30/19 2215)    ED Course  I have reviewed the triage vital signs and the nursing notes.  Pertinent labs & imaging results that were available during my care of the patient were reviewed by me and considered in my medical decision making (see chart for details).  Patient seen and examined.  Chest x-ray here appears similar to previous.  No report of pneumonia on interpretation by our radiologist.  Patient is not hypoxic.  She is not coughing at current time.  It is concerning that she had documented low oxygen saturation and hypoxic spells with coughing episodes.  Will monitor here.  Will order CT of the chest to better delineate given discrepancies and history.  Daughter-in-law is in agreement.  Discussed with Dr. Ralene Bathe who will see.  Vital signs reviewed and are as follows: BP 136/73 (BP Location: Right Arm)   Pulse 80   Temp 98.8 F (37.1 C) (Oral)   Resp 16   Ht 4\' 11"  (1.499 m)   Wt 50 kg   SpO2 98%   BMI 22.26 kg/m   CT of the chest demonstrates pulmonary nodules as seen previously.  No significant we will treat.  No blood clot.  Current plan, treat with doxycycline, prednisone, Tessalon, discharged home with close PCP follow-up.  Discussed with patient's daughter-in-law at bedside.  She is in agreement.  Patient will need transportation back home as she is  not  ambulatory.    MDM Rules/Calculators/A&P                      Patient here tonight with concern for pneumonia.  She has been coughing.  Reported desaturation at home with coughing spells but then returns to baseline which is in the 90's.  Patient has been stable and well-appearing during ED stay.  CT imaging performed as above.  EKG without any signs of ischemia.  Patient appears nonseptic.  Normal white blood cell count.  Will cover for COPD exacerbation, atypical PNA.    Final Clinical Impression(s) / ED Diagnoses Final diagnoses:  Shortness of breath  Cough    Rx / DC Orders ED Discharge Orders    None       Renne Crigler, Cordelia Poche 04/30/19 2317    Tilden Fossa, MD 05/02/19 1018

## 2019-04-30 NOTE — Discharge Instructions (Signed)
Please read and follow all provided instructions.  Your diagnoses today include:  1. Shortness of breath   2. Cough     Tests performed today include:  Blood counts and electrolytes  CT scan of the chest -does not show significant pneumonia or any blood clots  Vital signs. See below for your results today.   Medications prescribed:   Doxycycline - antibiotic  You have been prescribed an antibiotic medicine: take the entire course of medicine even if you are feeling better. Stopping early can cause the antibiotic not to work.   Prednisone - steroid medicine   It is best to take this medication in the morning to prevent sleeping problems. If you are diabetic, monitor your blood sugar closely and stop taking Prednisone if blood sugar is over 300. Take with food to prevent stomach upset.    Tessalon Perles - cough suppressant medication  Take any prescribed medications only as directed.  Home care instructions:  Follow any educational materials contained in this packet.  BE VERY CAREFUL not to take multiple medicines containing Tylenol (also called acetaminophen). Doing so can lead to an overdose which can damage your liver and cause liver failure and possibly death.   Follow-up instructions: Please follow-up with your primary care provider in the next 2 days for further evaluation of your symptoms.   Return instructions:   Please return to the Emergency Department if you experience worsening symptoms.   Return with worsening shortness of breath or trouble breathing.  Please return if you have any other emergent concerns.  Additional Information:  Your vital signs today were: BP (!) 148/64    Pulse 91    Temp 98.8 F (37.1 C) (Oral)    Resp (!) 28    Ht 4\' 11"  (1.499 m)    Wt 50 kg    SpO2 94%    BMI 22.26 kg/m  If your blood pressure (BP) was elevated above 135/85 this visit, please have this repeated by your doctor within one month. --------------

## 2019-04-30 NOTE — ED Notes (Signed)
Changed BP cuff to smaller size and re-evaluated BP

## 2019-04-30 NOTE — ED Notes (Signed)
ptar called to return pt to home

## 2019-04-30 NOTE — ED Notes (Signed)
Ptar called for pt 

## 2019-04-30 NOTE — ED Notes (Signed)
Pt returned from ct

## 2019-04-30 NOTE — ED Triage Notes (Signed)
Pt BIB [facility] for eval of shortness of breath after being diagnosed w/ double PNA. Pt was written for abx today, daughter has not yet filled script. EMS reports that pt had a CXR done at home today and physician prescribed abx. Daughter reports that she was checking her home O2 w/ pulse ox and noted that she was satting in the 60s. EMS reports that she has been 94-96% on RA the entire transport. NARD on arrival, GCS 15.

## 2019-05-01 DIAGNOSIS — R0602 Shortness of breath: Secondary | ICD-10-CM | POA: Diagnosis not present

## 2019-05-07 ENCOUNTER — Encounter (HOSPITAL_BASED_OUTPATIENT_CLINIC_OR_DEPARTMENT_OTHER): Payer: Self-pay | Admitting: Emergency Medicine

## 2019-05-07 ENCOUNTER — Emergency Department (HOSPITAL_BASED_OUTPATIENT_CLINIC_OR_DEPARTMENT_OTHER)
Admission: EM | Admit: 2019-05-07 | Discharge: 2019-05-08 | Disposition: A | Discharge status: Home / Self Care | Payer: Medicare Other | Attending: Emergency Medicine | Admitting: Emergency Medicine

## 2019-05-07 ENCOUNTER — Emergency Department (HOSPITAL_BASED_OUTPATIENT_CLINIC_OR_DEPARTMENT_OTHER): Payer: Medicare Other

## 2019-05-07 ENCOUNTER — Other Ambulatory Visit: Payer: Self-pay

## 2019-05-07 DIAGNOSIS — Y92230 Patient room in hospital as the place of occurrence of the external cause: Secondary | ICD-10-CM | POA: Diagnosis not present

## 2019-05-07 DIAGNOSIS — W06XXXA Fall from bed, initial encounter: Secondary | ICD-10-CM | POA: Insufficient documentation

## 2019-05-07 DIAGNOSIS — S6992XA Unspecified injury of left wrist, hand and finger(s), initial encounter: Secondary | ICD-10-CM | POA: Diagnosis present

## 2019-05-07 DIAGNOSIS — W19XXXA Unspecified fall, initial encounter: Secondary | ICD-10-CM

## 2019-05-07 DIAGNOSIS — E039 Hypothyroidism, unspecified: Secondary | ICD-10-CM | POA: Diagnosis not present

## 2019-05-07 DIAGNOSIS — Z23 Encounter for immunization: Secondary | ICD-10-CM | POA: Insufficient documentation

## 2019-05-07 DIAGNOSIS — G309 Alzheimer's disease, unspecified: Secondary | ICD-10-CM | POA: Diagnosis not present

## 2019-05-07 DIAGNOSIS — Y9389 Activity, other specified: Secondary | ICD-10-CM | POA: Diagnosis not present

## 2019-05-07 DIAGNOSIS — Z79899 Other long term (current) drug therapy: Secondary | ICD-10-CM | POA: Insufficient documentation

## 2019-05-07 DIAGNOSIS — Y998 Other external cause status: Secondary | ICD-10-CM | POA: Insufficient documentation

## 2019-05-07 DIAGNOSIS — I10 Essential (primary) hypertension: Secondary | ICD-10-CM | POA: Insufficient documentation

## 2019-05-07 DIAGNOSIS — Z7982 Long term (current) use of aspirin: Secondary | ICD-10-CM | POA: Insufficient documentation

## 2019-05-07 DIAGNOSIS — S61412A Laceration without foreign body of left hand, initial encounter: Secondary | ICD-10-CM | POA: Diagnosis not present

## 2019-05-07 MED ORDER — TETANUS-DIPHTH-ACELL PERTUSSIS 5-2.5-18.5 LF-MCG/0.5 IM SUSP
0.5000 mL | Freq: Once | INTRAMUSCULAR | Status: AC
  Administered 2019-05-07: 0.5 mL via INTRAMUSCULAR
  Filled 2019-05-07: qty 0.5

## 2019-05-07 NOTE — ED Triage Notes (Signed)
Patient arrived via [facility] c/o fall with skin tear on left hand. Per patient family patient had mechanical fall from home hospital bed, skin tear to left hand. Patient family endorses dementia, unsure of LOC. Patient is AO x 2 per baseline. VS WDL, Patient uses wheelchair.

## 2019-05-07 NOTE — ED Provider Notes (Signed)
[facility] EMERGENCY DEPARTMENT Provider Note   CSN: [id number] Arrival date & time: 05/07/19  2240     History Chief Complaint  Patient presents with  . Fall    Mackenzie Peterson is a 86 y.o. female.  Level 5 caveat for dementia and language barrier.  Patient with history of dementia and Alzheimer's disease as well as COPD here with fall from bed.  Daughter states she somehow rolled out of her hospital bed and fell against the floor.  She is not certain if she lost consciousness or hit her head.  She sustained a skin tear to the left hand.  She is at her baseline mental status.  She does not take any blood thinners.  She does not wheelchair.  She was seen in the ED 1 week ago and treated with steroids as well as doxycycline for possible COPD exacerbation.  Daughter feels her mental status is at baseline.  She has no increased work of breathing.  She is not hypoxic. Does states she finished the steroids antibiotics today and has been breathing well.  The history is provided by the patient, the EMS personnel and a relative. The history is limited by the condition of the patient and a language barrier. A language interpreter was used.  Fall       Past Medical History:  Diagnosis Date  . Alzheimer disease (HCC)   . Asthma   . Chronic bronchitis (HCC)   . Dysthymic disorder   . Emphysema   . Hyperlipidemia   . Hypertension   . hypothyroid   . PVD (peripheral vascular disease) [facility])     Patient Active Problem List   Diagnosis Date Noted  . 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 Dementia (HCC) 01/06/2012  . Pneumonia 01/06/2012  . Leukocytosis 01/05/2012  . Abdominal pain 01/05/2012  . HYPOTHYROIDISM 10/26/2009  . DEPRESSION 10/26/2009  . Mild persistent chronic asthma without complication 10/26/2009  . SHORTNESS OF BREATH (SOB)  10/26/2009  . COUGH 10/26/2009    History reviewed. No pertinent surgical history.   OB History   No obstetric history on file.     History reviewed. No pertinent family history.  Social History   Tobacco Use  . Smoking status: Never Smoker  . Smokeless tobacco: Never Used  Substance Use Topics  . Alcohol use: No  . Drug use: No    Home Medications Prior to Admission medications   Medication Sig Start Date End Date Taking? Authorizing Provider  acetaminophen (TYLENOL) 500 MG tablet Take 500-1,000 mg by mouth every 6 (six) hours as needed for mild pain or headache.    [provider]  albuterol (PROVENTIL) (2.5 MG/3ML) 0.083% nebulizer solution Take 2.5 mg by nebulization 3 (three) times daily as needed for wheezing or shortness of breath.     [provider]  ALPRAZolam Prudy Feeler) 0.5 MG tablet Take 0.5 mg by mouth See admin instructions. Take 0.5 mg by mouth two to four times a day    [provider]  aspirin 81 MG tablet Take 81 mg by mouth daily.    [provider]  benzonatate (TESSALON) 100 MG capsule Take 1 capsule (100 mg total) by mouth every 8 (eight) hours. 04/30/19   Renne Crigler, PA-C  cetirizine (ZYRTEC) 10 MG tablet Take 10 mg by mouth at bedtime.    [provider]  Dextromethorphan-guaiFENesin (  MUCINEX DM MAXIMUM STRENGTH) 60-1200 MG TB12 Take 1 tablet by mouth in the morning and at bedtime.    [provider]  doxycycline (VIBRAMYCIN) 100 MG capsule Take 1 capsule (100 mg total) by mouth 2 (two) times daily. 04/30/19   Renne Crigler, PA-C  levothyroxine (SYNTHROID, LEVOTHROID) 25 MCG tablet Take 25 mcg by mouth daily before breakfast.     [provider]  memantine (NAMENDA) 10 MG tablet Take 10 mg by mouth 2 (two) times daily.    [provider]  Multiple Vitamins-Minerals (ONE-A-DAY PROACTIVE 65+) TABS Take 1 tablet by mouth daily with breakfast.    [provider]  nitroGLYCERIN  (NITROSTAT) 0.4 MG SL tablet Place 0.4 mg under the tongue every 5 (five) minutes as needed for chest pain.     [provider]  omeprazole (PRILOSEC) 20 MG capsule TAKE 1 CAPSULE BY MOUTH 30 TO 60 MINUTES BEFORE THE FIRST AND LAST MEALS OF THE DAY Patient taking differently: Take 20 mg by mouth 2 (two) times daily before a meal.     Kalman Shan, MD  OXYGEN Inhale 2 L/min into the lungs at bedtime.    [provider]  pravastatin (PRAVACHOL) 20 MG tablet Take 20 mg by mouth at bedtime. 04/17/19   [provider]  predniSONE (DELTASONE) 20 MG tablet Take 2 tablets (40 mg total) by mouth daily. 04/30/19   Renne Crigler, PA-C  QUEtiapine (SEROQUEL) 50 MG tablet Take 50 mg by mouth 2 (two) times daily.    [provider]  sertraline (ZOLOFT) 25 MG tablet Take 25 mg by mouth at bedtime. 04/20/19   [provider]  solifenacin (VESICARE) 5 MG tablet Take 5 mg by mouth at bedtime.     [provider]  traZODone (DESYREL) 50 MG tablet Take 50 mg by mouth at bedtime.    [provider]    Allergies    Patient has no known allergies.  Review of Systems   Review of Systems  Unable to perform ROS: Dementia    Physical Exam Updated Vital Signs BP 112/72   Pulse 70   Temp 97.7 F (36.5 C) (Oral)   Resp 16   Ht 4\' 11"  (1.499 m)   Wt 50 kg   SpO2 96%   BMI 22.26 kg/m   Physical Exam Vitals and nursing note reviewed.  Constitutional:      General: She is not in acute distress.    Appearance: Normal appearance. She is well-developed and normal weight. She is not ill-appearing.     Comments: Oriented to person and place only  HENT:     Head: Normocephalic and atraumatic.     Mouth/Throat:     Pharynx: No oropharyngeal exudate.  Eyes:     Conjunctiva/sclera: Conjunctivae normal.     Pupils: Pupils are equal, round, and reactive to light.  Neck:     Comments: No meningismus. Cardiovascular:     Rate and Rhythm: Normal rate  and regular rhythm.     Heart sounds: Normal heart sounds. No murmur.  Pulmonary:     Effort: Pulmonary effort is normal. No respiratory distress.     Breath sounds: Rhonchi present.  Abdominal:     Palpations: Abdomen is soft.     Tenderness: There is no abdominal tenderness. There is no guarding or rebound.  Musculoskeletal:        General: Signs of injury present. No tenderness. Normal range of motion.     Cervical back:  Normal range of motion and neck supple.     Comments: U-shaped skin tear to left dorsal hand No bony tenderness  Full range of motion of hips without pain  Skin:    General: Skin is warm.  Neurological:     Mental Status: She is alert.     Cranial Nerves: No cranial nerve deficit.     Motor: No abnormal muscle tone.     Coordination: Coordination normal.     Comments: Moves all extremities, does not follow commands  Psychiatric:        Behavior: Behavior normal.     ED Results / Procedures / Treatments   Labs (all labs ordered are listed, but only abnormal results are displayed) Labs Reviewed - No data to display  EKG EKG Interpretation  Date/Time:  Wednesday May 07 2019 23:58:20 EDT Ventricular Rate:  65 PR Interval:    QRS Duration: 118 QT Interval:  455 QTC Calculation: 474 R Axis:   -34 Text Interpretation: Sinus rhythm Ventricular trigeminy Incomplete RBBB and LAFB No significant change was found Confirmed by Ezequiel Essex [phone]) on 05/08/2019 12:04:48 AM   Radiology DG Chest 2 View  Result Date: 05/07/2019 CLINICAL DATA:  86 year old female with fall. EXAM: CHEST - 2 VIEW COMPARISON:  Chest radiograph dated 04/30/2019. FINDINGS: Diffuse bilateral pulmonary nodules similar to prior radiograph. No new consolidative changes. There is no pleural effusion pneumothorax. Stable cardiac silhouette. Atherosclerotic calcification of the aorta. No acute osseous pathology. IMPRESSION: No significant interval change in the diffuse bilateral pulmonary  nodules. No new consolidation. Electronically Signed   By: Anner Crete M.D.   On: 05/07/2019 23:51   DG Pelvis 1-2 Views  Result Date: 05/07/2019 CLINICAL DATA:  86 year old female status post fall tonight. EXAM: PELVIS - 1-2 VIEW COMPARISON:  None. FINDINGS: AP view at 2342 hours. Femoral heads are normally located. Hip joint spaces appear normal for age. Grossly intact proximal femurs. No pelvis fracture identified. Bone mineralization is within normal limits for age. SI joints appear normal. Partially visible advanced lower lumbar disc space loss. Incidental calcified flank soft tissue granulomas. Visible bowel-gas pattern within normal limits. IMPRESSION: No acute fracture or dislocation identified about the pelvis. Electronically Signed   By: Genevie Ann M.D.   On: 05/07/2019 23:50   CT Head Wo Contrast  Result Date: 05/07/2019 CLINICAL DATA:  86 year old female status post fall from home hospital bed. EXAM: CT HEAD WITHOUT CONTRAST TECHNIQUE: Contiguous axial images were obtained from the base of the skull through the vertex without intravenous contrast. COMPARISON:  None. FINDINGS: Brain: Cerebral volume is within normal limits for age. No intracranial mass effect. No ventriculomegaly. Small dystrophic calcification in the left parieto-occipital sulcus (sagittal image 31) is nonspecific but probably postinflammatory. Mild vascular related dystrophic calcifications in the basal ganglia. No intracranial mass lesion. No acute intracranial hemorrhage identified. No cortically based acute infarct identified. Mild for age scattered white matter hypodensity. No cortical encephalomalacia identified. Vascular: Calcified atherosclerosis at the skull base. No suspicious intracranial vascular hyperdensity. Skull: No fracture identified. Sinuses/Orbits: Visualized paranasal sinuses and mastoids are well pneumatized. Other: No scalp or orbits soft tissue injury identified. IMPRESSION: 1. No acute intracranial  abnormality or acute traumatic injury identified. 2. Mild for age white matter changes, most commonly due to chronic small vessel disease. Electronically Signed   By: Genevie Ann M.D.   On: 05/07/2019 23:41   CT Cervical Spine Wo Contrast  Result Date: 05/07/2019 CLINICAL DATA:  86 year old female status post fall  from home hospital bed. EXAM: CT CERVICAL SPINE WITHOUT CONTRAST TECHNIQUE: Multidetector CT imaging of the cervical spine was performed without intravenous contrast. Multiplanar CT image reconstructions were also generated. COMPARISON:  Head CT today reported separately. Chest CTA 04/30/2019. FINDINGS: Alignment: Relatively preserved cervical lordosis. There is subtle degenerative appearing anterolisthesis at C7-T1 associated with facet hypertrophy. Bilateral posterior element alignment is within normal limits. Skull base and vertebrae: Visualized skull base is intact. No atlanto-occipital dissociation. C1 and C2 appear intact and normally aligned. No acute osseous abnormality identified. Soft tissues and spinal canal: No prevertebral fluid or swelling. No visible canal hematoma. Calcified carotid atherosclerosis but otherwise negative noncontrast neck soft tissues. Disc levels: Overall mild for age cervical spine degeneration. Mild if any associated cervical spinal stenosis. Upper chest: Visible upper thoracic levels appear grossly intact. There is continued widespread abnormal upper lung nodularity, tree in bud appearance (series 2, image 75). Appearance is stable since 04/30/2019. Other: Negative visible posterior fossa. IMPRESSION: 1.  No acute traumatic injury identified in the cervical spine. 2. Extensive pulmonary nodularity in the visible lungs appears stable from the recent 04/30/2019 Chest CTA. Favor either postinflammatory nodules or less likely on going atypical infection. Electronically Signed   By: Odessa Fleming M.D.   On: 05/07/2019 23:47   DG Hand Complete Left  Result Date:  05/07/2019 CLINICAL DATA:  86 year old female with fall. EXAM: LEFT HAND - COMPLETE 3+ VIEW COMPARISON:  None. FINDINGS: There is no acute fracture or dislocation. The bones are osteopenic. There is osteoarthritic changes of the base of the thumb as well as erosive osteoarthritis of the interphalangeal joints. Mild chronic subluxation of the fifth PIP joint. The soft tissues are unremarkable. IMPRESSION: 1. No acute fracture or dislocation. 2. Osteopenia with osteoarthritis. Electronically Signed   By: Elgie Collard M.D.   On: 05/07/2019 23:53    Procedures Procedures (including critical care time)  Medications Ordered in ED Medications  Tdap (BOOSTRIX) injection 0.5 mL (has no administration in time range)    ED Course  I have reviewed the triage vital signs and the nursing notes.  Pertinent labs & imaging results that were available during my care of the patient were reviewed by me and considered in my medical decision making (see chart for details).    MDM Rules/Calculators/A&P                     Dementia patient with fall from bed sustaining skin tear and possible head injury.  Baseline level of mentation per family at bedside.  CT head and C-spine are negative. Chest x-ray shows normal or pulmonary nodules with are stable.  Recent chest CT on March 31 showed no pulmonary embolism. X-ray left hand shows chronic subluxation PIP joint but no other acute findings. Patient able to flex and extend joint on exam without difficulty  Patient is at her baseline mental status per family in the room.  Her skin tear is cleaned and Steri-Strips applied.  Tetanus updated. Patient is nonambulatory at baseline.  Wound care discussed with her and her daughter. Return precautions discussed.  Final Clinical Impression(s) / ED Diagnoses Final diagnoses:  Fall, initial encounter  Skin tear of left hand without complication, initial encounter    Rx / DC Orders ED Discharge Orders    None        Jazmin Vensel, Jeannett Senior, MD 05/08/19 [phone]

## 2019-05-08 NOTE — Discharge Instructions (Signed)
Your x-rays and CT scans are reassuring.  Keep the wound to the hand clean and dry.  The Steri-Strips should fall off on their own.  Follow-up with your doctor for recheck of the wound this week.  Return to the ED with new or worsening symptoms.

## 2019-06-03 NOTE — Telephone Encounter (Signed)
This encounter was created in error - please disregard.

## 2019-11-26 ENCOUNTER — Observation Stay (HOSPITAL_COMMUNITY): Payer: Medicare Other

## 2019-11-26 ENCOUNTER — Emergency Department (HOSPITAL_COMMUNITY): Payer: Medicare Other

## 2019-11-26 ENCOUNTER — Observation Stay (HOSPITAL_BASED_OUTPATIENT_CLINIC_OR_DEPARTMENT_OTHER)
Admission: EM | Admit: 2019-11-26 | Discharge: 2019-11-27 | Disposition: A | Discharge status: Home / Self Care | Payer: Medicare Other | Source: Home / Self Care | Attending: Emergency Medicine | Admitting: Emergency Medicine

## 2019-11-26 ENCOUNTER — Encounter (HOSPITAL_COMMUNITY): Payer: Self-pay | Admitting: Emergency Medicine

## 2019-11-26 DIAGNOSIS — J45909 Unspecified asthma, uncomplicated: Secondary | ICD-10-CM | POA: Insufficient documentation

## 2019-11-26 DIAGNOSIS — Z20822 Contact with and (suspected) exposure to covid-19: Secondary | ICD-10-CM | POA: Insufficient documentation

## 2019-11-26 DIAGNOSIS — Z79899 Other long term (current) drug therapy: Secondary | ICD-10-CM | POA: Insufficient documentation

## 2019-11-26 DIAGNOSIS — F32A Depression, unspecified: Secondary | ICD-10-CM | POA: Diagnosis present

## 2019-11-26 DIAGNOSIS — E7849 Other hyperlipidemia: Secondary | ICD-10-CM

## 2019-11-26 DIAGNOSIS — J9611 Chronic respiratory failure with hypoxia: Secondary | ICD-10-CM

## 2019-11-26 DIAGNOSIS — R4182 Altered mental status, unspecified: Secondary | ICD-10-CM | POA: Insufficient documentation

## 2019-11-26 DIAGNOSIS — S72141A Displaced intertrochanteric fracture of right femur, initial encounter for closed fracture: Secondary | ICD-10-CM | POA: Diagnosis not present

## 2019-11-26 DIAGNOSIS — J9610 Chronic respiratory failure, unspecified whether with hypoxia or hypercapnia: Secondary | ICD-10-CM | POA: Diagnosis present

## 2019-11-26 DIAGNOSIS — G309 Alzheimer's disease, unspecified: Secondary | ICD-10-CM | POA: Insufficient documentation

## 2019-11-26 DIAGNOSIS — I739 Peripheral vascular disease, unspecified: Secondary | ICD-10-CM | POA: Diagnosis not present

## 2019-11-26 DIAGNOSIS — Z9981 Dependence on supplemental oxygen: Secondary | ICD-10-CM | POA: Insufficient documentation

## 2019-11-26 DIAGNOSIS — Z7982 Long term (current) use of aspirin: Secondary | ICD-10-CM | POA: Insufficient documentation

## 2019-11-26 DIAGNOSIS — R410 Disorientation, unspecified: Secondary | ICD-10-CM

## 2019-11-26 DIAGNOSIS — I700 Atherosclerosis of aorta: Secondary | ICD-10-CM

## 2019-11-26 DIAGNOSIS — I10 Essential (primary) hypertension: Secondary | ICD-10-CM | POA: Insufficient documentation

## 2019-11-26 DIAGNOSIS — F0390 Unspecified dementia without behavioral disturbance: Secondary | ICD-10-CM | POA: Diagnosis present

## 2019-11-26 DIAGNOSIS — E039 Hypothyroidism, unspecified: Secondary | ICD-10-CM | POA: Insufficient documentation

## 2019-11-26 LAB — URINALYSIS, MICROSCOPIC (REFLEX)

## 2019-11-26 LAB — COMPREHENSIVE METABOLIC PANEL
ALT: 24 U/L (ref 0–44)
AST: 32 U/L (ref 15–41)
Albumin: 3.5 g/dL (ref 3.5–5.0)
Alkaline Phosphatase: 78 U/L (ref 38–126)
Anion gap: 12 (ref 5–15)
BUN: 20 mg/dL (ref 8–23)
CO2: 23 mmol/L (ref 22–32)
Calcium: 8.6 mg/dL — ABNORMAL LOW (ref 8.9–10.3)
Chloride: 104 mmol/L (ref 98–111)
Creatinine, Ser: 0.82 mg/dL (ref 0.44–1.00)
GFR, Estimated: >60 mL/min (ref >60)
Glucose, Bld: 95 mg/dL (ref 70–99)
Potassium: 4 mmol/L (ref 3.5–5.1)
Sodium: 139 mmol/L (ref 135–145)
Total Bilirubin: 1 mg/dL (ref 0.3–1.2)
Total Protein: 6.6 g/dL (ref 6.5–8.1)

## 2019-11-26 LAB — CBC
HCT: 47.6 % — ABNORMAL HIGH (ref 36.0–46.0)
Hemoglobin: 15.9 g/dL — ABNORMAL HIGH (ref 12.0–15.0)
MCH: 30.6 pg (ref 26.0–34.0)
MCHC: 33.4 g/dL (ref 30.0–36.0)
MCV: 91.5 fL (ref 80.0–100.0)
Platelets: 242 10*3/uL (ref 150–400)
RBC: 5.2 MIL/uL — ABNORMAL HIGH (ref 3.87–5.11)
RDW: 13.2 % (ref 11.5–15.5)
WBC: 9.7 10*3/uL (ref 4.0–10.5)
nRBC: 0 % (ref 0.0–0.2)

## 2019-11-26 LAB — URINALYSIS, ROUTINE W REFLEX MICROSCOPIC
Bilirubin Urine: NEGATIVE
Glucose, UA: NEGATIVE mg/dL
Ketones, ur: NEGATIVE mg/dL
Nitrite: NEGATIVE
Protein, ur: NEGATIVE mg/dL
Specific Gravity, Urine: >1.03 — ABNORMAL HIGH (ref 1.005–1.030)
pH: 5.5 (ref 5.0–8.0)

## 2019-11-26 LAB — CBC WITH DIFFERENTIAL/PLATELET
Abs Immature Granulocytes: 0.03 10*3/uL (ref 0.00–0.07)
Basophils Absolute: 0 10*3/uL (ref 0.0–0.1)
Basophils Relative: 0 %
Eosinophils Absolute: 0 10*3/uL (ref 0.0–0.5)
Eosinophils Relative: 0 %
HCT: 47.9 % — ABNORMAL HIGH (ref 36.0–46.0)
Hemoglobin: 15.7 g/dL — ABNORMAL HIGH (ref 12.0–15.0)
Immature Granulocytes: 0 %
Lymphocytes Relative: 16 %
Lymphs Abs: 1.7 10*3/uL (ref 0.7–4.0)
MCH: 30.3 pg (ref 26.0–34.0)
MCHC: 32.8 g/dL (ref 30.0–36.0)
MCV: 92.5 fL (ref 80.0–100.0)
Monocytes Absolute: 0.6 10*3/uL (ref 0.1–1.0)
Monocytes Relative: 6 %
Neutro Abs: 8.1 10*3/uL — ABNORMAL HIGH (ref 1.7–7.7)
Neutrophils Relative %: 78 %
Platelets: 225 10*3/uL (ref 150–400)
RBC: 5.18 MIL/uL — ABNORMAL HIGH (ref 3.87–5.11)
RDW: 13.2 % (ref 11.5–15.5)
WBC: 10.5 10*3/uL (ref 4.0–10.5)
nRBC: 0 % (ref 0.0–0.2)

## 2019-11-26 LAB — TROPONIN I (HIGH SENSITIVITY)
Troponin I (High Sensitivity): 336 ng/L (ref <18)
Troponin I (High Sensitivity): 270 ng/L (ref <18)

## 2019-11-26 LAB — RESPIRATORY PANEL BY RT PCR (FLU A&B, COVID)
Influenza A by PCR: NEGATIVE
Influenza B by PCR: NEGATIVE
SARS Coronavirus 2 by RT PCR: NEGATIVE

## 2019-11-26 LAB — CBG MONITORING, ED: Glucose-Capillary: 94 mg/dL (ref 70–99)

## 2019-11-26 LAB — D-DIMER, QUANTITATIVE: D-Dimer, Quant: 1.03 ug/mL-FEU — ABNORMAL HIGH (ref 0.00–0.50)

## 2019-11-26 LAB — CREATININE, SERUM
Creatinine, Ser: 0.84 mg/dL (ref 0.44–1.00)
GFR, Estimated: >60 mL/min (ref >60)

## 2019-11-26 MED ORDER — QUETIAPINE FUMARATE 50 MG PO TABS
50.0000 mg | ORAL_TABLET | Freq: Two times a day (BID) | ORAL | Status: DC
  Administered 2019-11-27 (×2): 50 mg via ORAL
  Filled 2019-11-26 (×3): qty 1

## 2019-11-26 MED ORDER — MEMANTINE HCL 10 MG PO TABS
10.0000 mg | ORAL_TABLET | Freq: Two times a day (BID) | ORAL | Status: DC
  Administered 2019-11-27: 10 mg via ORAL
  Filled 2019-11-26 (×3): qty 1

## 2019-11-26 MED ORDER — SODIUM CHLORIDE 0.9 % IV SOLN: INTRAVENOUS | Status: DC

## 2019-11-26 MED ORDER — ALBUTEROL SULFATE (2.5 MG/3ML) 0.083% IN NEBU: 2.5000 mg | INHALATION_SOLUTION | Freq: Three times a day (TID) | RESPIRATORY_TRACT | Status: DC | PRN

## 2019-11-26 MED ORDER — ACETAMINOPHEN 650 MG RE SUPP: 650.0000 mg | RECTAL | Status: DC | PRN

## 2019-11-26 MED ORDER — ACETAMINOPHEN 325 MG PO TABS: 650.0000 mg | ORAL_TABLET | ORAL | Status: DC | PRN

## 2019-11-26 MED ORDER — ASPIRIN EC 81 MG PO TBEC
81.0000 mg | DELAYED_RELEASE_TABLET | Freq: Every day | ORAL | Status: DC
  Administered 2019-11-27: 81 mg via ORAL
  Filled 2019-11-26: qty 1

## 2019-11-26 MED ORDER — SODIUM CHLORIDE 0.9 % IV SOLN: INTRAVENOUS | Status: AC

## 2019-11-26 MED ORDER — STROKE: EARLY STAGES OF RECOVERY BOOK
Freq: Once | Status: DC
  Filled 2019-11-26: qty 1

## 2019-11-26 MED ORDER — IOHEXOL 300 MG/ML  SOLN
100.0000 mL | Freq: Once | INTRAMUSCULAR | Status: AC | PRN
  Administered 2019-11-26: 100 mL via INTRAVENOUS

## 2019-11-26 MED ORDER — LORAZEPAM 2 MG/ML IJ SOLN
0.5000 mg | Freq: Once | INTRAMUSCULAR | Status: DC
  Filled 2019-11-26: qty 1

## 2019-11-26 MED ORDER — NITROGLYCERIN 0.4 MG SL SUBL: 0.4000 mg | SUBLINGUAL_TABLET | SUBLINGUAL | Status: DC | PRN

## 2019-11-26 MED ORDER — PANTOPRAZOLE SODIUM 40 MG IV SOLR
40.0000 mg | INTRAVENOUS | Status: DC
  Administered 2019-11-26: 40 mg via INTRAVENOUS
  Filled 2019-11-26 (×2): qty 40

## 2019-11-26 MED ORDER — ONDANSETRON HCL 4 MG/2ML IJ SOLN
4.0000 mg | Freq: Three times a day (TID) | INTRAMUSCULAR | Status: DC | PRN
  Administered 2019-11-26: 4 mg via INTRAVENOUS
  Filled 2019-11-26: qty 2

## 2019-11-26 MED ORDER — LEVOTHYROXINE SODIUM 25 MCG PO TABS
25.0000 ug | ORAL_TABLET | Freq: Every day | ORAL | Status: DC
  Administered 2019-11-27: 25 ug via ORAL
  Filled 2019-11-26: qty 1

## 2019-11-26 MED ORDER — SERTRALINE HCL 50 MG PO TABS
25.0000 mg | ORAL_TABLET | Freq: Every day | ORAL | Status: DC
  Administered 2019-11-27: 25 mg via ORAL
  Filled 2019-11-26: qty 1

## 2019-11-26 MED ORDER — PRAVASTATIN SODIUM 10 MG PO TABS
20.0000 mg | ORAL_TABLET | Freq: Every day | ORAL | Status: DC
  Administered 2019-11-27: 20 mg via ORAL
  Filled 2019-11-26: qty 2

## 2019-11-26 MED ORDER — HALOPERIDOL LACTATE 5 MG/ML IJ SOLN
5.0000 mg | Freq: Three times a day (TID) | INTRAMUSCULAR | Status: DC | PRN
  Administered 2019-11-26: 5 mg via INTRAVENOUS
  Filled 2019-11-26: qty 1

## 2019-11-26 MED ORDER — HEPARIN SODIUM (PORCINE) 5000 UNIT/ML IJ SOLN
5000.0000 [IU] | Freq: Three times a day (TID) | INTRAMUSCULAR | Status: DC
  Administered 2019-11-27 (×2): 5000 [IU] via SUBCUTANEOUS
  Filled 2019-11-26 (×2): qty 1

## 2019-11-26 MED ORDER — ACETAMINOPHEN 160 MG/5ML PO SOLN: 650.0000 mg | ORAL | Status: DC | PRN

## 2019-11-26 MED ORDER — PANTOPRAZOLE SODIUM 40 MG IV SOLR: 40.0000 mg | INTRAVENOUS | Status: DC

## 2019-11-26 MED ORDER — BENZONATATE 100 MG PO CAPS: 100.0000 mg | ORAL_CAPSULE | Freq: Three times a day (TID) | ORAL | Status: DC

## 2019-11-26 NOTE — ED Notes (Signed)
Patient transported to MRI 

## 2019-11-26 NOTE — ED Notes (Signed)
Son states pt has not been responsive today, went to get mother up at 10:30, seemed disoriented, assisted into shower, but remained confused.  Pt normally walks, and talks, appears that her upper lip is slightly swollen, does move all extremeties equally.

## 2019-11-26 NOTE — ED Notes (Signed)
Son at bedside.

## 2019-11-26 NOTE — ED Triage Notes (Signed)
To ED via [facility] from home- lives with son, ONLY SPEAKS SPANISH-- son is on the way.  Son thinks that she may have a UTI- hx of same--  Nonverbal, does not follow commands due to confusion.  EMS VS 132/70 -P=80, R= 22.  22g IV LAC per EMS

## 2019-11-26 NOTE — ED Provider Notes (Signed)
  Provider Note MRN:  [id number]  Arrival date & time: 11/26/19    ED Course and Medical Decision Making  Assumed care from Dr. Effie Shy at shift change.  Delirium with largely negative work-up, admitted to hospital service for further care.  Procedures  Final Clinical Impressions(s) / ED Diagnoses     ICD-10-CM   1. Delirium  R41.0   2. AMS (altered mental status)  R41.82 DG Chest [facility] 1 View    DG Chest Port 1 View    ED Discharge Orders    None      Discharge Instructions   None     Elmer Sow. Pilar Plate, MD [facility] Emergency Medicine [facility] [email or web address]    Sabas Sous, MD 11/26/19 [phone]

## 2019-11-26 NOTE — ED Notes (Signed)
Admitting Provider made aware of elevated Troponin. This RN obtained a new EKG for assessment and ensured this pt was on the telemetry monitor at this time. No other orders at this time.

## 2019-11-26 NOTE — ED Notes (Signed)
Patient transported to CT 

## 2019-11-26 NOTE — ED Notes (Signed)
Pt transported to CT for CTA

## 2019-11-26 NOTE — ED Provider Notes (Signed)
[facility] Provider Note   CSN: [id number] Arrival date & time: 11/26/19  1237     History Chief Complaint  Patient presents with  . Altered Mental Status    Mackenzie Peterson is a 86 y.o. female.  HPI Patient is here with her son, presenting by EMS for evaluation of abnormal behavior with concern of family members, that she has a UTI.  Patient lives with the son's wife, he is usually "out of the country but is currently visiting."  Son thinks the patient is less responsive and able to communicate today.  He is able to speak to her in her native language, [place].  Son is present to give history.  Apparently the patient is amatory at home, and sometimes gets up at night to go to the bathroom without assistance.  No specifically known trauma.  Otherwise normal behavior, and activities at home.  Son does not know of any problems such as fever, cough or vomiting.  There are no other known modifying factors.    Past Medical History:  Diagnosis Date  . Alzheimer disease (HCC)   . Asthma   . Chronic bronchitis (HCC)   . Dysthymic disorder   . Emphysema   . Hyperlipidemia   . Hypertension   . hypothyroid   . PVD (peripheral vascular disease) [facility](HCC)     Patient Active Problem List   Diagnosis Date Noted  . 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 Dementia (HCC) 01/06/2012  . Pneumonia 01/06/2012  . Leukocytosis 01/05/2012  . Abdominal pain 01/05/2012  . HYPOTHYROIDISM 10/26/2009  . DEPRESSION 10/26/2009  . Mild persistent chronic asthma without complication 10/26/2009  . SHORTNESS OF BREATH (SOB) 10/26/2009  . COUGH 10/26/2009    History reviewed. No pertinent surgical history.   OB History   No obstetric history on file.     No family history on file.  Social History   Tobacco Use  . Smoking status:  Never Smoker  . Smokeless tobacco: Never Used  Substance Use Topics  . Alcohol use: No  . Drug use: No    Home Medications Prior to Admission medications   Medication Sig Start Date End Date Taking? Authorizing Provider  acetaminophen (TYLENOL) 500 MG tablet Take 500-1,000 mg by mouth every 6 (six) hours as needed for mild pain or headache.    [provider]  albuterol (PROVENTIL) (2.5 MG/3ML) 0.083% nebulizer solution Take 2.5 mg by nebulization 3 (three) times daily as needed for wheezing or shortness of breath.     [provider]  ALPRAZolam Prudy Feeler(XANAX) 0.5 MG tablet Take 0.5 mg by mouth See admin instructions. Take 0.5 mg by mouth two to four times a day    [provider]  aspirin 81 MG tablet Take 81 mg by mouth daily.    [provider]  benzonatate (TESSALON) 100 MG capsule Take 1 capsule (100 mg total) by mouth every 8 (eight) hours. 04/30/19   Renne CriglerGeiple, Joshua, PA-C  cetirizine (ZYRTEC) 10 MG tablet Take 10 mg by mouth at bedtime.    [provider]  Dextromethorphan-guaiFENesin (MUCINEX DM MAXIMUM STRENGTH) 60-1200 MG TB12 Take 1 tablet by mouth in the morning and at bedtime.    [provider]  doxycycline (VIBRAMYCIN) 100 MG capsule Take 1 capsule (100 mg total) by mouth 2 (two) times daily. 04/30/19   Renne CriglerGeiple, Joshua,  PA-C  levothyroxine (SYNTHROID, LEVOTHROID) 25 MCG tablet Take 25 mcg by mouth daily before breakfast.     [provider]  memantine (NAMENDA) 10 MG tablet Take 10 mg by mouth 2 (two) times daily.    [provider]  Multiple Vitamins-Minerals (ONE-A-DAY PROACTIVE 65+) TABS Take 1 tablet by mouth daily with breakfast.    [provider]  nitroGLYCERIN (NITROSTAT) 0.4 MG SL tablet Place 0.4 mg under the tongue every 5 (five) minutes as needed for chest pain.     [provider]  omeprazole (PRILOSEC) 20 MG capsule TAKE 1 CAPSULE BY MOUTH 30 TO 60 MINUTES BEFORE THE FIRST AND LAST MEALS  OF THE DAY Patient taking differently: Take 20 mg by mouth 2 (two) times daily before a meal.     Kalman Shan, MD  OXYGEN Inhale 2 L/min into the lungs at bedtime.    [provider]  pravastatin (PRAVACHOL) 20 MG tablet Take 20 mg by mouth at bedtime. 04/17/19   [provider]  predniSONE (DELTASONE) 20 MG tablet Take 2 tablets (40 mg total) by mouth daily. 04/30/19   Renne Crigler, PA-C  QUEtiapine (SEROQUEL) 50 MG tablet Take 50 mg by mouth 2 (two) times daily.    [provider]  sertraline (ZOLOFT) 25 MG tablet Take 25 mg by mouth at bedtime. 04/20/19   [provider]  solifenacin (VESICARE) 5 MG tablet Take 5 mg by mouth at bedtime.     [provider]  traZODone (DESYREL) 50 MG tablet Take 50 mg by mouth at bedtime.    [provider]    Allergies    Patient has no known allergies.  Review of Systems   Review of Systems  All other systems reviewed and are negative.   Physical Exam Updated Vital Signs BP (!) 126/98   Pulse 63   Temp 99.6 F (37.6 C) (Rectal)   Resp 14   SpO2 98%   Physical Exam Vitals and nursing note reviewed.  Constitutional:      General: She is not in acute distress.    Appearance: She is well-developed. She is not ill-appearing, toxic-appearing or diaphoretic.  HENT:     Head: Normocephalic.     Comments: Swelling, free maxillary area of the mid face without associated crepitation or deformity.  No visible dental injury or intraoral injury.  No visible injury to the cranium or scalp.    Right Ear: External ear normal.     Left Ear: External ear normal.     Mouth/Throat:     Mouth: Mucous membranes are moist.     Pharynx: No oropharyngeal exudate.  Eyes:     Conjunctiva/sclera: Conjunctivae normal.     Pupils: Pupils are equal, round, and reactive to light.  Neck:     Trachea: Phonation normal.  Cardiovascular:     Rate and Rhythm: Normal rate and regular rhythm.     Heart sounds:  Normal heart sounds.  Pulmonary:     Effort: Pulmonary effort is normal.     Breath sounds: Normal breath sounds.  Abdominal:     Palpations: Abdomen is soft.     Tenderness: There is no abdominal tenderness.  Musculoskeletal:        General: No swelling, tenderness or signs of injury. Normal range of motion.     Cervical back: Normal range of motion and neck supple.  Skin:    General: Skin is warm and dry.  Neurological:  Mental Status: She is alert.     Cranial Nerves: No cranial nerve deficit.     Motor: No abnormal muscle tone.     Coordination: Coordination normal.     Comments: Alert and responsive, speaking Spanish to her son.  She is cooperative when I ask her to move, for examination.  Psychiatric:        Behavior: Behavior normal.     ED Results / Procedures / Treatments   Labs (all labs ordered are listed, but only abnormal results are displayed) Labs Reviewed  URINALYSIS, ROUTINE W REFLEX MICROSCOPIC - Abnormal; Notable for the following components:      Result Value   Specific Gravity, Urine >1.030 (*)    Hgb urine dipstick SMALL (*)    Leukocytes,Ua TRACE (*)    All other components within normal limits  CBC WITH DIFFERENTIAL/PLATELET - Abnormal; Notable for the following components:   RBC 5.18 (*)    Hemoglobin 15.7 (*)    HCT 47.9 (*)    Neutro Abs 8.1 (*)    All other components within normal limits  URINALYSIS, MICROSCOPIC (REFLEX) - Abnormal; Notable for the following components:   Bacteria, UA RARE (*)    All other components within normal limits  URINE CULTURE  COMPREHENSIVE METABOLIC PANEL    EKG EKG Interpretation  Date/Time:  Wednesday November 26 2019 12:44:34 EDT Ventricular Rate:  64 PR Interval:    QRS Duration: 119 QT Interval:  449 QTC Calculation: 464 R Axis:   -56 Text Interpretation: Sinus rhythm Incomplete RBBB and LAFB LVH with secondary repolarization abnormality since last tracing no significant change Confirmed by Mancel Bale [phone]) on 11/26/2019 1:01:04 PM   Radiology CT Head Wo Contrast  Result Date: 11/26/2019 CLINICAL DATA:  Poly trauma, critical, head/cervical spine injury suspected. EXAM: CT HEAD WITHOUT CONTRAST CT MAXILLOFACIAL WITHOUT CONTRAST CT CERVICAL SPINE WITHOUT CONTRAST TECHNIQUE: Multidetector CT imaging of the head, cervical spine, and maxillofacial structures were performed using the standard protocol without intravenous contrast. Multiplanar CT image reconstructions of the cervical spine and maxillofacial structures were also generated. COMPARISON:  Head CT 05/07/2019.  CT cervical spine 05/07/2019 FINDINGS: CT HEAD FINDINGS Brain: Cerebral volume is normal for age. Unchanged subcentimeter focus of calcification within the left parietooccipital lobes (series 6, image 25). There is no acute intracranial hemorrhage. No demarcated cortical infarct. No extra-axial fluid collection. No evidence of intracranial mass. No midline shift. Vascular: No hyperdense vessel.  Atherosclerotic calcifications. Skull: Normal. Negative for fracture or focal lesion. Other: No significant mastoid effusion. CT MAXILLOFACIAL FINDINGS The examination is mildly motion degraded. Osseous: No acute maxillofacial fracture is identified. Orbits: No acute finding. The globes are normal in size and contour. The extraocular muscles and optic nerve sheath complexes are symmetric and unremarkable. Sinuses: No significant paranasal sinus disease. Soft tissues: Unremarkable. No appreciable soft tissue swelling by CT. CT CERVICAL SPINE FINDINGS Alignment: Cervical levocurvature. Trace C3-C4, C4-C5 and C7-T1 grade 1 anterolisthesis. Skull base and vertebrae: The basion-dental and atlanto-dental intervals are maintained.No evidence of acute fracture to the cervical spine. Soft tissues and spinal canal: No prevertebral fluid or swelling. No visible canal hematoma. Disc levels: Cervical spondylosis. Most notably at C5-C6, there is advanced  disc space narrowing with a posterior disc osteophyte complex and uncovertebral hypertrophy. No high-grade bony spinal canal stenosis. Upper chest: Similar to the prior examination 05/07/2019, there is extensive nodularity with associated tree-in-bud opacities within the imaged lung apices. No visible pneumothorax. IMPRESSION: CT head: No evidence of  acute intracranial abnormality. CT maxillofacial: 1. Mildly motion degraded examination. 2. No evidence of acute maxillofacial fracture. CT cervical spine: 1. No evidence of acute fracture to the cervical spine. 2. A cervical levocurvature may be positional. 3. Mild C3-C4, C4-C5 and C7-T1 grade 1 anterolisthesis. 4. Cervical spondylosis as described and greatest at C5-C6. 5. Similar to the prior cervical spine CT of 05/07/2019, there is extensive nodularity and tree-in-bud opacities in the imaged lung apices. These findings are nonspecific but are favored to reflect postinflammatory nodules and/or chronic atypical infection. Electronically Signed   By: Jackey Loge DO   On: 11/26/2019 14:47   CT Cervical Spine Wo Contrast  Result Date: 11/26/2019 CLINICAL DATA:  Poly trauma, critical, head/cervical spine injury suspected. EXAM: CT HEAD WITHOUT CONTRAST CT MAXILLOFACIAL WITHOUT CONTRAST CT CERVICAL SPINE WITHOUT CONTRAST TECHNIQUE: Multidetector CT imaging of the head, cervical spine, and maxillofacial structures were performed using the standard protocol without intravenous contrast. Multiplanar CT image reconstructions of the cervical spine and maxillofacial structures were also generated. COMPARISON:  Head CT 05/07/2019.  CT cervical spine 05/07/2019 FINDINGS: CT HEAD FINDINGS Brain: Cerebral volume is normal for age. Unchanged subcentimeter focus of calcification within the left parietooccipital lobes (series 6, image 25). There is no acute intracranial hemorrhage. No demarcated cortical infarct. No extra-axial fluid collection. No evidence of intracranial mass.  No midline shift. Vascular: No hyperdense vessel.  Atherosclerotic calcifications. Skull: Normal. Negative for fracture or focal lesion. Other: No significant mastoid effusion. CT MAXILLOFACIAL FINDINGS The examination is mildly motion degraded. Osseous: No acute maxillofacial fracture is identified. Orbits: No acute finding. The globes are normal in size and contour. The extraocular muscles and optic nerve sheath complexes are symmetric and unremarkable. Sinuses: No significant paranasal sinus disease. Soft tissues: Unremarkable. No appreciable soft tissue swelling by CT. CT CERVICAL SPINE FINDINGS Alignment: Cervical levocurvature. Trace C3-C4, C4-C5 and C7-T1 grade 1 anterolisthesis. Skull base and vertebrae: The basion-dental and atlanto-dental intervals are maintained.No evidence of acute fracture to the cervical spine. Soft tissues and spinal canal: No prevertebral fluid or swelling. No visible canal hematoma. Disc levels: Cervical spondylosis. Most notably at C5-C6, there is advanced disc space narrowing with a posterior disc osteophyte complex and uncovertebral hypertrophy. No high-grade bony spinal canal stenosis. Upper chest: Similar to the prior examination 05/07/2019, there is extensive nodularity with associated tree-in-bud opacities within the imaged lung apices. No visible pneumothorax. IMPRESSION: CT head: No evidence of acute intracranial abnormality. CT maxillofacial: 1. Mildly motion degraded examination. 2. No evidence of acute maxillofacial fracture. CT cervical spine: 1. No evidence of acute fracture to the cervical spine. 2. A cervical levocurvature may be positional. 3. Mild C3-C4, C4-C5 and C7-T1 grade 1 anterolisthesis. 4. Cervical spondylosis as described and greatest at C5-C6. 5. Similar to the prior cervical spine CT of 05/07/2019, there is extensive nodularity and tree-in-bud opacities in the imaged lung apices. These findings are nonspecific but are favored to reflect postinflammatory  nodules and/or chronic atypical infection. Electronically Signed   By: Jackey Loge DO   On: 11/26/2019 14:47   DG Chest Port 1 View  Result Date: 11/26/2019 CLINICAL DATA:  Altered mental status. EXAM: PORTABLE CHEST 1 VIEW COMPARISON:  May 07, 2019 FINDINGS: No substantial change in innumerable pulmonary nodules bilaterally. No focal consolidation. No visible pleural effusion or pneumothorax. Similar eventration of the left medial hemidiaphragm. Cardiomediastinal silhouette is within normal limits. Aortic atherosclerosis. No acute osseous abnormality. IMPRESSION: No significant interval change in diffuse bilateral pulmonary nodules. Electronically Signed  By: Feliberto Harts MD   On: 11/26/2019 13:46   CT Maxillofacial WO CM  Result Date: 11/26/2019 CLINICAL DATA:  Poly trauma, critical, head/cervical spine injury suspected. EXAM: CT HEAD WITHOUT CONTRAST CT MAXILLOFACIAL WITHOUT CONTRAST CT CERVICAL SPINE WITHOUT CONTRAST TECHNIQUE: Multidetector CT imaging of the head, cervical spine, and maxillofacial structures were performed using the standard protocol without intravenous contrast. Multiplanar CT image reconstructions of the cervical spine and maxillofacial structures were also generated. COMPARISON:  Head CT 05/07/2019.  CT cervical spine 05/07/2019 FINDINGS: CT HEAD FINDINGS Brain: Cerebral volume is normal for age. Unchanged subcentimeter focus of calcification within the left parietooccipital lobes (series 6, image 25). There is no acute intracranial hemorrhage. No demarcated cortical infarct. No extra-axial fluid collection. No evidence of intracranial mass. No midline shift. Vascular: No hyperdense vessel.  Atherosclerotic calcifications. Skull: Normal. Negative for fracture or focal lesion. Other: No significant mastoid effusion. CT MAXILLOFACIAL FINDINGS The examination is mildly motion degraded. Osseous: No acute maxillofacial fracture is identified. Orbits: No acute finding. The globes  are normal in size and contour. The extraocular muscles and optic nerve sheath complexes are symmetric and unremarkable. Sinuses: No significant paranasal sinus disease. Soft tissues: Unremarkable. No appreciable soft tissue swelling by CT. CT CERVICAL SPINE FINDINGS Alignment: Cervical levocurvature. Trace C3-C4, C4-C5 and C7-T1 grade 1 anterolisthesis. Skull base and vertebrae: The basion-dental and atlanto-dental intervals are maintained.No evidence of acute fracture to the cervical spine. Soft tissues and spinal canal: No prevertebral fluid or swelling. No visible canal hematoma. Disc levels: Cervical spondylosis. Most notably at C5-C6, there is advanced disc space narrowing with a posterior disc osteophyte complex and uncovertebral hypertrophy. No high-grade bony spinal canal stenosis. Upper chest: Similar to the prior examination 05/07/2019, there is extensive nodularity with associated tree-in-bud opacities within the imaged lung apices. No visible pneumothorax. IMPRESSION: CT head: No evidence of acute intracranial abnormality. CT maxillofacial: 1. Mildly motion degraded examination. 2. No evidence of acute maxillofacial fracture. CT cervical spine: 1. No evidence of acute fracture to the cervical spine. 2. A cervical levocurvature may be positional. 3. Mild C3-C4, C4-C5 and C7-T1 grade 1 anterolisthesis. 4. Cervical spondylosis as described and greatest at C5-C6. 5. Similar to the prior cervical spine CT of 05/07/2019, there is extensive nodularity and tree-in-bud opacities in the imaged lung apices. These findings are nonspecific but are favored to reflect postinflammatory nodules and/or chronic atypical infection. Electronically Signed   By: Jackey Loge DO   On: 11/26/2019 14:47    Procedures Procedures (including critical care time)  Medications Ordered in ED Medications  LORazepam (ATIVAN) injection 0.5 mg (0.5 mg Intravenous Not Given 11/26/19 1423)    ED Course  I have reviewed the triage  vital signs and the nursing notes.  Pertinent labs & imaging results that were available during my care of the patient were reviewed by me and considered in my medical decision making (see chart for details).  Clinical Course as of Nov 26 1638  Wed Nov 26, 2019  1636 Normal  CBC with Differential(!) [EW]  1636 normal  Urinalysis, Microscopic (reflex)(!) [EW]  1637 Normal except elevated specific gravity  Urinalysis, Routine w reflex microscopic Urine, Catheterized(!) [EW]  1638 CT images of head, cervical spine and maxillofacial, interpreted by radiology as no acute abnormalities.  Degenerative changes of the cervical spine are present as well as chronic changes at the apices of the lungs, similar to April 2021.   [EW]    Clinical Course User Index [EW] Effie Shy,  Mechele Collin, MD   MDM Rules/Calculators/A&P                           Patient Vitals for the past 24 hrs:  BP Temp Temp src Pulse Resp SpO2  11/26/19 1530 (!) 126/98 -- -- 63 14 98 %  11/26/19 1330 (!) 123/50 -- -- 63 19 100 %  11/26/19 1254 -- -- -- -- -- 97 %  11/26/19 1247 (!) 132/56 -- -- -- -- --  11/26/19 1240 (!) 123/45 99.6 F (37.6 C) Rectal 62 14 98 %    4:39 PM Reevaluation with update and discussion. After initial assessment and treatment, an updated evaluation reveals no change in status, findings discussed with patient's son.  He states that he has been mother is "just not like herself."  He reports that he returned to the [place] after 4 months abroad, for work.  Yesterday she was her usual self which he describes as mild confusion, asking questions repetitively, and requiring assistance with ADLs.  He feels like she has much different today, has "a blank look on her face," and he is concerned that something is wrong with her.  I explained that as I now it was not clear what was wrong.  We discussed further evaluation possibilities and hospitalization for delirium evaluation.  He is agreeable to that and wants  to stay with her, to help communicate with her. Mancel Bale   Medical Decision Making:  This patient is presenting for evaluation of altered mental status, which does require a range of treatment options, and is a complaint that involves a moderate risk of morbidity and mortality. The differential diagnoses include acute illness, metabolic disorder, traumatic injury from fall. I decided to review old records, and in summary elderly female lives with family members, and presents with abnormal behavior, according to them.  Incidental note made of possible midface injury, without known fall.  I obtained additional historical information from son at the bedside.  Clinical Laboratory Tests Ordered, included CBC and Urinalysis. Review indicates normal findings. Radiologic Tests Ordered, included CT head, face and cervical spine.  I independently Visualized: Radiographic images, which show no acute abnormality    Critical Interventions-clinical evaluation, radiography, laboratory testing, observation reassessment  After These Interventions, the Patient was reevaluated and was found with no abnormalities on initial testing.  Patient with likely chronic dementia, worsening from the perspective of her son who has spent the last 4 months outside of the country.  He notices a difference since yesterday, raising possibility of acute abnormality.  I suspect that she has significant dementia.  CRITICAL CARE-no Performed by: Mancel Bale  Nursing Notes Reviewed/ Care Coordinated Applicable Imaging Reviewed Interpretation of Laboratory Data incorporated into ED treatment     Final Clinical Impression(s) / ED Diagnoses Final diagnoses:  AMS (altered mental status)    Rx / DC Orders ED Discharge Orders    None       Mancel Bale, MD 11/29/19 [phone]

## 2019-11-26 NOTE — Consult Note (Signed)
Cardiology Consult Note:   Patient ID: Mackenzie Peterson MRN: [id number]; DOB: 11-15-1924   Admission date: 11/26/2019  Primary Care Provider: Florentina Jennyripp, Henry, MD [facility] Cardiologist: No primary care provider on file.  Consulted for the evaluation of troponin elevation at the behest of Dr. Irena CordsEkta Patel  Patient Profile:   Mackenzie Peterson is a 86 y.o. female with Alzheimer's Dementia, HLD, Aortic Atherosclerosis & coronary calcifications (04/30/19 CTPE), peripheral arterial disease, and HTN who presents   History of Present Illness:   Mackenzie Peterson presented initially for AMS last known normal 11/25/19.  Per chart review, patient slept in, and when woke has limited interaction and confusion.  History from son.  Patient's baseline is able to converse with family but needs some help with ADLs.  Am on 11/26/19 patient was sleeping late and would only response "nada" to every question.  Listless, complained of being cold.  Intermittently throughout day, would repeat the same there prayers over and over again.  Was briefly lucid 5:15 PM and patient's sone was able to briefly converse with patient.  While getting imaging for AMS eval,  patient had an event where she began foaming at the mouth, with rigid tonic and clonic movements.  Patient's mouth turned blue.  Hs-troponin found to be 336 as part of evaluation.  Past Medical History:  Diagnosis Date  . Alzheimer disease (HCC)   . Asthma   . Chronic bronchitis (HCC)   . Dysthymic disorder   . Emphysema   . Hyperlipidemia   . Hypertension   . hypothyroid   . PVD (peripheral vascular disease) (HCC)    History reviewed. No pertinent surgical history.   Medications Prior to Admission: Prior to Admission medications   Medication Sig Start Date End Date Taking? Authorizing Provider  acetaminophen (TYLENOL) 500 MG tablet Take 500-1,000 mg by mouth every 6 (six) hours as needed for mild pain or headache.    [provider]  albuterol  (PROVENTIL) (2.5 MG/3ML) 0.083% nebulizer solution Take 2.5 mg by nebulization 3 (three) times daily as needed for wheezing or shortness of breath.     [provider]  ALPRAZolam Prudy Feeler(XANAX) 0.5 MG tablet Take 0.5 mg by mouth See admin instructions. Take 0.5 mg by mouth two to four times a day    [provider]  aspirin 81 MG tablet Take 81 mg by mouth daily.    [provider]  benzonatate (TESSALON) 100 MG capsule Take 1 capsule (100 mg total) by mouth every 8 (eight) hours. 04/30/19   Renne CriglerGeiple, Joshua, PA-C  cetirizine (ZYRTEC) 10 MG tablet Take 10 mg by mouth at bedtime.    [provider]  Dextromethorphan-guaiFENesin (MUCINEX DM MAXIMUM STRENGTH) 60-1200 MG TB12 Take 1 tablet by mouth in the morning and at bedtime.    [provider]  doxycycline (VIBRAMYCIN) 100 MG capsule Take 1 capsule (100 mg total) by mouth 2 (two) times daily. 04/30/19   Renne CriglerGeiple, Joshua, PA-C  levothyroxine (SYNTHROID, LEVOTHROID) 25 MCG tablet Take 25 mcg by mouth daily before breakfast.     [provider]  memantine (NAMENDA) 10 MG tablet Take 10 mg by mouth 2 (two) times daily.    [provider]  Multiple Vitamins-Minerals (ONE-A-DAY PROACTIVE 65+) TABS Take 1 tablet by mouth daily with breakfast.    [provider]  nitroGLYCERIN (NITROSTAT) 0.4 MG SL tablet Place 0.4 mg under the tongue every 5 (five) minutes as needed for chest pain.     [provider]  omeprazole (PRILOSEC) 20 MG capsule TAKE 1 CAPSULE BY MOUTH 30 TO 60 MINUTES BEFORE THE FIRST AND LAST MEALS OF THE DAY Patient taking differently: Take 20 mg by mouth 2 (two) times daily before a meal.     Kalman Shan, MD  OXYGEN Inhale 2 L/min into the lungs at bedtime.    [provider]  pravastatin (PRAVACHOL) 20 MG tablet Take 20 mg by mouth at bedtime. 04/17/19   [provider]  predniSONE (DELTASONE) 20 MG tablet Take 2 tablets (40 mg total) by mouth daily.  04/30/19   Renne Crigler, PA-C  QUEtiapine (SEROQUEL) 50 MG tablet Take 50 mg by mouth 2 (two) times daily.    [provider]  sertraline (ZOLOFT) 25 MG tablet Take 25 mg by mouth at bedtime. 04/20/19   [provider]  solifenacin (VESICARE) 5 MG tablet Take 5 mg by mouth at bedtime.     [provider]  traZODone (DESYREL) 50 MG tablet Take 50 mg by mouth at bedtime.    [provider]    Allergies:   No Known Allergies  Social History:   Social History   Socioeconomic History  . Marital status: Widowed    Spouse name: Not on file  . Number of children: Not on file  . Years of education: Not on file  . Highest education level: Not on file  Occupational History  . Not on file  Tobacco Use  . Smoking status: Never Smoker  . Smokeless tobacco: Never Used  Substance and Sexual Activity  . Alcohol use: No  . Drug use: No  . Sexual activity: Not on file  Other Topics Concern  . Not on file  Social History Narrative  . Not on file   Social Determinants of Health   Financial Resource Strain:   . Difficulty of Paying Living Expenses: Not on file  Food Insecurity:   . Worried About Programme researcher, broadcasting/film/video in the Last Year: Not on file  . Ran Out of Food in the Last Year: Not on file  Transportation Needs:   . Lack of Transportation (Medical): Not on file  . Lack of Transportation (Non-Medical): Not on file  Physical Activity:   . Days of Exercise per Week: Not on file  . Minutes of Exercise per Session: Not on file  Stress:   . Feeling of Stress : Not on file  Social Connections:   . Frequency of Communication with Friends and Family: Not on file  . Frequency of Social Gatherings with Friends and Family: Not on file  . Attends Religious Services: Not on file  . Active Member of Clubs or Organizations: Not on file  . Attends Banker Meetings: Not on file  . Marital Status: Not on file  Intimate Partner Violence:   . Fear of  Current or Ex-Partner: Not on file  . Emotionally Abused: Not on file  . Physically Abused: Not on file  . Sexually Abused: Not on file    Family History:   The patient's family history notable for no history of heart disease per son.  ROS:  AMS- patient unable to provide  Physical Exam/Data:   Vitals:   11/26/19 1330 11/26/19 1530 11/26/19 1630 11/26/19 1715  BP: (!) 123/50 (!) 126/98 (!) 123/48 114/73  Pulse: 63 63 63 65  Resp: 19 14 15 20   Temp:      TempSrc:      SpO2: 100% 98% 99% 97%  No intake or output data in the 24 hours ending 11/26/19 1930 Last 3 Weights 05/07/2019 04/30/2019 03/02/2018  Weight (lbs) 110 lb 3.7 oz 110 lb 3.7 oz 110 lb  Weight (kg) 50 kg 50 kg 49.896 kg     There is no height or weight on file to calculate BMI.  General:  Elderly female, frail HEENT: normal Neck: no JVD Cardiac:  normal S1, S2; RRR; no murmur Lungs:  clear to auscultation bilaterally, no wheezing, rhonchi or rales  Abd: soft, nontender, no hepatomegaly  Ext: no edema  Musculoskeletal:  No deformities, BUE and BLE strength normal and equal Skin: warm and dry  Neuro:  GCS 8 non asterixis Psych:  Listless, AOXO  EKG:  The ECG that was done  was personally reviewed and demonstrates: 11/26/1242- SR RBBB and LAFB without q waves 11/26/19 PM EKG:  SR 82 RBBB and LAFB (Bifasciular block) stable from AM  Relevant CV Studies: None  Laboratory Data:  High Sensitivity Troponin:   Recent Labs  Lab 11/26/19 1829  TROPONINIHS 336*      Chemistry Recent Labs  Lab 11/26/19 1544 11/26/19 1829  NA 139  --   K 4.0  --   CL 104  --   CO2 23  --   GLUCOSE 95  --   BUN 20  --   CREATININE 0.82 0.84  CALCIUM 8.6*  --   GFRNONAA >60 >60  ANIONGAP 12  --     Recent Labs  Lab 11/26/19 1544  PROT 6.6  ALBUMIN 3.5  AST 32  ALT 24  ALKPHOS 78  BILITOT 1.0   Hematology Recent Labs  Lab 11/26/19 1544 11/26/19 1829  WBC 10.5 9.7  RBC 5.18* 5.20*  HGB 15.7* 15.9*  HCT  47.9* 47.6*  MCV 92.5 91.5  MCH 30.3 30.6  MCHC 32.8 33.4  RDW 13.2 13.2  PLT 225 242   DDimer  Recent Labs  Lab 11/26/19 1829  DDIMER 1.03*   Radiology/Studies:  CT Head Wo Contrast  Result Date: 11/26/2019 CLINICAL DATA:  Poly trauma, critical, head/cervical spine injury suspected. EXAM: CT HEAD WITHOUT CONTRAST CT MAXILLOFACIAL WITHOUT CONTRAST CT CERVICAL SPINE WITHOUT CONTRAST TECHNIQUE: Multidetector CT imaging of the head, cervical spine, and maxillofacial structures were performed using the standard protocol without intravenous contrast. Multiplanar CT image reconstructions of the cervical spine and maxillofacial structures were also generated. COMPARISON:  Head CT 05/07/2019.  CT cervical spine 05/07/2019 FINDINGS: CT HEAD FINDINGS Brain: Cerebral volume is normal for age. Unchanged subcentimeter focus of calcification within the left parietooccipital lobes (series 6, image 25). There is no acute intracranial hemorrhage. No demarcated cortical infarct. No extra-axial fluid collection. No evidence of intracranial mass. No midline shift. Vascular: No hyperdense vessel.  Atherosclerotic calcifications. Skull: Normal. Negative for fracture or focal lesion. Other: No significant mastoid effusion. CT MAXILLOFACIAL FINDINGS The examination is mildly motion degraded. Osseous: No acute maxillofacial fracture is identified. Orbits: No acute finding. The globes are normal in size and contour. The extraocular muscles and optic nerve sheath complexes are symmetric and unremarkable. Sinuses: No significant paranasal sinus disease. Soft tissues: Unremarkable. No appreciable soft tissue swelling by CT. CT CERVICAL SPINE FINDINGS Alignment: Cervical levocurvature. Trace C3-C4, C4-C5 and C7-T1 grade 1 anterolisthesis. Skull base and vertebrae: The basion-dental and atlanto-dental intervals are maintained.No evidence of acute fracture to the cervical spine. Soft tissues and spinal canal: No prevertebral fluid  or swelling. No visible canal hematoma. Disc levels: Cervical spondylosis. Most notably at C5-C6,  there is advanced disc space narrowing with a posterior disc osteophyte complex and uncovertebral hypertrophy. No high-grade bony spinal canal stenosis. Upper chest: Similar to the prior examination 05/07/2019, there is extensive nodularity with associated tree-in-bud opacities within the imaged lung apices. No visible pneumothorax. IMPRESSION: CT head: No evidence of acute intracranial abnormality. CT maxillofacial: 1. Mildly motion degraded examination. 2. No evidence of acute maxillofacial fracture. CT cervical spine: 1. No evidence of acute fracture to the cervical spine. 2. A cervical levocurvature may be positional. 3. Mild C3-C4, C4-C5 and C7-T1 grade 1 anterolisthesis. 4. Cervical spondylosis as described and greatest at C5-C6. 5. Similar to the prior cervical spine CT of 05/07/2019, there is extensive nodularity and tree-in-bud opacities in the imaged lung apices. These findings are nonspecific but are favored to reflect postinflammatory nodules and/or chronic atypical infection. Electronically Signed   By: Jackey Loge DO   On: 11/26/2019 14:47   CT Cervical Spine Wo Contrast  Result Date: 11/26/2019 CLINICAL DATA:  Poly trauma, critical, head/cervical spine injury suspected. EXAM: CT HEAD WITHOUT CONTRAST CT MAXILLOFACIAL WITHOUT CONTRAST CT CERVICAL SPINE WITHOUT CONTRAST TECHNIQUE: Multidetector CT imaging of the head, cervical spine, and maxillofacial structures were performed using the standard protocol without intravenous contrast. Multiplanar CT image reconstructions of the cervical spine and maxillofacial structures were also generated. COMPARISON:  Head CT 05/07/2019.  CT cervical spine 05/07/2019 FINDINGS: CT HEAD FINDINGS Brain: Cerebral volume is normal for age. Unchanged subcentimeter focus of calcification within the left parietooccipital lobes (series 6, image 25). There is no acute  intracranial hemorrhage. No demarcated cortical infarct. No extra-axial fluid collection. No evidence of intracranial mass. No midline shift. Vascular: No hyperdense vessel.  Atherosclerotic calcifications. Skull: Normal. Negative for fracture or focal lesion. Other: No significant mastoid effusion. CT MAXILLOFACIAL FINDINGS The examination is mildly motion degraded. Osseous: No acute maxillofacial fracture is identified. Orbits: No acute finding. The globes are normal in size and contour. The extraocular muscles and optic nerve sheath complexes are symmetric and unremarkable. Sinuses: No significant paranasal sinus disease. Soft tissues: Unremarkable. No appreciable soft tissue swelling by CT. CT CERVICAL SPINE FINDINGS Alignment: Cervical levocurvature. Trace C3-C4, C4-C5 and C7-T1 grade 1 anterolisthesis. Skull base and vertebrae: The basion-dental and atlanto-dental intervals are maintained.No evidence of acute fracture to the cervical spine. Soft tissues and spinal canal: No prevertebral fluid or swelling. No visible canal hematoma. Disc levels: Cervical spondylosis. Most notably at C5-C6, there is advanced disc space narrowing with a posterior disc osteophyte complex and uncovertebral hypertrophy. No high-grade bony spinal canal stenosis. Upper chest: Similar to the prior examination 05/07/2019, there is extensive nodularity with associated tree-in-bud opacities within the imaged lung apices. No visible pneumothorax. IMPRESSION: CT head: No evidence of acute intracranial abnormality. CT maxillofacial: 1. Mildly motion degraded examination. 2. No evidence of acute maxillofacial fracture. CT cervical spine: 1. No evidence of acute fracture to the cervical spine. 2. A cervical levocurvature may be positional. 3. Mild C3-C4, C4-C5 and C7-T1 grade 1 anterolisthesis. 4. Cervical spondylosis as described and greatest at C5-C6. 5. Similar to the prior cervical spine CT of 05/07/2019, there is extensive nodularity and  tree-in-bud opacities in the imaged lung apices. These findings are nonspecific but are favored to reflect postinflammatory nodules and/or chronic atypical infection. Electronically Signed   By: Jackey Loge DO   On: 11/26/2019 14:47   DG Chest Port 1 View  Result Date: 11/26/2019 CLINICAL DATA:  Altered mental status. EXAM: PORTABLE CHEST 1 VIEW COMPARISON:  May 07, 2019 FINDINGS: No substantial change in innumerable pulmonary nodules bilaterally. No focal consolidation. No visible pleural effusion or pneumothorax. Similar eventration of the left medial hemidiaphragm. Cardiomediastinal silhouette is within normal limits. Aortic atherosclerosis. No acute osseous abnormality. IMPRESSION: No significant interval change in diffuse bilateral pulmonary nodules. Electronically Signed   By: Feliberto Harts MD   On: 11/26/2019 13:46   CT Maxillofacial WO CM  Result Date: 11/26/2019 CLINICAL DATA:  Poly trauma, critical, head/cervical spine injury suspected. EXAM: CT HEAD WITHOUT CONTRAST CT MAXILLOFACIAL WITHOUT CONTRAST CT CERVICAL SPINE WITHOUT CONTRAST TECHNIQUE: Multidetector CT imaging of the head, cervical spine, and maxillofacial structures were performed using the standard protocol without intravenous contrast. Multiplanar CT image reconstructions of the cervical spine and maxillofacial structures were also generated. COMPARISON:  Head CT 05/07/2019.  CT cervical spine 05/07/2019 FINDINGS: CT HEAD FINDINGS Brain: Cerebral volume is normal for age. Unchanged subcentimeter focus of calcification within the left parietooccipital lobes (series 6, image 25). There is no acute intracranial hemorrhage. No demarcated cortical infarct. No extra-axial fluid collection. No evidence of intracranial mass. No midline shift. Vascular: No hyperdense vessel.  Atherosclerotic calcifications. Skull: Normal. Negative for fracture or focal lesion. Other: No significant mastoid effusion. CT MAXILLOFACIAL FINDINGS The  examination is mildly motion degraded. Osseous: No acute maxillofacial fracture is identified. Orbits: No acute finding. The globes are normal in size and contour. The extraocular muscles and optic nerve sheath complexes are symmetric and unremarkable. Sinuses: No significant paranasal sinus disease. Soft tissues: Unremarkable. No appreciable soft tissue swelling by CT. CT CERVICAL SPINE FINDINGS Alignment: Cervical levocurvature. Trace C3-C4, C4-C5 and C7-T1 grade 1 anterolisthesis. Skull base and vertebrae: The basion-dental and atlanto-dental intervals are maintained.No evidence of acute fracture to the cervical spine. Soft tissues and spinal canal: No prevertebral fluid or swelling. No visible canal hematoma. Disc levels: Cervical spondylosis. Most notably at C5-C6, there is advanced disc space narrowing with a posterior disc osteophyte complex and uncovertebral hypertrophy. No high-grade bony spinal canal stenosis. Upper chest: Similar to the prior examination 05/07/2019, there is extensive nodularity with associated tree-in-bud opacities within the imaged lung apices. No visible pneumothorax. IMPRESSION: CT head: No evidence of acute intracranial abnormality. CT maxillofacial: 1. Mildly motion degraded examination. 2. No evidence of acute maxillofacial fracture. CT cervical spine: 1. No evidence of acute fracture to the cervical spine. 2. A cervical levocurvature may be positional. 3. Mild C3-C4, C4-C5 and C7-T1 grade 1 anterolisthesis. 4. Cervical spondylosis as described and greatest at C5-C6. 5. Similar to the prior cervical spine CT of 05/07/2019, there is extensive nodularity and tree-in-bud opacities in the imaged lung apices. These findings are nonspecific but are favored to reflect postinflammatory nodules and/or chronic atypical infection. Electronically Signed   By: Jackey Loge DO   On: 11/26/2019 14:47     Assessment and Plan:   Troponin elevation in setting of PAD, Aortic Atherosclerosis,  HTN, and HLD Presenting with AMS on top of Alzheimer's dementia - DDX includes stroke, true ACS event, - would treat conservatively given above - continue ASA and pravastatin - trend troponin and get echo; will follow results of CTPE - if significant troponin elevation on subsequent without evidence of bleeding diathesis,or if evidence for PE; would start heparin  Discussed with son (patient still altered)  For questions or updates, please contact [facility] Please consult [email or web address] for contact info under     Signed, Christell Constant, MD  11/26/2019 7:30 PM

## 2019-11-26 NOTE — ED Notes (Signed)
Son came out stating he thinks pt had a seizure, went in and pt is altered (like before) but in NAD, MD made ware of situation, will continue to monitor

## 2019-11-26 NOTE — ED Notes (Signed)
This RN placed an order for IV Team as this pt requires a 20G in the [facility] or greater for her CT Scan. This RN attempted several times but was only able to get an IV in her distal forearm. CT Scan made aware.

## 2019-11-26 NOTE — ED Notes (Signed)
Pt was not willing to complete Stroke Swallow Screen at this time; this RN will maintain this pt as NPO for the time until pt is less agitated to be able to complete screening. RN will continue to monitor and place SLP order at this time for further evaluation.

## 2019-11-26 NOTE — H&P (Signed)
History and Physical    Mackenzie Peterson TIR:[id number] DOB: [birth year] DOA: 11/26/2019  PCP: [name], MD           Unassigned.  Patient coming from: Home   Chief Complaint:  AMS   HPI: Mackenzie Peterson is a 86 y.o. female with medical history significant of alzheimer's, htn. Hyperlipidemia,hypothyroid, PVD seen in ed for AMS.Son Mackenzie Peterson [phone] at bedside giving history. He is giving history, pt has been confused today only and started today and has been confused all day today. Today am she slept in and at about 10:40 son went to wake up and she appeared blank and confused. Pt kept saying nothing"nada" to all questions , then he called his wife and called ems. Ordinarily pt is alert and awake and does her ADL at home. Pt is repeating same phrases.nada or senor. And about 20 minutes she was alert and lucid and answered appropriately and it was brief and went back to her confused state. Son came back from [place] two days ago.  ED Course:  Blood pressure 114/73, pulse 65, temperature 99.6 F (37.6 C), temperature source Rectal, resp. rate 20, SpO2 97 %. SpO2: 97 % Initial head ct negative.  Review of Systems: As per HPI otherwise all systems reviewed and negative.  Past Medical History:  Diagnosis Date  . Alzheimer disease (HCC)   . Asthma   . Chronic bronchitis (HCC)   . Dysthymic disorder   . Emphysema   . Hyperlipidemia   . Hypertension   . hypothyroid   . PVD (peripheral vascular disease) (HCC)     History reviewed. No pertinent surgical history.   reports that she has never smoked. She has never used smokeless tobacco. She reports that she does not drink alcohol and does not use drugs.  No Known Allergies  No family history on file.  Prior to Admission medications   Medication Sig Start Date End Date Taking? Authorizing Provider  acetaminophen (TYLENOL) 500 MG tablet Take 500-1,000 mg by mouth every 6 (six) hours as needed for mild pain or headache.     [provider]  albuterol (PROVENTIL) (2.5 MG/3ML) 0.083% nebulizer solution Take 2.5 mg by nebulization 3 (three) times daily as needed for wheezing or shortness of breath.     [provider]  ALPRAZolam Prudy Feeler) 0.5 MG tablet Take 0.5 mg by mouth See admin instructions. Take 0.5 mg by mouth two to four times a day    [provider]  aspirin 81 MG tablet Take 81 mg by mouth daily.    [provider]  benzonatate (TESSALON) 100 MG capsule Take 1 capsule (100 mg total) by mouth every 8 (eight) hours. 04/30/19   Renne Crigler, PA-C  cetirizine (ZYRTEC) 10 MG tablet Take 10 mg by mouth at bedtime.    [provider]  Dextromethorphan-guaiFENesin (MUCINEX DM MAXIMUM STRENGTH) 60-1200 MG TB12 Take 1 tablet by mouth in the morning and at bedtime.    [provider]  doxycycline (VIBRAMYCIN) 100 MG capsule Take 1 capsule (100 mg total) by mouth 2 (two) times daily. 04/30/19   Renne Crigler, PA-C  levothyroxine (SYNTHROID, LEVOTHROID) 25 MCG tablet Take 25 mcg by mouth daily before breakfast.     [provider]  memantine (NAMENDA) 10 MG tablet Take 10 mg by mouth 2 (two) times daily.    [provider]  Multiple Vitamins-Minerals (ONE-A-DAY PROACTIVE 65+) TABS Take 1 tablet by mouth daily with breakfast.    [provider]  nitroGLYCERIN (NITROSTAT) 0.4 MG SL tablet Place 0.4 mg under the tongue every 5 (five) minutes as needed for chest pain.     [provider]  omeprazole (PRILOSEC) 20 MG capsule TAKE 1 CAPSULE BY MOUTH 30 TO 60 MINUTES BEFORE THE FIRST AND LAST MEALS OF THE DAY Patient taking differently: Take 20 mg by mouth 2 (two) times daily before a meal.     Kalman Shan, MD  OXYGEN Inhale 2 L/min into the lungs at bedtime.    [provider]  pravastatin (PRAVACHOL) 20 MG tablet Take 20 mg by mouth at bedtime. 04/17/19   [provider]  predniSONE (DELTASONE) 20 MG tablet Take 2  tablets (40 mg total) by mouth daily. 04/30/19   Renne Crigler, PA-C  QUEtiapine (SEROQUEL) 50 MG tablet Take 50 mg by mouth 2 (two) times daily.    [provider]  sertraline (ZOLOFT) 25 MG tablet Take 25 mg by mouth at bedtime. 04/20/19   [provider]  solifenacin (VESICARE) 5 MG tablet Take 5 mg by mouth at bedtime.     [provider]  traZODone (DESYREL) 50 MG tablet Take 50 mg by mouth at bedtime.    [provider]    Physical Exam: Vitals:   11/26/19 1330 11/26/19 1530 11/26/19 1630 11/26/19 1715  BP: (!) 123/50 (!) 126/98 (!) 123/48 114/73  Pulse: 63 63 63 65  Resp: 19 14 15 20   Temp:      TempSrc:      SpO2: 100% 98% 99% 97%    Constitutional: NAD, calm, comfortable Vitals:   11/26/19 1330 11/26/19 1530 11/26/19 1630 11/26/19 1715  BP: (!) 123/50 (!) 126/98 (!) 123/48 114/73  Pulse: 63 63 63 65  Resp: 19 14 15 20   Temp:      TempSrc:      SpO2: 100% 98% 99% 97%   Eyes: PERRL, EOMI lids and conjunctivae normal ENMT: Mucous membranes are moist. Posterior pharynx clear of any exudate or lesions.Normal dentition.  Neck: normal, supple, no masses, no thyromegaly, no carotid bruit  Respiratory: clear to auscultation bilaterally, no wheezing, no crackles. Normal respiratory effort. No accessory muscle use.  Cardiovascular: Regular rate and rhythm, no murmurs / rubs / gallops. No extremity edema. 2+ pedal pulses. No carotid bruits.  Abdomen: no tenderness, no masses palpated. No hepatosplenomegaly. Bowel sounds positive.  Musculoskeletal: no clubbing / cyanosis. No joint deformity upper and lower extremities. Pt moving all four ext , no contractures. Normal muscle tone.  Skin: no rashes, lesions, ulcers. No induration Neurologic: CN 2-12 grossly intact.  DTR 1+ in both biceps / patella, Strength 5/5 in all 4.  Psychiatric: Normal judgment and insight. Alert and oriented x 3. Normal mood.   Labs on Admission: I have personally reviewed  following labs and imaging studies  CBC: Recent Labs  Lab 11/26/19 1544  WBC 10.5  NEUTROABS 8.1*  HGB 15.7*  HCT 47.9*  MCV 92.5  PLT 225   Basic Metabolic Panel: Recent Labs  Lab 11/26/19 1544  NA 139  K 4.0  CL 104  CO2 23  GLUCOSE 95  BUN 20  CREATININE 0.82  CALCIUM 8.6*   GFR: CrCl cannot be calculated (Unknown ideal weight.). Liver Function Tests: Recent Labs  Lab 11/26/19 1544  AST 32  ALT 24  ALKPHOS 78  BILITOT 1.0  PROT 6.6  ALBUMIN 3.5   No results for input(s): LIPASE, AMYLASE in the last 168 hours. No results for input(s): AMMONIA  in the last 168 hours. Coagulation Profile: No results for input(s): INR, PROTIME in the last 168 hours. Cardiac Enzymes: No results for input(s): CKTOTAL, CKMB, CKMBINDEX, TROPONINI in the last 168 hours. BNP (last 3 results) No results for input(s): PROBNP in the last 8760 hours. HbA1C: No results for input(s): HGBA1C in the last 72 hours. CBG: No results for input(s): GLUCAP in the last 168 hours. Lipid Profile: No results for input(s): CHOL, HDL, LDLCALC, TRIG, CHOLHDL, LDLDIRECT in the last 72 hours. Thyroid Function Tests: No results for input(s): TSH, T4TOTAL, FREET4, T3FREE, THYROIDAB in the last 72 hours. Anemia Panel: No results for input(s): VITAMINB12, FOLATE, FERRITIN, TIBC, IRON, RETICCTPCT in the last 72 hours. Urine analysis:    Component Value Date/Time   COLORURINE YELLOW 11/26/2019 1322   APPEARANCEUR CLEAR 11/26/2019 1322   LABSPEC >1.030 (H) 11/26/2019 1322   PHURINE 5.5 11/26/2019 1322   GLUCOSEU NEGATIVE 11/26/2019 1322   HGBUR SMALL (A) 11/26/2019 1322   BILIRUBINUR NEGATIVE 11/26/2019 1322   KETONESUR NEGATIVE 11/26/2019 1322   PROTEINUR NEGATIVE 11/26/2019 1322   UROBILINOGEN 0.2 06/17/2012 1645   NITRITE NEGATIVE 11/26/2019 1322   LEUKOCYTESUR TRACE (A) 11/26/2019 1322   No intake or output data in the 24 hours ending 11/26/19 1819 Lab Results  Component Value Date    CREATININE 0.82 11/26/2019   CREATININE 0.83 04/30/2019   CREATININE 0.92 03/02/2018    COVID-19 Labs  No results for input(s): DDIMER, FERRITIN, LDH, CRP in the last 72 hours.  No results found for: SARSCOV2NAA  Radiological Exams on Admission: CT Head Wo Contrast  Result Date: 11/26/2019 CLINICAL DATA:  Poly trauma, critical, head/cervical spine injury suspected. EXAM: CT HEAD WITHOUT CONTRAST CT MAXILLOFACIAL WITHOUT CONTRAST CT CERVICAL SPINE WITHOUT CONTRAST TECHNIQUE: Multidetector CT imaging of the head, cervical spine, and maxillofacial structures were performed using the standard protocol without intravenous contrast. Multiplanar CT image reconstructions of the cervical spine and maxillofacial structures were also generated. COMPARISON:  Head CT 05/07/2019.  CT cervical spine 05/07/2019 FINDINGS: CT HEAD FINDINGS Brain: Cerebral volume is normal for age. Unchanged subcentimeter focus of calcification within the left parietooccipital lobes (series 6, image 25). There is no acute intracranial hemorrhage. No demarcated cortical infarct. No extra-axial fluid collection. No evidence of intracranial mass. No midline shift. Vascular: No hyperdense vessel.  Atherosclerotic calcifications. Skull: Normal. Negative for fracture or focal lesion. Other: No significant mastoid effusion. CT MAXILLOFACIAL FINDINGS The examination is mildly motion degraded. Osseous: No acute maxillofacial fracture is identified. Orbits: No acute finding. The globes are normal in size and contour. The extraocular muscles and optic nerve sheath complexes are symmetric and unremarkable. Sinuses: No significant paranasal sinus disease. Soft tissues: Unremarkable. No appreciable soft tissue swelling by CT. CT CERVICAL SPINE FINDINGS Alignment: Cervical levocurvature. Trace C3-C4, C4-C5 and C7-T1 grade 1 anterolisthesis. Skull base and vertebrae: The basion-dental and atlanto-dental intervals are maintained.No evidence of acute  fracture to the cervical spine. Soft tissues and spinal canal: No prevertebral fluid or swelling. No visible canal hematoma. Disc levels: Cervical spondylosis. Most notably at C5-C6, there is advanced disc space narrowing with a posterior disc osteophyte complex and uncovertebral hypertrophy. No high-grade bony spinal canal stenosis. Upper chest: Similar to the prior examination 05/07/2019, there is extensive nodularity with associated tree-in-bud opacities within the imaged lung apices. No visible pneumothorax. IMPRESSION: CT head: No evidence of acute intracranial abnormality. CT maxillofacial: 1. Mildly motion degraded examination. 2. No evidence of acute maxillofacial fracture. CT cervical spine: 1. No evidence  of acute fracture to the cervical spine. 2. A cervical levocurvature may be positional. 3. Mild C3-C4, C4-C5 and C7-T1 grade 1 anterolisthesis. 4. Cervical spondylosis as described and greatest at C5-C6. 5. Similar to the prior cervical spine CT of 05/07/2019, there is extensive nodularity and tree-in-bud opacities in the imaged lung apices. These findings are nonspecific but are favored to reflect postinflammatory nodules and/or chronic atypical infection. Electronically Signed   By: Jackey Loge DO   On: 11/26/2019 14:47   CT Cervical Spine Wo Contrast  Result Date: 11/26/2019 CLINICAL DATA:  Poly trauma, critical, head/cervical spine injury suspected. EXAM: CT HEAD WITHOUT CONTRAST CT MAXILLOFACIAL WITHOUT CONTRAST CT CERVICAL SPINE WITHOUT CONTRAST TECHNIQUE: Multidetector CT imaging of the head, cervical spine, and maxillofacial structures were performed using the standard protocol without intravenous contrast. Multiplanar CT image reconstructions of the cervical spine and maxillofacial structures were also generated. COMPARISON:  Head CT 05/07/2019.  CT cervical spine 05/07/2019 FINDINGS: CT HEAD FINDINGS Brain: Cerebral volume is normal for age. Unchanged subcentimeter focus of calcification  within the left parietooccipital lobes (series 6, image 25). There is no acute intracranial hemorrhage. No demarcated cortical infarct. No extra-axial fluid collection. No evidence of intracranial mass. No midline shift. Vascular: No hyperdense vessel.  Atherosclerotic calcifications. Skull: Normal. Negative for fracture or focal lesion. Other: No significant mastoid effusion. CT MAXILLOFACIAL FINDINGS The examination is mildly motion degraded. Osseous: No acute maxillofacial fracture is identified. Orbits: No acute finding. The globes are normal in size and contour. The extraocular muscles and optic nerve sheath complexes are symmetric and unremarkable. Sinuses: No significant paranasal sinus disease. Soft tissues: Unremarkable. No appreciable soft tissue swelling by CT. CT CERVICAL SPINE FINDINGS Alignment: Cervical levocurvature. Trace C3-C4, C4-C5 and C7-T1 grade 1 anterolisthesis. Skull base and vertebrae: The basion-dental and atlanto-dental intervals are maintained.No evidence of acute fracture to the cervical spine. Soft tissues and spinal canal: No prevertebral fluid or swelling. No visible canal hematoma. Disc levels: Cervical spondylosis. Most notably at C5-C6, there is advanced disc space narrowing with a posterior disc osteophyte complex and uncovertebral hypertrophy. No high-grade bony spinal canal stenosis. Upper chest: Similar to the prior examination 05/07/2019, there is extensive nodularity with associated tree-in-bud opacities within the imaged lung apices. No visible pneumothorax. IMPRESSION: CT head: No evidence of acute intracranial abnormality. CT maxillofacial: 1. Mildly motion degraded examination. 2. No evidence of acute maxillofacial fracture. CT cervical spine: 1. No evidence of acute fracture to the cervical spine. 2. A cervical levocurvature may be positional. 3. Mild C3-C4, C4-C5 and C7-T1 grade 1 anterolisthesis. 4. Cervical spondylosis as described and greatest at C5-C6. 5. Similar  to the prior cervical spine CT of 05/07/2019, there is extensive nodularity and tree-in-bud opacities in the imaged lung apices. These findings are nonspecific but are favored to reflect postinflammatory nodules and/or chronic atypical infection. Electronically Signed   By: Jackey Loge DO   On: 11/26/2019 14:47   DG Chest Port 1 View  Result Date: 11/26/2019 CLINICAL DATA:  Altered mental status. EXAM: PORTABLE CHEST 1 VIEW COMPARISON:  May 07, 2019 FINDINGS: No substantial change in innumerable pulmonary nodules bilaterally. No focal consolidation. No visible pleural effusion or pneumothorax. Similar eventration of the left medial hemidiaphragm. Cardiomediastinal silhouette is within normal limits. Aortic atherosclerosis. No acute osseous abnormality. IMPRESSION: No significant interval change in diffuse bilateral pulmonary nodules. Electronically Signed   By: Feliberto Harts MD   On: 11/26/2019 13:46   CT Maxillofacial WO CM  Result Date: 11/26/2019 CLINICAL  DATA:  Poly trauma, critical, head/cervical spine injury suspected. EXAM: CT HEAD WITHOUT CONTRAST CT MAXILLOFACIAL WITHOUT CONTRAST CT CERVICAL SPINE WITHOUT CONTRAST TECHNIQUE: Multidetector CT imaging of the head, cervical spine, and maxillofacial structures were performed using the standard protocol without intravenous contrast. Multiplanar CT image reconstructions of the cervical spine and maxillofacial structures were also generated. COMPARISON:  Head CT 05/07/2019.  CT cervical spine 05/07/2019 FINDINGS: CT HEAD FINDINGS Brain: Cerebral volume is normal for age. Unchanged subcentimeter focus of calcification within the left parietooccipital lobes (series 6, image 25). There is no acute intracranial hemorrhage. No demarcated cortical infarct. No extra-axial fluid collection. No evidence of intracranial mass. No midline shift. Vascular: No hyperdense vessel.  Atherosclerotic calcifications. Skull: Normal. Negative for fracture or focal  lesion. Other: No significant mastoid effusion. CT MAXILLOFACIAL FINDINGS The examination is mildly motion degraded. Osseous: No acute maxillofacial fracture is identified. Orbits: No acute finding. The globes are normal in size and contour. The extraocular muscles and optic nerve sheath complexes are symmetric and unremarkable. Sinuses: No significant paranasal sinus disease. Soft tissues: Unremarkable. No appreciable soft tissue swelling by CT. CT CERVICAL SPINE FINDINGS Alignment: Cervical levocurvature. Trace C3-C4, C4-C5 and C7-T1 grade 1 anterolisthesis. Skull base and vertebrae: The basion-dental and atlanto-dental intervals are maintained.No evidence of acute fracture to the cervical spine. Soft tissues and spinal canal: No prevertebral fluid or swelling. No visible canal hematoma. Disc levels: Cervical spondylosis. Most notably at C5-C6, there is advanced disc space narrowing with a posterior disc osteophyte complex and uncovertebral hypertrophy. No high-grade bony spinal canal stenosis. Upper chest: Similar to the prior examination 05/07/2019, there is extensive nodularity with associated tree-in-bud opacities within the imaged lung apices. No visible pneumothorax. IMPRESSION: CT head: No evidence of acute intracranial abnormality. CT maxillofacial: 1. Mildly motion degraded examination. 2. No evidence of acute maxillofacial fracture. CT cervical spine: 1. No evidence of acute fracture to the cervical spine. 2. A cervical levocurvature may be positional. 3. Mild C3-C4, C4-C5 and C7-T1 grade 1 anterolisthesis. 4. Cervical spondylosis as described and greatest at C5-C6. 5. Similar to the prior cervical spine CT of 05/07/2019, there is extensive nodularity and tree-in-bud opacities in the imaged lung apices. These findings are nonspecific but are favored to reflect postinflammatory nodules and/or chronic atypical infection. Electronically Signed   By: Jackey Loge DO   On: 11/26/2019 14:47    EKG:  Independently reviewed. Sinus rhythm 64.  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 We will admit to med tele, son wants full code. Urine cx, d-dimer , cardiac enzymes, covid test. Check electrolytes, monitor for any underlying arrhythmia.   Hypothyroidism; TFT, cont levothyroxine.  Depression: Continue sertraline.  Dementia: Cont seroquel.  H/o asthma with c/h respiratory failure and home o2 2L at bedtime.   DVT prophylaxis:  Heparin  Code Status:  Full  Family Communication:  Luis garcia: [phone].  Disposition Plan:  Home  Consults called:  None  Admission status: Observation.   Gertha Calkin MD [facility] Pager [phone] If 7PM-7AM, please contact night-coverage [email or web address] Password [facility] 11/26/2019, 6:19 PM

## 2019-11-27 ENCOUNTER — Observation Stay (HOSPITAL_COMMUNITY): Payer: Medicare Other

## 2019-11-27 ENCOUNTER — Observation Stay (HOSPITAL_BASED_OUTPATIENT_CLINIC_OR_DEPARTMENT_OTHER): Payer: Medicare Other

## 2019-11-27 DIAGNOSIS — F0391 Unspecified dementia with behavioral disturbance: Secondary | ICD-10-CM | POA: Diagnosis not present

## 2019-11-27 DIAGNOSIS — R4182 Altered mental status, unspecified: Secondary | ICD-10-CM

## 2019-11-27 DIAGNOSIS — E039 Hypothyroidism, unspecified: Secondary | ICD-10-CM | POA: Diagnosis not present

## 2019-11-27 DIAGNOSIS — R410 Disorientation, unspecified: Secondary | ICD-10-CM | POA: Diagnosis not present

## 2019-11-27 DIAGNOSIS — I6389 Other cerebral infarction: Secondary | ICD-10-CM | POA: Diagnosis not present

## 2019-11-27 DIAGNOSIS — J9611 Chronic respiratory failure with hypoxia: Secondary | ICD-10-CM | POA: Diagnosis not present

## 2019-11-27 DIAGNOSIS — R778 Other specified abnormalities of plasma proteins: Secondary | ICD-10-CM

## 2019-11-27 LAB — AMMONIA: Ammonia: 44 umol/L — ABNORMAL HIGH (ref 9–35)

## 2019-11-27 LAB — ECHOCARDIOGRAM COMPLETE
Area-P 1/2: 1.94 cm2
P 1/2 time: 515 msec
S' Lateral: 2.32 cm

## 2019-11-27 LAB — VITAMIN B12: Vitamin B-12: 792 pg/mL (ref 180–914)

## 2019-11-27 LAB — TROPONIN I (HIGH SENSITIVITY): Troponin I (High Sensitivity): 250 ng/L (ref <18)

## 2019-11-27 LAB — URINE CULTURE: Culture: NO GROWTH

## 2019-11-27 LAB — RPR: RPR Ser Ql: NONREACTIVE

## 2019-11-27 LAB — TSH: TSH: 2.119 u[IU]/mL (ref 0.350–4.500)

## 2019-11-27 MED ORDER — LORAZEPAM 2 MG/ML IJ SOLN: 0.5000 mg | Freq: Once | INTRAMUSCULAR | Status: DC

## 2019-11-27 NOTE — ED Notes (Signed)
EEG at bedside.

## 2019-11-27 NOTE — Progress Notes (Signed)
  Echocardiogram 2D Echocardiogram has been performed.  Mackenzie Peterson G Mia Milan 11/27/2019, 11:29 AM

## 2019-11-27 NOTE — ED Notes (Signed)
First contact. Change of shift. Family at bedside. Pt resting in bed. Appearing in no acute distress. Will continue to monitor.

## 2019-11-27 NOTE — Evaluation (Signed)
Physical Therapy Evaluation Patient Details Name: Mackenzie Peterson MRN: [id number] DOB: [birth year] Today's Date: 11/27/2019   History of Present Illness  Pt is a 86 y/o female admitted secondary to AMS, likely from delirium. PMH includes alzhiemers, HTN, and PVD.   Clinical Impression  Pt admitted secondary to problem above with deficits below. Pt with dementia at baseline; only oriented to self. Pt requiring min A +2 to stand and ambulate in room. Pt with LOB X2 requiring up to mod A. Per son, pt requires assist with ambulation and ADLs at baseline. Has 24/7 supervision at home and are planning for pt to return home at d/c. Will continue to follow acutely to maximize functional mobility independence and safety.     Follow Up Recommendations Home health PT;Supervision/Assistance - 24 hour    Equipment Recommendations  None recommended by PT    Recommendations for Other Services       Precautions / Restrictions Precautions Precautions: Fall Restrictions Weight Bearing Restrictions: No      Mobility  Bed Mobility Overal bed mobility: Needs Assistance Bed Mobility: Supine to Sit;Sit to Supine     Supine to sit: Supervision Sit to supine: Supervision   General bed mobility comments: Supervision for safety.     Transfers Overall transfer level: Needs assistance Equipment used: 2 person hand held assist Transfers: Sit to/from Stand Sit to Stand: Min assist;+2 physical assistance         General transfer comment: Min A for lift assist and steadying to stand.   Ambulation/Gait Ambulation/Gait assistance: +2 physical assistance;Mod assist;Min assist;+2 safety/equipment Gait Distance (Feet): 25 Feet Assistive device: 2 person hand held assist Gait Pattern/deviations: Step-through pattern;Decreased stride length;Staggering right;Staggering left;Drifts right/left;Narrow base of support Gait velocity: Decreased   General Gait Details: Pt with very narrow BOS. LOB X 2 during  session requiring up to mod A for steadying.   Stairs            Wheelchair Mobility    Modified Rankin (Stroke Patients Only)       Balance Overall balance assessment: Needs assistance Sitting-balance support: No upper extremity supported;Feet supported Sitting balance-Leahy Scale: Fair     Standing balance support: Bilateral upper extremity supported;During functional activity Standing balance-Leahy Scale: Poor Standing balance comment: Reliant on BUE support                              Pertinent Vitals/Pain Pain Assessment: Faces Faces Pain Scale: No hurt    Home Living Family/patient expects to be discharged to:: Private residence Living Arrangements: Children;Other relatives (grandchildren) Available Help at Discharge: Family;Available 24 hours/day Type of Home: House       Home Layout: One level Home Equipment: Wheelchair - manual      Prior Function Level of Independence: Needs assistance   Gait / Transfers Assistance Needed: Per son, pt requires assist to ambulate. Does use WC sometimes  ADL's / Homemaking Assistance Needed: Requires assist for ADLs.         Hand Dominance        Extremity/Trunk Assessment   Upper Extremity Assessment Upper Extremity Assessment: Defer to OT evaluation    Lower Extremity Assessment Lower Extremity Assessment: Generalized weakness    Cervical / Trunk Assessment Cervical / Trunk Assessment: Kyphotic  Communication   Communication: No difficulties  Cognition Arousal/Alertness: Awake/alert Behavior During Therapy: WFL for tasks assessed/performed Overall Cognitive Status: History of cognitive impairments - at baseline  General Comments: Dementia at baseline. Only oriented to person      General Comments General comments (skin integrity, edema, etc.): Pt's son present during session     Exercises     Assessment/Plan    PT Assessment  Patient needs continued PT services  PT Problem List Decreased strength;Decreased balance;Decreased mobility;Decreased cognition;Decreased safety awareness;Decreased knowledge of use of DME;Decreased knowledge of precautions;Decreased activity tolerance       PT Treatment Interventions Gait training;Functional mobility training;Therapeutic activities;Therapeutic exercise;Balance training;Patient/family education    PT Goals (Current goals can be found in the Care Plan section)  Acute Rehab PT Goals Patient Stated Goal: to go home per son PT Goal Formulation: With patient/family Time For Goal Achievement: 12/11/19 Potential to Achieve Goals: Good    Frequency Min 3X/week   Barriers to discharge        Co-evaluation               AM-PAC PT "6 Clicks" Mobility  Outcome Measure Help needed turning from your back to your side while in a flat bed without using bedrails?: None Help needed moving from lying on your back to sitting on the side of a flat bed without using bedrails?: None Help needed moving to and from a bed to a chair (including a wheelchair)?: A Little Help needed standing up from a chair using your arms (e.g., wheelchair or bedside chair)?: A Little Help needed to walk in hospital room?: A Lot Help needed climbing 3-5 steps with a railing? : A Lot 6 Click Score: 18    End of Session Equipment Utilized During Treatment: Gait belt Activity Tolerance: Patient tolerated treatment well Patient left: in bed;with call bell/phone within reach;with family/visitor present Nurse Communication: Mobility status PT Visit Diagnosis: Unsteadiness on feet (R26.81);Muscle weakness (generalized) (M62.81)    Time: 9628-3662 PT Time Calculation (min) (ACUTE ONLY): 25 min   Charges:   PT Evaluation $PT Eval Moderate Complexity: 1 Mod          Farley Ly, PT, DPT  Acute Rehabilitation Services  Pager: [phone] Office: [phone]   Lehman Prom 11/27/2019, 2:23 PM

## 2019-11-27 NOTE — Discharge Summary (Signed)
Physician Discharge Summary  Mackenzie Peterson IFO:[id number] DOB: 06/25/1924 DOA: 11/26/2019  PCP: [name], MD  Admit date: 11/26/2019 Discharge date: 11/27/2019  Admitted From: Home Disposition: Home  Recommendations for Outpatient Follow-up:  1. Follow ups as below. 2. Please obtain CBC/BMP/Mag at follow up 3. Please follow up on the following pending results: None.  Home Health: PT/OT Equipment/Devices: None.  Patient has appropriate DME.  Discharge Condition: Stable CODE STATUS: Full code   Follow-up Information    [name], MD. Schedule an appointment as soon as possible for a visit in 1 week(s).   Specialty: Family Medicine Contact information: [address] [place] [address] [phone]               Hospital Course: 86 year old Spanish-speaking female with history of dementia, mood disorder, hypothyroidism, hypertension and PVD presented to ED with altered mental status for about a day.  In ED, hemodynamically stable.  Normal saturation on room air.  Afebrile.  CBC and CMP without significant finding.  High-sensitivity troponin 336>> 270>> 250.  UA without significant finding but slightly elevated specific gravity.  CT head, cervical spine and maxillofacial without significant finding.  D-dimer marginally elevated to 1.03.  CTA chest negative for PE or any other acute finding.  Influenza PCR and COVID-19 PCR negative.  Cardiology consulted about troponin elevation and recommended conservative treatment with aspirin and statin that she is already on.  The next day, her encephalopathy resolved.  She is back to her baseline per patient's son at bedside who is somewhat surprised about the quick turnaround.  B12, TSH and RPR within normal.  MRI brain without acute significant finding.  Echocardiogram with normal LVEF, G1 DD and severe LAE.  EEG without seizure or epileptiform discharge.  Slightly elevated ammonia to 44.  She was evaluated by  therapy who recommended home health PT/OT.   Discharge Diagnoses:  Acute encephalopathy: Likely delirium.  She is also at risk for polypharmacy.  She is on Xanax, Seroquel and trazodone at home.  Discussed risk and benefit of these medications with patient's son in detail.  Recommended weaning of Xanax slowly and discussing the rest of her medication with her prescribed/PCP.  Elevated troponin: Likely demand ischemia and partly from dehydration. -Continue aspirin and statin per cardiology -Follow echocardiogram.  Dementia/mood disorder: Encephalopathy resolved. -Continue home medications  Debility/physical deconditioning -Home health PT/OT  Hypothyroidism: TSH within normal. -Continue home Synthroid.   There is no height or weight on file to calculate BMI.            Discharge Exam: Vitals:   11/27/19 1000 11/27/19 1157  BP: (!) 120/55 (!) 118/53  Pulse: 77 68  Resp: 19 17  Temp:  98.5 F (36.9 C)  SpO2: 95% 95%    GENERAL: No apparent distress.  Nontoxic. HEENT: MMM.  Vision and hearing grossly intact.  NECK: Supple.  No apparent JVD.  RESP: On room air.  No IWOB.  Fair aeration bilaterally. CVS:  RRR. Heart sounds normal.  ABD/GI/GU: Bowel sounds present. Soft. Non tender.  MSK/EXT:  Moves extremities. No apparent deformity. No edema.  SKIN: no apparent skin lesion or wound NEURO: Awake, alert and oriented to self, her son and month. Cranial, motor and sensory and reflexes grossly intact.   PSYCH: Calm. Normal affect. Discharge Instructions  Discharge Instructions    Call MD for:  difficulty breathing, headache or visual disturbances   Complete by: As directed    Call MD for:  extreme fatigue   Complete by: As directed    Call MD for:  severe uncontrolled pain   Complete by: As directed    Call MD for:  temperature >100.4   Complete by: As directed    Diet general   Complete by: As directed    Discharge instructions   Complete by: As directed    It has  been a pleasure taking care of you!  You were hospitalized due to altered mental status/confusion.  After the test's we have done, we worry that some of your home medications including alprazolam, quetiapine and trazodone could contribute to this.  We strongly recommend cutting down on alprazolam slowly if possible.  We also recommend you discuss your other medications with your primary care doctor or prescriber.  Please follow-up with your primary care doctor in 1 to 2 weeks.   We may have started you on other new medications or made some changes to your home medications during this hospitalization. Please review your new medication list and the directions carefully before you take them.    Please go to your hospital follow-up appointments or call to reschedule as recommended.   Take care,   Increase activity slowly   Complete by: As directed      Allergies as of 11/27/2019   No Known Allergies     Medication List    STOP taking these medications   Mucinex DM Maximum Strength 60-1200 MG Tb12     TAKE these medications   acetaminophen 500 MG tablet Commonly known as: TYLENOL Take 500-1,000 mg by mouth every 6 (six) hours as needed for mild pain or headache.   albuterol (2.5 MG/3ML) 0.083% nebulizer solution Commonly known as: PROVENTIL Take 2.5 mg by nebulization 3 (three) times daily as needed for wheezing or shortness of breath.   ALPRAZolam 0.5 MG tablet Commonly known as: XANAX Take 0.5 mg by mouth 3 (three) times daily as needed for anxiety.   aspirin 81 MG tablet Take 81 mg by mouth daily.   cetirizine 10 MG tablet Commonly known as: ZYRTEC Take 10 mg by mouth at bedtime.   donepezil 10 MG tablet Commonly known as: ARICEPT Take 10 mg by mouth at bedtime.   levothyroxine 25 MCG tablet Commonly known as: SYNTHROID Take 25 mcg by mouth daily before breakfast.   memantine 10 MG tablet Commonly known as: NAMENDA Take 10 mg by mouth 2 (two) times daily.     nitroGLYCERIN 0.4 MG SL tablet Commonly known as: NITROSTAT Place 0.4 mg under the tongue every 5 (five) minutes as needed for chest pain.   omeprazole 20 MG capsule Commonly known as: PRILOSEC TAKE 1 CAPSULE BY MOUTH 30 TO 60 MINUTES BEFORE THE FIRST AND LAST MEALS OF THE DAY What changed: See the new instructions.   One-A-Day Proactive 65+ Tabs Take 1 tablet by mouth daily with breakfast.   OXYGEN Inhale 2 L/min into the lungs at bedtime.   pravastatin 20 MG tablet Commonly known as: PRAVACHOL Take 20 mg by mouth at bedtime.   QUEtiapine 50 MG tablet Commonly known as: SEROQUEL Take 50 mg by mouth 2 (two) times daily.   sertraline 25 MG tablet Commonly known as: ZOLOFT Take 25 mg by mouth at bedtime.   solifenacin 5 MG tablet Commonly known as: VESICARE Take 5 mg by mouth at bedtime.   traZODone 50 MG tablet Commonly known as: DESYREL Take 50 mg by mouth at bedtime.       Consultations:  Cardiology  Procedures/Studies:  2D Echo on 11/27/2019 1. Left ventricular ejection fraction, by estimation, is 65 to 70%. The  left ventricle has hyperdynamic function. The left ventricle has no  regional wall motion abnormalities. There is mild left ventricular  hypertrophy. Left ventricular diastolic  parameters are consistent with Grade I diastolic dysfunction (impaired  relaxation).  2. Right ventricular systolic function is normal. The right ventricular  size is normal. The estimated right ventricular systolic pressure is 21.7  mmHg.  3. Left atrial size was severely dilated.  4. The mitral valve is degenerative. Mild mitral valve regurgitation. No  evidence of mitral stenosis. Moderate mitral annular calcification.  5. The aortic valve is tricuspid. Aortic valve regurgitation is trivial.  No aortic stenosis is present.  6. The inferior vena cava is normal in size with greater than 50%  respiratory variability, suggesting right atrial pressure of 3  mmHg.   CT Head Wo Contrast  Result Date: 11/26/2019 CLINICAL DATA:  Poly trauma, critical, head/cervical spine injury suspected. EXAM: CT HEAD WITHOUT CONTRAST CT MAXILLOFACIAL WITHOUT CONTRAST CT CERVICAL SPINE WITHOUT CONTRAST TECHNIQUE: Multidetector CT imaging of the head, cervical spine, and maxillofacial structures were performed using the standard protocol without intravenous contrast. Multiplanar CT image reconstructions of the cervical spine and maxillofacial structures were also generated. COMPARISON:  Head CT 05/07/2019.  CT cervical spine 05/07/2019 FINDINGS: CT HEAD FINDINGS Brain: Cerebral volume is normal for age. Unchanged subcentimeter focus of calcification within the left parietooccipital lobes (series 6, image 25). There is no acute intracranial hemorrhage. No demarcated cortical infarct. No extra-axial fluid collection. No evidence of intracranial mass. No midline shift. Vascular: No hyperdense vessel.  Atherosclerotic calcifications. Skull: Normal. Negative for fracture or focal lesion. Other: No significant mastoid effusion. CT MAXILLOFACIAL FINDINGS The examination is mildly motion degraded. Osseous: No acute maxillofacial fracture is identified. Orbits: No acute finding. The globes are normal in size and contour. The extraocular muscles and optic nerve sheath complexes are symmetric and unremarkable. Sinuses: No significant paranasal sinus disease. Soft tissues: Unremarkable. No appreciable soft tissue swelling by CT. CT CERVICAL SPINE FINDINGS Alignment: Cervical levocurvature. Trace C3-C4, C4-C5 and C7-T1 grade 1 anterolisthesis. Skull base and vertebrae: The basion-dental and atlanto-dental intervals are maintained.No evidence of acute fracture to the cervical spine. Soft tissues and spinal canal: No prevertebral fluid or swelling. No visible canal hematoma. Disc levels: Cervical spondylosis. Most notably at C5-C6, there is advanced disc space narrowing with a posterior disc  osteophyte complex and uncovertebral hypertrophy. No high-grade bony spinal canal stenosis. Upper chest: Similar to the prior examination 05/07/2019, there is extensive nodularity with associated tree-in-bud opacities within the imaged lung apices. No visible pneumothorax. IMPRESSION: CT head: No evidence of acute intracranial abnormality. CT maxillofacial: 1. Mildly motion degraded examination. 2. No evidence of acute maxillofacial fracture. CT cervical spine: 1. No evidence of acute fracture to the cervical spine. 2. A cervical levocurvature may be positional. 3. Mild C3-C4, C4-C5 and C7-T1 grade 1 anterolisthesis. 4. Cervical spondylosis as described and greatest at C5-C6. 5. Similar to the prior cervical spine CT of 05/07/2019, there is extensive nodularity and tree-in-bud opacities in the imaged lung apices. These findings are nonspecific but are favored to reflect postinflammatory nodules and/or chronic atypical infection. Electronically Signed   By: Jackey Loge DO   On: 11/26/2019 14:47   CT ANGIO CHEST PE W OR WO CONTRAST  Result Date: 11/26/2019 CLINICAL DATA:  86 year old female with concern for pulmonary embolism. EXAM: CT ANGIOGRAPHY CHEST WITH  CONTRAST TECHNIQUE: Multidetector CT imaging of the chest was performed using the standard protocol during bolus administration of intravenous contrast. Multiplanar CT image reconstructions and MIPs were obtained to evaluate the vascular anatomy. CONTRAST:  OMNIPAQUE IOHEXOL 300 MG/ML  SOLN COMPARISON:  Chest CT dated 04/30/2019. FINDINGS: Cardiovascular: There is no cardiomegaly or pericardial effusion. Coronary vascular calcification primarily involving the LAD. There is calcification of the mitral annulus. Moderate atherosclerotic calcification of the thoracic aorta. No aneurysmal dilatation or dissection. Mild dilatation of main pulmonary trunk suggestive of a degree of pulmonary hypertension. Clinical correlation is recommended. No pulmonary  artery embolus identified. Mediastinum/Nodes: There is no hilar or mediastinal adenopathy. Small right hilar calcified granuloma. The esophagus is grossly unremarkable. No mediastinal fluid collection. Lungs/Pleura: Extensive bilateral pulmonary nodules as seen on the prior CT. No new consolidation. There is no pleural effusion pneumothorax. The central airways are patent. Upper Abdomen: Bilateral adrenal thickening/hyperplasia versus adenoma. Musculoskeletal: Osteopenia with degenerative changes of the spine. No acute osseous pathology. Review of the MIP images confirms the above findings. IMPRESSION: 1. No acute intrathoracic pathology. No CT evidence of pulmonary artery embolus. 2. Extensive bilateral pulmonary nodules as seen on the prior CT. 3. Aortic Atherosclerosis (ICD10-I70.0). Electronically Signed   By: Elgie Collard M.D.   On: 11/26/2019 23:07   CT Cervical Spine Wo Contrast  Result Date: 11/26/2019 CLINICAL DATA:  Poly trauma, critical, head/cervical spine injury suspected. EXAM: CT HEAD WITHOUT CONTRAST CT MAXILLOFACIAL WITHOUT CONTRAST CT CERVICAL SPINE WITHOUT CONTRAST TECHNIQUE: Multidetector CT imaging of the head, cervical spine, and maxillofacial structures were performed using the standard protocol without intravenous contrast. Multiplanar CT image reconstructions of the cervical spine and maxillofacial structures were also generated. COMPARISON:  Head CT 05/07/2019.  CT cervical spine 05/07/2019 FINDINGS: CT HEAD FINDINGS Brain: Cerebral volume is normal for age. Unchanged subcentimeter focus of calcification within the left parietooccipital lobes (series 6, image 25). There is no acute intracranial hemorrhage. No demarcated cortical infarct. No extra-axial fluid collection. No evidence of intracranial mass. No midline shift. Vascular: No hyperdense vessel.  Atherosclerotic calcifications. Skull: Normal. Negative for fracture or focal lesion. Other: No significant mastoid effusion. CT  MAXILLOFACIAL FINDINGS The examination is mildly motion degraded. Osseous: No acute maxillofacial fracture is identified. Orbits: No acute finding. The globes are normal in size and contour. The extraocular muscles and optic nerve sheath complexes are symmetric and unremarkable. Sinuses: No significant paranasal sinus disease. Soft tissues: Unremarkable. No appreciable soft tissue swelling by CT. CT CERVICAL SPINE FINDINGS Alignment: Cervical levocurvature. Trace C3-C4, C4-C5 and C7-T1 grade 1 anterolisthesis. Skull base and vertebrae: The basion-dental and atlanto-dental intervals are maintained.No evidence of acute fracture to the cervical spine. Soft tissues and spinal canal: No prevertebral fluid or swelling. No visible canal hematoma. Disc levels: Cervical spondylosis. Most notably at C5-C6, there is advanced disc space narrowing with a posterior disc osteophyte complex and uncovertebral hypertrophy. No high-grade bony spinal canal stenosis. Upper chest: Similar to the prior examination 05/07/2019, there is extensive nodularity with associated tree-in-bud opacities within the imaged lung apices. No visible pneumothorax. IMPRESSION: CT head: No evidence of acute intracranial abnormality. CT maxillofacial: 1. Mildly motion degraded examination. 2. No evidence of acute maxillofacial fracture. CT cervical spine: 1. No evidence of acute fracture to the cervical spine. 2. A cervical levocurvature may be positional. 3. Mild C3-C4, C4-C5 and C7-T1 grade 1 anterolisthesis. 4. Cervical spondylosis as described and greatest at C5-C6. 5. Similar to the prior cervical spine CT of 05/07/2019, there  is extensive nodularity and tree-in-bud opacities in the imaged lung apices. These findings are nonspecific but are favored to reflect postinflammatory nodules and/or chronic atypical infection. Electronically Signed   By: Jackey LogeKyle  Golden DO   On: 11/26/2019 14:47   MR BRAIN WO CONTRAST  Result Date: 11/27/2019 CLINICAL DATA:   TIA.  Delirium. EXAM: MRI HEAD WITHOUT CONTRAST TECHNIQUE: Multiplanar, multiecho pulse sequences of the brain and surrounding structures were obtained without intravenous contrast. COMPARISON:  CT head 11/26/2019 FINDINGS: Brain: Ventricle size and cerebral volume normal for age. Mild hyperintensity in the periventricular and deep white matter bilaterally. Two areas of chronic microhemorrhage in the parietal lobes bilaterally. Negative for mass or edema. Negative for acute infarct. Vascular: Normal arterial flow voids. Skull and upper cervical spine: Negative Sinuses/Orbits: Mild mucosal edema paranasal sinuses. Bilateral cataract extraction Other: None IMPRESSION: No acute abnormality Mild white matter changes, typical for age. Electronically Signed   By: Marlan Palauharles  Clark M.D.   On: 11/27/2019 13:34   DG Chest Port 1 View  Result Date: 11/26/2019 CLINICAL DATA:  Altered mental status. EXAM: PORTABLE CHEST 1 VIEW COMPARISON:  May 07, 2019 FINDINGS: No substantial change in innumerable pulmonary nodules bilaterally. No focal consolidation. No visible pleural effusion or pneumothorax. Similar eventration of the left medial hemidiaphragm. Cardiomediastinal silhouette is within normal limits. Aortic atherosclerosis. No acute osseous abnormality. IMPRESSION: No significant interval change in diffuse bilateral pulmonary nodules. Electronically Signed   By: Feliberto HartsFrederick S Jones MD   On: 11/26/2019 13:46   EEG adult  Result Date: 11/27/2019 Charlsie QuestYadav, Priyanka O, MD     11/27/2019 10:13 AM Patient Name: Mackenzie Peterson MRN: [id number] Epilepsy Attending: Charlsie QuestPriyanka O Yadav Referring Physician/Provider: Dr. Candelaria Stagersaye Ocean Kearley Date: 11/27/2019 Duration: 23.40 mins Patient history: [age] year old female with altered mental status.  EEG evaluate for seizures. Level of alertness: Awake, asleep AEDs during EEG study: None Technical aspects: This EEG study was done with scalp electrodes positioned according to the 10-20 International system of  electrode placement. Electrical activity was acquired at a sampling rate of 500Hz  and reviewed with a high frequency filter of 70Hz  and a low frequency filter of 1Hz . EEG data were recorded continuously and digitally stored. Description: The posterior dominant rhythm consists of 8 Hz activity of moderate voltage (25-35 uV) seen predominantly in posterior head regions, symmetric and reactive to eye opening and eye closing. Sleep was characterized by vertex waves, sleep spindles (12 to 14 Hz), maximal frontocentral region.  EEG showed intermittent generalized 3 to 6 Hz theta-delta slowing.  Hyperventilation and photic stimulation were not performed.   ABNORMALITY -Intermittent slow, generalized IMPRESSION: This study is suggestive of mild diffuse encephalopathy, nonspecific etiology. No seizures or epileptiform discharges were seen throughout the recording. Charlsie QuestPriyanka O Yadav   CT Maxillofacial WO CM  Result Date: 11/26/2019 CLINICAL DATA:  Poly trauma, critical, head/cervical spine injury suspected. EXAM: CT HEAD WITHOUT CONTRAST CT MAXILLOFACIAL WITHOUT CONTRAST CT CERVICAL SPINE WITHOUT CONTRAST TECHNIQUE: Multidetector CT imaging of the head, cervical spine, and maxillofacial structures were performed using the standard protocol without intravenous contrast. Multiplanar CT image reconstructions of the cervical spine and maxillofacial structures were also generated. COMPARISON:  Head CT 05/07/2019.  CT cervical spine 05/07/2019 FINDINGS: CT HEAD FINDINGS Brain: Cerebral volume is normal for age. Unchanged subcentimeter focus of calcification within the left parietooccipital lobes (series 6, image 25). There is no acute intracranial hemorrhage. No demarcated cortical infarct. No extra-axial fluid collection. No evidence of intracranial mass. No midline shift. Vascular: No hyperdense vessel.  Atherosclerotic calcifications. Skull: Normal. Negative for fracture or focal lesion. Other: No significant mastoid  effusion. CT MAXILLOFACIAL FINDINGS The examination is mildly motion degraded. Osseous: No acute maxillofacial fracture is identified. Orbits: No acute finding. The globes are normal in size and contour. The extraocular muscles and optic nerve sheath complexes are symmetric and unremarkable. Sinuses: No significant paranasal sinus disease. Soft tissues: Unremarkable. No appreciable soft tissue swelling by CT. CT CERVICAL SPINE FINDINGS Alignment: Cervical levocurvature. Trace C3-C4, C4-C5 and C7-T1 grade 1 anterolisthesis. Skull base and vertebrae: The basion-dental and atlanto-dental intervals are maintained.No evidence of acute fracture to the cervical spine. Soft tissues and spinal canal: No prevertebral fluid or swelling. No visible canal hematoma. Disc levels: Cervical spondylosis. Most notably at C5-C6, there is advanced disc space narrowing with a posterior disc osteophyte complex and uncovertebral hypertrophy. No high-grade bony spinal canal stenosis. Upper chest: Similar to the prior examination 05/07/2019, there is extensive nodularity with associated tree-in-bud opacities within the imaged lung apices. No visible pneumothorax. IMPRESSION: CT head: No evidence of acute intracranial abnormality. CT maxillofacial: 1. Mildly motion degraded examination. 2. No evidence of acute maxillofacial fracture. CT cervical spine: 1. No evidence of acute fracture to the cervical spine. 2. A cervical levocurvature may be positional. 3. Mild C3-C4, C4-C5 and C7-T1 grade 1 anterolisthesis. 4. Cervical spondylosis as described and greatest at C5-C6. 5. Similar to the prior cervical spine CT of 05/07/2019, there is extensive nodularity and tree-in-bud opacities in the imaged lung apices. These findings are nonspecific but are favored to reflect postinflammatory nodules and/or chronic atypical infection. Electronically Signed   By: Jackey Loge DO   On: 11/26/2019 14:47        The results of significant diagnostics from  this hospitalization (including imaging, microbiology, ancillary and laboratory) are listed below for reference.     Microbiology: Recent Results (from the past 240 hour(s))  Urine culture     Status: None   Collection Time: 11/26/19 12:58 PM   Specimen: Urine, Catheterized  Result Value Ref Range Status   Specimen Description URINE, CATHETERIZED  Final   Special Requests NONE  Final   Culture   Final    NO GROWTH Performed at [facility], [address] [place], [place] [address]    Report Status 11/27/2019 FINAL  Final  Respiratory Panel by RT PCR (Flu A&B, Covid) - Nasopharyngeal Swab     Status: None   Collection Time: 11/26/19  5:06 PM   Specimen: Nasopharyngeal Swab  Result Value Ref Range Status   SARS Coronavirus 2 by RT PCR NEGATIVE NEGATIVE Final    Comment: (NOTE) SARS-CoV-2 target nucleic acids are NOT DETECTED.  The SARS-CoV-2 RNA is generally detectable in upper respiratoy specimens during the acute phase of infection. The lowest concentration of SARS-CoV-2 viral copies this assay can detect is 131 copies/mL. A negative result does not preclude SARS-Cov-2 infection and should not be used as the sole basis for treatment or other patient management decisions. A negative result may occur with  improper specimen collection/handling, submission of specimen other than nasopharyngeal swab, presence of viral mutation(s) within the areas targeted by this assay, and inadequate number of viral copies (<131 copies/mL). A negative result must be combined with clinical observations, patient history, and epidemiological information. The expected result is Negative.  Fact Sheet for Patients:  https://www.moore.com/  Fact Sheet for Healthcare Providers:  https://www.young.biz/  This test is no t yet approved or cleared by the [place] and  has been authorized for detection and/or diagnosis of SARS-CoV-2 by FDA under an  Emergency Use Authorization (EUA). This EUA will remain  in effect (meaning this test can be used) for the duration of the COVID-19 declaration under Section 564(b)(1) of the Act, 21 U.S.C. section 360bbb-3(b)(1), unless the authorization is terminated or revoked sooner.     Influenza A by PCR NEGATIVE NEGATIVE Final   Influenza B by PCR NEGATIVE NEGATIVE Final    Comment: (NOTE) The Xpert Xpress SARS-CoV-2/FLU/RSV assay is intended as an aid in  the diagnosis of influenza from Nasopharyngeal swab specimens and  should not be used as a sole basis for treatment. Nasal washings and  aspirates are unacceptable for Xpert Xpress SARS-CoV-2/FLU/RSV  testing.  Fact Sheet for Patients: https://www.moore.com/  Fact Sheet for Healthcare Providers: https://www.young.biz/  This test is not yet approved or cleared by the [place] FDA and  has been authorized for detection and/or diagnosis of SARS-CoV-2 by  FDA under an Emergency Use Authorization (EUA). This EUA will remain  in effect (meaning this test can be used) for the duration of the  Covid-19 declaration under Section 564(b)(1) of the Act, 21  U.S.C. section 360bbb-3(b)(1), unless the authorization is  terminated or revoked. Performed at [facility], [address] [place], [place] [address]      Labs: BNP (last 3 results) No results for input(s): BNP in the last 8760 hours. Basic Metabolic Panel: Recent Labs  Lab 11/26/19 1544 11/26/19 1829  NA 139  --   K 4.0  --   CL 104  --   CO2 23  --   GLUCOSE 95  --   BUN 20  --   CREATININE 0.82 0.84  CALCIUM 8.6*  --    Liver Function Tests: Recent Labs  Lab 11/26/19 1544  AST 32  ALT 24  ALKPHOS 78  BILITOT 1.0  PROT 6.6  ALBUMIN 3.5   No results for input(s): LIPASE, AMYLASE in the last 168 hours. Recent Labs  Lab 11/27/19 1055  AMMONIA 44*   CBC: Recent Labs  Lab 11/26/19 1544 11/26/19 1829  WBC 10.5 9.7   NEUTROABS 8.1*  --   HGB 15.7* 15.9*  HCT 47.9* 47.6*  MCV 92.5 91.5  PLT 225 242   Cardiac Enzymes: No results for input(s): CKTOTAL, CKMB, CKMBINDEX, TROPONINI in the last 168 hours. BNP: Invalid input(s): POCBNP CBG: Recent Labs  Lab 11/26/19 1829  GLUCAP 94   D-Dimer Recent Labs    11/26/19 1829  DDIMER 1.03*   Hgb A1c No results for input(s): HGBA1C in the last 72 hours. Lipid Profile No results for input(s): CHOL, HDL, LDLCALC, TRIG, CHOLHDL, LDLDIRECT in the last 72 hours. Thyroid function studies Recent Labs    11/27/19 0752  TSH 2.119   Anemia work up Recent Labs    11/27/19 0752  VITAMINB12 792   Urinalysis    Component Value Date/Time   COLORURINE YELLOW 11/26/2019 1322   APPEARANCEUR CLEAR 11/26/2019 1322   LABSPEC >1.030 (H) 11/26/2019 1322   PHURINE 5.5 11/26/2019 1322   GLUCOSEU NEGATIVE 11/26/2019 1322   HGBUR SMALL (A) 11/26/2019 1322   BILIRUBINUR NEGATIVE 11/26/2019 1322   KETONESUR NEGATIVE 11/26/2019 1322   PROTEINUR NEGATIVE 11/26/2019 1322   UROBILINOGEN 0.2 06/17/2012 1645   NITRITE NEGATIVE 11/26/2019 1322   LEUKOCYTESUR TRACE (A) 11/26/2019 1322   Sepsis Labs Invalid input(s): PROCALCITONIN,  WBC,  LACTICIDVEN   Time coordinating discharge: 35 minutes  SIGNED:  Almon Hercules, MD  [facility] 11/27/2019, 1:54 PM  If 7PM-7AM, please contact night-coverage [email or web address]

## 2019-11-27 NOTE — Procedures (Signed)
Patient Name: Mackenzie Peterson  MRN: [id number]  Epilepsy Attending: Charlsie Quest  Referring Physician/Provider: Dr. Candelaria Stagers Date: 11/27/2019 Duration: 23.40 mins  Patient history: 86 year old female with altered mental status.  EEG evaluate for seizures.  Level of alertness: Awake, asleep  AEDs during EEG study: None  Technical aspects: This EEG study was done with scalp electrodes positioned according to the 10-20 International system of electrode placement. Electrical activity was acquired at a sampling rate of 500Hz  and reviewed with a high frequency filter of 70Hz  and a low frequency filter of 1Hz . EEG data were recorded continuously and digitally stored.   Description: The posterior dominant rhythm consists of 8 Hz activity of moderate voltage (25-35 uV) seen predominantly in posterior head regions, symmetric and reactive to eye opening and eye closing. Sleep was characterized by vertex waves, sleep spindles (12 to 14 Hz), maximal frontocentral region.  EEG showed intermittent generalized 3 to 6 Hz theta-delta slowing.  Hyperventilation and photic stimulation were not performed.     ABNORMALITY -Intermittent slow, generalized  IMPRESSION: This study is suggestive of mild diffuse encephalopathy, nonspecific etiology. No seizures or epileptiform discharges were seen throughout the recording.  Ashlee Bewley 

## 2019-11-27 NOTE — ED Notes (Signed)
Report to Crystal RN.

## 2019-11-27 NOTE — Progress Notes (Signed)
OT Cancellation Note  Patient Details Name: Mackenzie Peterson MRN: [id number] DOB: 1924/07/29   Cancelled Treatment:    Reason Eval/Treat Not Completed: (P) Active bedrest order  [facility] 11/27/2019, 12:08 PM  Luisa Dago, OT/L   Acute OT Clinical Specialist Acute Rehabilitation Services Pager [phone] Office [phone]

## 2019-11-27 NOTE — ED Notes (Signed)
Son at bedside is leaving will be back later.

## 2019-11-27 NOTE — ED Notes (Signed)
Pt resting in bed. Family at bedside. NADN

## 2019-11-27 NOTE — Evaluation (Addendum)
Occupational Therapy Evaluation Patient Details Name: Mackenzie Peterson MRN: [id number] DOB: 07/28/1924 Today's Date: 11/27/2019    History of Present Illness Pt is a 86 y/o female admitted secondary to AMS, likely from delirium. PMH includes alzhiemers, HTN, and PVD.    Clinical Impression   PTA pt living with family, and requiring assist for ADL and mobility. Son is present to provide information, for pt is poor historian. In person interpreter present for pt, but interpreter stated pt is quite confused (hx of dementia at baseline). [place] but his wife and children care for pt full time. He states that his wife helps with bathing, dressing, feeding, and mobility. Pt can mobilize short distance with assist, but mostly uses w/c. At time of eval, pt able to complete bed mobility with supervision assist and sit <> stands with min A +2 for steadying support. Pt was able to mobilize to bathroom for toilet transfer. She then mobilized to the sink for standing grooming tasks which she required cues to appropriately sequence. Provided son with dementia education, specifically with managing wandering behaviors in PM. Given current status, recommend HHOT at d/c for continued progression of ADL safety in home environment. OT will sign off in anticipation for d/c this PM.    Follow Up Recommendations  Home health OT;Supervision/Assistance - 24 hour    Equipment Recommendations  None recommended by OT    Recommendations for Other Services       Precautions / Restrictions Precautions Precautions: Fall Restrictions Weight Bearing Restrictions: No      Mobility Bed Mobility Overal bed mobility: Needs Assistance Bed Mobility: Supine to Sit;Sit to Supine     Supine to sit: Supervision Sit to supine: Supervision   General bed mobility comments: Supervision for safety.     Transfers Overall transfer level: Needs assistance Equipment used: 2 person hand held  assist Transfers: Sit to/from Stand Sit to Stand: Min assist;+2 physical assistance         General transfer comment: assist to power into standing and steady self    Balance Overall balance assessment: Needs assistance Sitting-balance support: No upper extremity supported;Feet supported Sitting balance-Leahy Scale: Fair     Standing balance support: Bilateral upper extremity supported;During functional activity Standing balance-Leahy Scale: Poor Standing balance comment: Reliant on BUE support                            ADL either performed or assessed with clinical judgement   ADL Overall ADL's : Needs assistance/impaired Eating/Feeding: Set up;Sitting   Grooming: Minimal assistance;Standing Grooming Details (indicate cue type and reason): to wash hands. Min A needed for steadying and tactile cues needed to sequence task Upper Body Bathing: Minimal assistance;Sitting   Lower Body Bathing: Moderate assistance;Sitting/lateral leans;Sit to/from stand   Upper Body Dressing : Minimal assistance;Sitting   Lower Body Dressing: Moderate assistance;Sitting/lateral leans;Sit to/from stand   Toilet Transfer: Minimal assistance;+2 for physical assistance;+2 for safety/equipment;Ambulation;Regular Teacher, adult education Details (indicate cue type and reason): 2 person hand held assist to walk into bathroom for toilet transfer Toileting- Clothing Manipulation and Hygiene: Set up;Sitting/lateral lean Toileting - Clothing Manipulation Details (indicate cue type and reason): anterior peri care     Functional mobility during ADLs: Minimal assistance;+2 for safety/equipment;+2 for physical assistance;Cueing for safety;Cueing for sequencing       Vision Patient Visual Report: No change from baseline Vision Assessment?: No apparent visual deficits  Perception     Praxis      Pertinent Vitals/Pain Pain Assessment: No/denies pain Faces Pain Scale: No hurt     Hand  Dominance     Extremity/Trunk Assessment Upper Extremity Assessment Upper Extremity Assessment: Generalized weakness   Lower Extremity Assessment Lower Extremity Assessment: Generalized weakness   Cervical / Trunk Assessment Cervical / Trunk Assessment: Kyphotic   Communication Communication Communication: No difficulties   Cognition Arousal/Alertness: Awake/alert Behavior During Therapy: WFL for tasks assessed/performed Overall Cognitive Status: History of cognitive impairments - at baseline                                 General Comments: History of dementia at baseline, oriented to person. Unable to orient self to place despite max cues and choices. Increased processing time and cues needed   General Comments  Pt's son present during session     Exercises     Shoulder Instructions      Home Living Family/patient expects to be discharged to:: Private residence Living Arrangements: Children;Other relatives Available Help at Discharge: Family;Available 24 hours/day Type of Home: House       Home Layout: One level     Bathroom Shower/Tub: Chief Strategy Officer: Standard     Home Equipment: Wheelchair - manual          Prior Functioning/Environment Level of Independence: Needs assistance  Gait / Transfers Assistance Needed: Per son, pt requires assist to ambulate. Does use WC sometimes ADL's / Homemaking Assistance Needed: Requires assist for ADLs.             OT Problem List: Decreased strength;Decreased knowledge of use of DME or AE;Decreased cognition;Decreased activity tolerance;Impaired balance (sitting and/or standing);Decreased safety awareness      OT Treatment/Interventions:      OT Goals(Current goals can be found in the care plan section) Acute Rehab OT Goals Patient Stated Goal: to go home per son OT Goal Formulation: All assessment and education complete, DC therapy  OT Frequency:     Barriers to D/C:             Co-evaluation              AM-PAC OT "6 Clicks" Daily Activity     Outcome Measure Help from another person eating meals?: A Little Help from another person taking care of personal grooming?: A Little Help from another person toileting, which includes using toliet, bedpan, or urinal?: A Lot Help from another person bathing (including washing, rinsing, drying)?: A Lot Help from another person to put on and taking off regular upper body clothing?: A Little Help from another person to put on and taking off regular lower body clothing?: A Lot 6 Click Score: 15   End of Session Nurse Communication: Mobility status  Activity Tolerance: Patient tolerated treatment well Patient left: in chair;with call bell/phone within reach  OT Visit Diagnosis: Unsteadiness on feet (R26.81);Other abnormalities of gait and mobility (R26.89);Muscle weakness (generalized) (M62.81);Other symptoms and signs involving cognitive function                Time: 8756-4332 OT Time Calculation (min): 25 min Charges:  OT General Charges $OT Visit: 1 Visit OT Evaluation $OT Eval Moderate Complexity: 1 Mod  Dalphine Handing, MSOT, OTR/L Acute Rehabilitation Services [facility] Office Number: [phone] Pager: [phone]  Dalphine Handing 11/27/2019, 5:51 PM

## 2019-11-27 NOTE — TOC Transition Note (Signed)
Transition of Care [facility]) - CM/SW Discharge Note   Patient Details  Name: Mackenzie Peterson MRN: [id number] Date of Birth: 01-02-1925  [facility] CM/SW Contact:  Kermit Balo, RN Phone Number: 11/27/2019, 2:26 PM   Clinical Narrative:    Pt is discharging home with [facility]. Britney with [facility] accepted the referral.  DME at home: wheelchair/ hospital bed/ oxygen/ camera to monitor her at night Pt has supervision at home and transportation to home.   Final next level of care: Home w Home Health Services Barriers to Discharge: No Barriers Identified   Patient Goals and CMS Choice   CMS Medicare.gov Compare Post Acute Care list provided to:: Patient Represenative (must comment) Choice offered to / list presented to : Adult Children  Discharge Placement                       Discharge Plan and Services                          HH Arranged: PT, OT HH Agency: [facility] Date [facility] Agency Contacted: 11/27/19   Representative spoke with at [facility] Agency: Brayton El  Social Determinants of Health (SDOH) Interventions     Readmission Risk Interventions No flowsheet data found.

## 2019-11-27 NOTE — Progress Notes (Signed)
EEG completed, results pending. 

## 2019-11-27 NOTE — Progress Notes (Signed)
PIV's and tele removed. D/C instructions given to and explained to patient and her son. Both expressed understanding. Patient discharged home. Transferred to private vehicle with son via wheelchair.

## 2019-11-27 NOTE — Progress Notes (Signed)
PT Cancellation Note  Patient Details Name: Tanis Hensarling MRN: [id number] DOB: Nov 25, 1924   Cancelled Treatment:    Reason Eval/Treat Not Completed: Active bedrest order Pt currently with bed rest orders. Will follow up as activity orders updated and as schedule allows.   Cindee Salt, DPT  Acute Rehabilitation Services  Pager: [phone] Office: [phone]    Lehman Prom 11/27/2019, 9:24 AM

## 2019-11-28 ENCOUNTER — Other Ambulatory Visit: Payer: Self-pay

## 2019-11-28 ENCOUNTER — Inpatient Hospital Stay (HOSPITAL_COMMUNITY): Payer: Medicare Other | Admitting: Anesthesiology

## 2019-11-28 ENCOUNTER — Encounter (HOSPITAL_COMMUNITY)
Admission: EM | Disposition: A | Discharge status: Skilled Nursing Facility | Payer: Self-pay | Source: Home / Self Care | Attending: Internal Medicine

## 2019-11-28 ENCOUNTER — Encounter (HOSPITAL_COMMUNITY): Payer: Self-pay | Admitting: Internal Medicine

## 2019-11-28 ENCOUNTER — Emergency Department (HOSPITAL_COMMUNITY): Payer: Medicare Other

## 2019-11-28 ENCOUNTER — Inpatient Hospital Stay (HOSPITAL_COMMUNITY): Payer: Medicare Other

## 2019-11-28 ENCOUNTER — Inpatient Hospital Stay (HOSPITAL_COMMUNITY)
Admission: EM | Admit: 2019-11-28 | Discharge: 2019-12-05 | DRG: 481 | Disposition: A | Discharge status: Skilled Nursing Facility | Payer: Medicare Other | Attending: Internal Medicine | Admitting: Internal Medicine

## 2019-11-28 DIAGNOSIS — I739 Peripheral vascular disease, unspecified: Secondary | ICD-10-CM | POA: Diagnosis present

## 2019-11-28 DIAGNOSIS — W19XXXA Unspecified fall, initial encounter: Secondary | ICD-10-CM | POA: Diagnosis present

## 2019-11-28 DIAGNOSIS — Z79890 Hormone replacement therapy (postmenopausal): Secondary | ICD-10-CM

## 2019-11-28 DIAGNOSIS — R569 Unspecified convulsions: Secondary | ICD-10-CM | POA: Diagnosis present

## 2019-11-28 DIAGNOSIS — N390 Urinary tract infection, site not specified: Secondary | ICD-10-CM | POA: Diagnosis not present

## 2019-11-28 DIAGNOSIS — D649 Anemia, unspecified: Secondary | ICD-10-CM | POA: Diagnosis present

## 2019-11-28 DIAGNOSIS — Z20822 Contact with and (suspected) exposure to covid-19: Secondary | ICD-10-CM | POA: Diagnosis present

## 2019-11-28 DIAGNOSIS — R110 Nausea: Secondary | ICD-10-CM | POA: Diagnosis not present

## 2019-11-28 DIAGNOSIS — K219 Gastro-esophageal reflux disease without esophagitis: Secondary | ICD-10-CM | POA: Diagnosis present

## 2019-11-28 DIAGNOSIS — S7223XA Displaced subtrochanteric fracture of unspecified femur, initial encounter for closed fracture: Secondary | ICD-10-CM | POA: Diagnosis present

## 2019-11-28 DIAGNOSIS — Z419 Encounter for procedure for purposes other than remedying health state, unspecified: Secondary | ICD-10-CM

## 2019-11-28 DIAGNOSIS — D72829 Elevated white blood cell count, unspecified: Secondary | ICD-10-CM | POA: Diagnosis not present

## 2019-11-28 DIAGNOSIS — G40909 Epilepsy, unspecified, not intractable, without status epilepticus: Secondary | ICD-10-CM | POA: Diagnosis not present

## 2019-11-28 DIAGNOSIS — R300 Dysuria: Secondary | ICD-10-CM | POA: Diagnosis not present

## 2019-11-28 DIAGNOSIS — S72144A Nondisplaced intertrochanteric fracture of right femur, initial encounter for closed fracture: Secondary | ICD-10-CM | POA: Diagnosis not present

## 2019-11-28 DIAGNOSIS — J4530 Mild persistent asthma, uncomplicated: Secondary | ICD-10-CM | POA: Diagnosis present

## 2019-11-28 DIAGNOSIS — D62 Acute posthemorrhagic anemia: Secondary | ICD-10-CM | POA: Diagnosis not present

## 2019-11-28 DIAGNOSIS — E876 Hypokalemia: Secondary | ICD-10-CM | POA: Diagnosis not present

## 2019-11-28 DIAGNOSIS — D696 Thrombocytopenia, unspecified: Secondary | ICD-10-CM | POA: Diagnosis present

## 2019-11-28 DIAGNOSIS — E039 Hypothyroidism, unspecified: Secondary | ICD-10-CM | POA: Diagnosis present

## 2019-11-28 DIAGNOSIS — F0280 Dementia in other diseases classified elsewhere without behavioral disturbance: Secondary | ICD-10-CM | POA: Diagnosis present

## 2019-11-28 DIAGNOSIS — F05 Delirium due to known physiological condition: Secondary | ICD-10-CM | POA: Diagnosis present

## 2019-11-28 DIAGNOSIS — Z9981 Dependence on supplemental oxygen: Secondary | ICD-10-CM

## 2019-11-28 DIAGNOSIS — R197 Diarrhea, unspecified: Secondary | ICD-10-CM | POA: Diagnosis not present

## 2019-11-28 DIAGNOSIS — I10 Essential (primary) hypertension: Secondary | ICD-10-CM | POA: Diagnosis present

## 2019-11-28 DIAGNOSIS — Y92009 Unspecified place in unspecified non-institutional (private) residence as the place of occurrence of the external cause: Secondary | ICD-10-CM

## 2019-11-28 DIAGNOSIS — Z7982 Long term (current) use of aspirin: Secondary | ICD-10-CM | POA: Diagnosis not present

## 2019-11-28 DIAGNOSIS — F39 Unspecified mood [affective] disorder: Secondary | ICD-10-CM | POA: Diagnosis present

## 2019-11-28 DIAGNOSIS — S72141A Displaced intertrochanteric fracture of right femur, initial encounter for closed fracture: Secondary | ICD-10-CM | POA: Diagnosis present

## 2019-11-28 DIAGNOSIS — E785 Hyperlipidemia, unspecified: Secondary | ICD-10-CM | POA: Diagnosis present

## 2019-11-28 DIAGNOSIS — G309 Alzheimer's disease, unspecified: Secondary | ICD-10-CM | POA: Diagnosis present

## 2019-11-28 DIAGNOSIS — Z79899 Other long term (current) drug therapy: Secondary | ICD-10-CM

## 2019-11-28 DIAGNOSIS — R410 Disorientation, unspecified: Secondary | ICD-10-CM | POA: Diagnosis not present

## 2019-11-28 DIAGNOSIS — S0181XA Laceration without foreign body of other part of head, initial encounter: Secondary | ICD-10-CM | POA: Diagnosis present

## 2019-11-28 DIAGNOSIS — E860 Dehydration: Secondary | ICD-10-CM | POA: Diagnosis not present

## 2019-11-28 DIAGNOSIS — S72001A Fracture of unspecified part of neck of right femur, initial encounter for closed fracture: Secondary | ICD-10-CM | POA: Diagnosis present

## 2019-11-28 HISTORY — PX: INTRAMEDULLARY (IM) NAIL INTERTROCHANTERIC: SHX5875

## 2019-11-28 LAB — BASIC METABOLIC PANEL
Anion gap: 14 (ref 5–15)
BUN: 20 mg/dL (ref 8–23)
CO2: 19 mmol/L — ABNORMAL LOW (ref 22–32)
Calcium: 8.5 mg/dL — ABNORMAL LOW (ref 8.9–10.3)
Chloride: 109 mmol/L (ref 98–111)
Creatinine, Ser: 0.96 mg/dL (ref 0.44–1.00)
GFR, Estimated: 55 mL/min — ABNORMAL LOW (ref >60)
Glucose, Bld: 118 mg/dL — ABNORMAL HIGH (ref 70–99)
Potassium: 3.7 mmol/L (ref 3.5–5.1)
Sodium: 142 mmol/L (ref 135–145)

## 2019-11-28 LAB — ABO/RH: ABO/RH(D): B POS

## 2019-11-28 LAB — CBC WITH DIFFERENTIAL/PLATELET
Abs Immature Granulocytes: 0.08 10*3/uL — ABNORMAL HIGH (ref 0.00–0.07)
Basophils Absolute: 0 10*3/uL (ref 0.0–0.1)
Basophils Relative: 0 %
Eosinophils Absolute: 0 10*3/uL (ref 0.0–0.5)
Eosinophils Relative: 0 %
HCT: 42 % (ref 36.0–46.0)
Hemoglobin: 13.7 g/dL (ref 12.0–15.0)
Immature Granulocytes: 0 %
Lymphocytes Relative: 4 %
Lymphs Abs: 0.7 10*3/uL (ref 0.7–4.0)
MCH: 30.7 pg (ref 26.0–34.0)
MCHC: 32.6 g/dL (ref 30.0–36.0)
MCV: 94.2 fL (ref 80.0–100.0)
Monocytes Absolute: 1.4 10*3/uL — ABNORMAL HIGH (ref 0.1–1.0)
Monocytes Relative: 7 %
Neutro Abs: 17.4 10*3/uL — ABNORMAL HIGH (ref 1.7–7.7)
Neutrophils Relative %: 89 %
Platelets: 243 10*3/uL (ref 150–400)
RBC: 4.46 MIL/uL (ref 3.87–5.11)
RDW: 13.4 % (ref 11.5–15.5)
WBC: 19.7 10*3/uL — ABNORMAL HIGH (ref 4.0–10.5)
nRBC: 0 % (ref 0.0–0.2)

## 2019-11-28 LAB — RESPIRATORY PANEL BY RT PCR (FLU A&B, COVID)
Influenza A by PCR: NEGATIVE
Influenza B by PCR: NEGATIVE
SARS Coronavirus 2 by RT PCR: NEGATIVE

## 2019-11-28 LAB — PROTIME-INR
INR: 1.2 (ref 0.8–1.2)
Prothrombin Time: 15 seconds (ref 11.4–15.2)

## 2019-11-28 SURGERY — FIXATION, FRACTURE, INTERTROCHANTERIC, WITH INTRAMEDULLARY ROD
Anesthesia: General | Site: Hip | Laterality: Right

## 2019-11-28 MED ORDER — EPHEDRINE SULFATE 50 MG/ML IJ SOLN
INTRAMUSCULAR | Status: DC | PRN
  Administered 2019-11-28: 5 mg via INTRAVENOUS

## 2019-11-28 MED ORDER — ONDANSETRON HCL 4 MG/2ML IJ SOLN: 4.0000 mg | Freq: Once | INTRAMUSCULAR | Status: DC | PRN

## 2019-11-28 MED ORDER — ADULT MULTIVITAMIN W/MINERALS CH
1.0000 | ORAL_TABLET | Freq: Every day | ORAL | Status: DC
  Administered 2019-11-29 – 2019-12-05 (×7): 1 via ORAL
  Filled 2019-11-28 (×7): qty 1

## 2019-11-28 MED ORDER — 0.9 % SODIUM CHLORIDE (POUR BTL) OPTIME
TOPICAL | Status: DC | PRN
  Administered 2019-11-28: 1000 mL

## 2019-11-28 MED ORDER — PANTOPRAZOLE SODIUM 40 MG PO TBEC
40.0000 mg | DELAYED_RELEASE_TABLET | Freq: Every day | ORAL | Status: DC
  Administered 2019-11-29 – 2019-12-05 (×7): 40 mg via ORAL
  Filled 2019-11-28 (×7): qty 1

## 2019-11-28 MED ORDER — ONDANSETRON HCL 4 MG/2ML IJ SOLN: 4.0000 mg | Freq: Four times a day (QID) | INTRAMUSCULAR | Status: DC | PRN

## 2019-11-28 MED ORDER — LORATADINE 10 MG PO TABS
10.0000 mg | ORAL_TABLET | Freq: Every day | ORAL | Status: DC
  Administered 2019-11-29 – 2019-12-05 (×7): 10 mg via ORAL
  Filled 2019-11-28 (×7): qty 1

## 2019-11-28 MED ORDER — QUETIAPINE FUMARATE 50 MG PO TABS
50.0000 mg | ORAL_TABLET | Freq: Two times a day (BID) | ORAL | Status: DC
  Administered 2019-11-28 – 2019-12-05 (×14): 50 mg via ORAL
  Filled 2019-11-28 (×14): qty 1

## 2019-11-28 MED ORDER — ROCURONIUM BROMIDE 10 MG/ML (PF) SYRINGE
PREFILLED_SYRINGE | INTRAVENOUS | Status: DC | PRN
  Administered 2019-11-28: 30 mg via INTRAVENOUS

## 2019-11-28 MED ORDER — SERTRALINE HCL 25 MG PO TABS
25.0000 mg | ORAL_TABLET | Freq: Every day | ORAL | Status: DC
  Administered 2019-11-28 – 2019-12-04 (×7): 25 mg via ORAL
  Filled 2019-11-28 (×8): qty 1

## 2019-11-28 MED ORDER — CHLORHEXIDINE GLUCONATE 0.12 % MT SOLN: 15.0000 mL | Freq: Once | OROMUCOSAL | Status: AC

## 2019-11-28 MED ORDER — CHLORHEXIDINE GLUCONATE 0.12 % MT SOLN
OROMUCOSAL | Status: AC
  Administered 2019-11-28: 15 mL via OROMUCOSAL
  Filled 2019-11-28: qty 15

## 2019-11-28 MED ORDER — TRAMADOL HCL 50 MG PO TABS: 50.0000 mg | ORAL_TABLET | Freq: Four times a day (QID) | ORAL | 0 refills | Status: DC | PRN

## 2019-11-28 MED ORDER — CEFAZOLIN SODIUM-DEXTROSE 2-4 GM/100ML-% IV SOLN
INTRAVENOUS | Status: AC
  Filled 2019-11-28: qty 100

## 2019-11-28 MED ORDER — ACETAMINOPHEN 500 MG PO TABS
500.0000 mg | ORAL_TABLET | Freq: Four times a day (QID) | ORAL | Status: DC | PRN
  Administered 2019-12-01 – 2019-12-05 (×4): 1000 mg via ORAL
  Filled 2019-11-28 (×4): qty 2

## 2019-11-28 MED ORDER — HYDROMORPHONE HCL 2 MG PO TABS: 1.0000 mg | ORAL_TABLET | Freq: Four times a day (QID) | ORAL | Status: DC | PRN

## 2019-11-28 MED ORDER — FENTANYL CITRATE (PF) 100 MCG/2ML IJ SOLN
25.0000 ug | Freq: Once | INTRAMUSCULAR | Status: AC
  Administered 2019-11-28: 25 ug via INTRAVENOUS
  Filled 2019-11-28: qty 2

## 2019-11-28 MED ORDER — METOCLOPRAMIDE HCL 5 MG PO TABS: 5.0000 mg | ORAL_TABLET | Freq: Three times a day (TID) | ORAL | Status: DC | PRN

## 2019-11-28 MED ORDER — LACTATED RINGERS IV SOLN: INTRAVENOUS | Status: DC

## 2019-11-28 MED ORDER — PHENYLEPHRINE HCL-NACL 10-0.9 MG/250ML-% IV SOLN
INTRAVENOUS | Status: DC | PRN
  Administered 2019-11-28: 50 ug/min via INTRAVENOUS

## 2019-11-28 MED ORDER — MEMANTINE HCL 10 MG PO TABS
10.0000 mg | ORAL_TABLET | Freq: Two times a day (BID) | ORAL | Status: DC
  Administered 2019-11-28 – 2019-12-05 (×14): 10 mg via ORAL
  Filled 2019-11-28 (×14): qty 1

## 2019-11-28 MED ORDER — SUCCINYLCHOLINE CHLORIDE 20 MG/ML IJ SOLN
INTRAMUSCULAR | Status: DC | PRN
  Administered 2019-11-28: 120 mg via INTRAVENOUS

## 2019-11-28 MED ORDER — ACETAMINOPHEN 500 MG PO TABS
500.0000 mg | ORAL_TABLET | Freq: Four times a day (QID) | ORAL | Status: AC
  Administered 2019-11-28 – 2019-11-29 (×3): 500 mg via ORAL
  Filled 2019-11-28 (×3): qty 1

## 2019-11-28 MED ORDER — DARIFENACIN HYDROBROMIDE ER 7.5 MG PO TB24
7.5000 mg | ORAL_TABLET | Freq: Every day | ORAL | Status: DC
  Administered 2019-11-28 – 2019-12-05 (×8): 7.5 mg via ORAL
  Filled 2019-11-28 (×8): qty 1

## 2019-11-28 MED ORDER — ONDANSETRON HCL 4 MG/2ML IJ SOLN
INTRAMUSCULAR | Status: DC | PRN
  Administered 2019-11-28: 4 mg via INTRAVENOUS

## 2019-11-28 MED ORDER — FENTANYL CITRATE (PF) 250 MCG/5ML IJ SOLN
INTRAMUSCULAR | Status: AC
  Filled 2019-11-28: qty 5

## 2019-11-28 MED ORDER — ENOXAPARIN SODIUM 30 MG/0.3ML ~~LOC~~ SOLN
30.0000 mg | SUBCUTANEOUS | Status: DC
  Administered 2019-11-29 – 2019-12-05 (×7): 30 mg via SUBCUTANEOUS
  Filled 2019-11-28 (×7): qty 0.3

## 2019-11-28 MED ORDER — TRAZODONE HCL 50 MG PO TABS
50.0000 mg | ORAL_TABLET | Freq: Every day | ORAL | Status: DC
  Administered 2019-11-28: 50 mg via ORAL
  Filled 2019-11-28: qty 1

## 2019-11-28 MED ORDER — PHENOL 1.4 % MT LIQD
1.0000 | OROMUCOSAL | Status: DC | PRN
  Administered 2019-12-04: 1 via OROMUCOSAL
  Filled 2019-11-28: qty 177

## 2019-11-28 MED ORDER — DONEPEZIL HCL 10 MG PO TABS
10.0000 mg | ORAL_TABLET | Freq: Every day | ORAL | Status: DC
  Administered 2019-11-28 – 2019-12-04 (×7): 10 mg via ORAL
  Filled 2019-11-28 (×8): qty 1

## 2019-11-28 MED ORDER — HYDROMORPHONE HCL 1 MG/ML IJ SOLN
0.2500 mg | INTRAMUSCULAR | Status: DC | PRN
  Administered 2019-11-28: 0.25 mg via INTRAVENOUS

## 2019-11-28 MED ORDER — ALBUMIN HUMAN 5 % IV SOLN: INTRAVENOUS | Status: DC | PRN

## 2019-11-28 MED ORDER — ALBUTEROL SULFATE (2.5 MG/3ML) 0.083% IN NEBU: 2.5000 mg | INHALATION_SOLUTION | Freq: Three times a day (TID) | RESPIRATORY_TRACT | Status: DC | PRN

## 2019-11-28 MED ORDER — MENTHOL 3 MG MT LOZG: 1.0000 | LOZENGE | OROMUCOSAL | Status: DC | PRN

## 2019-11-28 MED ORDER — METOCLOPRAMIDE HCL 5 MG/ML IJ SOLN: 5.0000 mg | Freq: Three times a day (TID) | INTRAMUSCULAR | Status: DC | PRN

## 2019-11-28 MED ORDER — CEFAZOLIN SODIUM-DEXTROSE 2-4 GM/100ML-% IV SOLN
2.0000 g | INTRAVENOUS | Status: AC
  Administered 2019-11-28: 2 g via INTRAVENOUS

## 2019-11-28 MED ORDER — DEXAMETHASONE SODIUM PHOSPHATE 10 MG/ML IJ SOLN
INTRAMUSCULAR | Status: DC | PRN
  Administered 2019-11-28: 5 mg via INTRAVENOUS

## 2019-11-28 MED ORDER — SUGAMMADEX SODIUM 200 MG/2ML IV SOLN
INTRAVENOUS | Status: DC | PRN
  Administered 2019-11-28: 100 mg via INTRAVENOUS

## 2019-11-28 MED ORDER — HYDROMORPHONE HCL 1 MG/ML IJ SOLN
INTRAMUSCULAR | Status: AC
  Filled 2019-11-28: qty 1

## 2019-11-28 MED ORDER — LIDOCAINE 2% (20 MG/ML) 5 ML SYRINGE
INTRAMUSCULAR | Status: DC | PRN
  Administered 2019-11-28: 100 mg via INTRAVENOUS

## 2019-11-28 MED ORDER — HYDROCODONE-ACETAMINOPHEN 5-325 MG PO TABS
1.0000 | ORAL_TABLET | Freq: Four times a day (QID) | ORAL | Status: DC | PRN
  Administered 2019-11-29: 2 via ORAL
  Administered 2019-11-29: 1 via ORAL
  Administered 2019-11-30: 2 via ORAL
  Administered 2019-12-01: 1 via ORAL
  Administered 2019-12-01 – 2019-12-03 (×5): 2 via ORAL
  Administered 2019-12-03: 1 via ORAL
  Administered 2019-12-04: 2 via ORAL
  Administered 2019-12-04: 1 via ORAL
  Administered 2019-12-04 – 2019-12-05 (×2): 2 via ORAL
  Filled 2019-11-28 (×2): qty 2
  Filled 2019-11-28: qty 1
  Filled 2019-11-28 (×6): qty 2
  Filled 2019-11-28: qty 1
  Filled 2019-11-28 (×4): qty 2
  Filled 2019-11-28: qty 1
  Filled 2019-11-28: qty 2

## 2019-11-28 MED ORDER — FENTANYL CITRATE (PF) 100 MCG/2ML IJ SOLN
50.0000 ug | Freq: Once | INTRAMUSCULAR | Status: AC
  Administered 2019-11-28: 50 ug via INTRAVENOUS
  Filled 2019-11-28: qty 2

## 2019-11-28 MED ORDER — STERILE WATER FOR IRRIGATION IR SOLN
Status: DC | PRN
  Administered 2019-11-28: 1000 mL

## 2019-11-28 MED ORDER — POVIDONE-IODINE 10 % EX SWAB: 2.0000 "application " | Freq: Once | CUTANEOUS | Status: DC

## 2019-11-28 MED ORDER — DOCUSATE SODIUM 100 MG PO CAPS
100.0000 mg | ORAL_CAPSULE | Freq: Two times a day (BID) | ORAL | Status: DC
  Administered 2019-11-28 – 2019-11-30 (×5): 100 mg via ORAL
  Filled 2019-11-28 (×5): qty 1

## 2019-11-28 MED ORDER — LEVOTHYROXINE SODIUM 25 MCG PO TABS
25.0000 ug | ORAL_TABLET | Freq: Every day | ORAL | Status: DC
  Administered 2019-11-29 – 2019-12-05 (×7): 25 ug via ORAL
  Filled 2019-11-28 (×7): qty 1

## 2019-11-28 MED ORDER — ALPRAZOLAM 0.5 MG PO TABS: 0.5000 mg | ORAL_TABLET | Freq: Three times a day (TID) | ORAL | Status: DC | PRN

## 2019-11-28 MED ORDER — ONDANSETRON HCL 4 MG PO TABS
4.0000 mg | ORAL_TABLET | Freq: Four times a day (QID) | ORAL | Status: DC | PRN
  Administered 2019-12-04: 4 mg via ORAL
  Filled 2019-11-28: qty 1

## 2019-11-28 MED ORDER — NITROGLYCERIN 0.4 MG SL SUBL: 0.4000 mg | SUBLINGUAL_TABLET | SUBLINGUAL | Status: DC | PRN

## 2019-11-28 MED ORDER — CHLORHEXIDINE GLUCONATE 4 % EX LIQD
60.0000 mL | Freq: Once | CUTANEOUS | Status: DC
  Filled 2019-11-28: qty 60

## 2019-11-28 MED ORDER — CEFAZOLIN SODIUM-DEXTROSE 2-4 GM/100ML-% IV SOLN
2.0000 g | Freq: Four times a day (QID) | INTRAVENOUS | Status: AC
  Administered 2019-11-28 – 2019-11-29 (×2): 2 g via INTRAVENOUS
  Filled 2019-11-28 (×2): qty 100

## 2019-11-28 MED ORDER — PROPOFOL 10 MG/ML IV BOLUS
INTRAVENOUS | Status: DC | PRN
  Administered 2019-11-28: 80 mg via INTRAVENOUS

## 2019-11-28 MED ORDER — FENTANYL CITRATE (PF) 100 MCG/2ML IJ SOLN
INTRAMUSCULAR | Status: DC | PRN
  Administered 2019-11-28: 50 ug via INTRAVENOUS
  Administered 2019-11-28: 100 ug via INTRAVENOUS
  Administered 2019-11-28 (×2): 25 ug via INTRAVENOUS

## 2019-11-28 MED ORDER — MEPERIDINE HCL 25 MG/ML IJ SOLN: 6.2500 mg | INTRAMUSCULAR | Status: DC | PRN

## 2019-11-28 MED ORDER — MORPHINE SULFATE (PF) 2 MG/ML IV SOLN
0.5000 mg | INTRAVENOUS | Status: DC | PRN
  Administered 2019-11-29 – 2019-12-04 (×11): 0.5 mg via INTRAVENOUS
  Filled 2019-11-28 (×11): qty 1

## 2019-11-28 SURGICAL SUPPLY — 35 items
BIT DRILL CALIBRATED 4.2 (BIT) ×1 IMPLANT
BNDG COHESIVE 6X5 TAN STRL LF (GAUZE/BANDAGES/DRESSINGS) ×6 IMPLANT
CANISTER SUCT 3000ML PPV (MISCELLANEOUS) ×3 IMPLANT
COVER PERINEAL POST (MISCELLANEOUS) ×3 IMPLANT
COVER SURGICAL LIGHT HANDLE (MISCELLANEOUS) ×3 IMPLANT
DRAPE C-ARM 42X72 X-RAY (DRAPES) ×3 IMPLANT
DRAPE INCISE IOBAN 66X45 STRL (DRAPES) ×3 IMPLANT
DRILL BIT CALIBRATED 4.2 (BIT) ×3
DRSG ADAPTIC 3X8 NADH LF (GAUZE/BANDAGES/DRESSINGS) ×3 IMPLANT
DURAPREP 26ML APPLICATOR (WOUND CARE) ×3 IMPLANT
ELECT REM PT RETURN 9FT ADLT (ELECTROSURGICAL) ×3
ELECTRODE REM PT RTRN 9FT ADLT (ELECTROSURGICAL) ×1 IMPLANT
GAUZE SPONGE 4X4 12PLY STRL (GAUZE/BANDAGES/DRESSINGS) ×3 IMPLANT
GLOVE BIO SURGEON STRL SZ7.5 (GLOVE) ×6 IMPLANT
GLOVE BIOGEL PI IND STRL 8 (GLOVE) ×2 IMPLANT
GLOVE BIOGEL PI INDICATOR 8 (GLOVE) ×4
GOWN STRL REUS W/ TWL LRG LVL3 (GOWN DISPOSABLE) ×1 IMPLANT
GOWN STRL REUS W/ TWL XL LVL3 (GOWN DISPOSABLE) ×2 IMPLANT
GOWN STRL REUS W/TWL LRG LVL3 (GOWN DISPOSABLE) ×2
GOWN STRL REUS W/TWL XL LVL3 (GOWN DISPOSABLE) ×4
GUIDEWIRE 3.2X400 (WIRE) ×6 IMPLANT
KIT BASIN OR (CUSTOM PROCEDURE TRAY) ×3 IMPLANT
KIT TURNOVER KIT B (KITS) ×3 IMPLANT
NAIL TROCH FIX 10X235 RT 130 (Nail) ×3 IMPLANT
NS IRRIG 1000ML POUR BTL (IV SOLUTION) ×3 IMPLANT
PACK GENERAL/GYN (CUSTOM PROCEDURE TRAY) ×3 IMPLANT
PAD ARMBOARD 7.5X6 YLW CONV (MISCELLANEOUS) ×6 IMPLANT
SCREW FEMORAL NECK PERF 90 (Screw) ×3 IMPLANT
SCREW LOCKING 5.0X32MM (Screw) ×3 IMPLANT
STAPLER VISISTAT 35W (STAPLE) ×3 IMPLANT
SUT MON AB 2-0 CT1 36 (SUTURE) ×3 IMPLANT
SYR BULB IRRIG 60ML STRL (SYRINGE) ×3 IMPLANT
TOWEL GREEN STERILE (TOWEL DISPOSABLE) ×3 IMPLANT
TOWEL GREEN STERILE FF (TOWEL DISPOSABLE) ×3 IMPLANT
WATER STERILE IRR 1000ML POUR (IV SOLUTION) ×3 IMPLANT

## 2019-11-28 NOTE — ED Notes (Signed)
Got patient undress on the monitor did ekg shown to Dr Zavitz patient is resting with call bell in reach 

## 2019-11-28 NOTE — Consult Note (Signed)
Reason for Consult:Right hip fx Referring Physician: [name]  Mackenzie Peterson is an 86 y.o. female.  HPI: Mackenzie Peterson was at home this morning and suffered an unwitnessed fall. She was unable to get up. Her family found her on the ground after a short time and she was brought to the ED. She c/o right hip pain. X-rays showed a right hip fx and orthopedic surgery was consulted. She was just discharged yesterday after suffering some undiagnosed neurologic event earlier this week. She does not ambulate much at home but it able to unassisted.  Past Medical History:  Diagnosis Date  . Alzheimer disease (HCC)   . Asthma   . Chronic bronchitis (HCC)   . Dysthymic disorder   . Emphysema   . Hyperlipidemia   . Hypertension   . hypothyroid   . PVD (peripheral vascular disease) (HCC)     No past surgical history on file.  No family history on file.  Social History:  reports that she has never smoked. She has never used smokeless tobacco. She reports that she does not drink alcohol and does not use drugs.  Allergies: No Known Allergies  Medications: I have reviewed the patient's current medications.  Results for orders placed or performed during the hospital encounter of 11/26/19 (from the past 48 hour(s))  Respiratory Panel by RT PCR (Flu A&B, Covid) - Nasopharyngeal Swab     Status: None   Collection Time: 11/26/19  5:06 PM   Specimen: Nasopharyngeal Swab  Result Value Ref Range   SARS Coronavirus 2 by RT PCR NEGATIVE NEGATIVE    Comment: (NOTE) SARS-CoV-2 target nucleic acids are NOT DETECTED.  The SARS-CoV-2 RNA is generally detectable in upper respiratoy specimens during the acute phase of infection. The lowest concentration of SARS-CoV-2 viral copies this assay can detect is 131 copies/mL. A negative result does not preclude SARS-Cov-2 infection and should not be used as the sole basis for treatment or other patient management decisions. A negative result may occur with  improper  specimen collection/handling, submission of specimen other than nasopharyngeal swab, presence of viral mutation(s) within the areas targeted by this assay, and inadequate number of viral copies (<131 copies/mL). A negative result must be combined with clinical observations, patient history, and epidemiological information. The expected result is Negative.  Fact Sheet for Patients:  https://www.moore.com/  Fact Sheet for Healthcare Providers:  https://www.young.biz/  This test is no t yet approved or cleared by the [place] FDA and  has been authorized for detection and/or diagnosis of SARS-CoV-2 by FDA under an Emergency Use Authorization (EUA). This EUA will remain  in effect (meaning this test can be used) for the duration of the COVID-19 declaration under Section 564(b)(1) of the Act, 21 U.S.C. section 360bbb-3(b)(1), unless the authorization is terminated or revoked sooner.     Influenza A by PCR NEGATIVE NEGATIVE   Influenza B by PCR NEGATIVE NEGATIVE    Comment: (NOTE) The Xpert Xpress SARS-CoV-2/FLU/RSV assay is intended as an aid in  the diagnosis of influenza from Nasopharyngeal swab specimens and  should not be used as a sole basis for treatment. Nasal washings and  aspirates are unacceptable for Xpert Xpress SARS-CoV-2/FLU/RSV  testing.  Fact Sheet for Patients: https://www.moore.com/  Fact Sheet for Healthcare Providers: https://www.young.biz/  This test is not yet approved or cleared by the [place] FDA and  has been authorized for detection and/or diagnosis of SARS-CoV-2 by  FDA under an Emergency Use Authorization (EUA). This EUA will remain  in effect (meaning this test can be used) for the duration of the  Covid-19 declaration under Section 564(b)(1) of the Act, 21  U.S.C. section 360bbb-3(b)(1), unless the authorization is  terminated or revoked. Performed at [facility], [address], [place], [place] [address]   Troponin I (High Sensitivity)     Status: Abnormal   Collection Time: 11/26/19  6:29 PM  Result Value Ref Range   Troponin I (High Sensitivity) 336 (HH) <18 ng/L    Comment: CRITICAL RESULT CALLED TO, READ BACK BY AND VERIFIED WITH: L.VENEGAS RN 1921 11/26/19 MCCORMICK K (NOTE) Elevated high sensitivity troponin I (hsTnI) values and significant  changes across serial measurements may suggest ACS but many other  chronic and acute conditions are known to elevate hsTnI results.  Refer to the Links section for chest pain algorithms and additional  guidance. Performed at [facility], [address], [place], [place] [address]   D-dimer, quantitative (not at [facility])     Status: Abnormal   Collection Time: 11/26/19  6:29 PM  Result Value Ref Range   D-Dimer, Quant 1.03 (H) 0.00 - 0.50 ug/mL-FEU    Comment: (NOTE) At the manufacturer cut-off value of 0.5 g/mL FEU, this assay has a negative predictive value of 95-100%.This assay is intended for use in conjunction with a clinical pretest probability (PTP) assessment model to exclude pulmonary embolism (PE) and deep venous thrombosis (DVT) in outpatients suspected of PE or DVT. Results should be correlated with clinical presentation. Performed at [facility], [address] [place], [place] [address]   CBC     Status: Abnormal   Collection Time: 11/26/19  6:29 PM  Result Value Ref Range   WBC 9.7 4.0 - 10.5 K/uL   RBC 5.20 (H) 3.87 - 5.11 MIL/uL   Hemoglobin 15.9 (H) 12.0 - 15.0 g/dL   HCT 60.1 (H) 36 - 46 %   MCV 91.5 80.0 - 100.0 fL   MCH 30.6 26.0 - 34.0 pg   MCHC 33.4 30.0 - 36.0 g/dL   RDW 09.3 23.5 - 57.3 %   Platelets 242 150 - 400 K/uL   nRBC 0.0 0.0 - 0.2 %    Comment: Performed at [facility], [address] [place], [place] [address]  Creatinine, serum     Status: None   Collection Time: 11/26/19  6:29 PM  Result Value Ref Range   Creatinine,  Ser 0.84 0.44 - 1.00 mg/dL   GFR, Estimated >42 >70 mL/min    Comment: (NOTE) Calculated using the CKD-EPI Creatinine Equation (2021) Performed at [facility], [address], [place], [place] [address]   CBG monitoring, ED     Status: None   Collection Time: 11/26/19  6:29 PM  Result Value Ref Range   Glucose-Capillary 94 70 - 99 mg/dL    Comment: Glucose reference range applies only to samples taken after fasting for at least 8 hours.  Troponin I (High Sensitivity)     Status: Abnormal   Collection Time: 11/26/19  8:22 PM  Result Value Ref Range   Troponin I (High Sensitivity) 270 (HH) <18 ng/L    Comment: CRITICAL VALUE NOTED.  VALUE IS CONSISTENT WITH PREVIOUSLY REPORTED AND CALLED VALUE. (NOTE) Elevated high sensitivity troponin I (hsTnI) values and significant  changes across serial measurements may suggest ACS but many other  chronic and acute conditions are known to elevate hsTnI results.  Refer to the Links section  for chest pain algorithms and additional  guidance. Performed at [facility], [address], [place], [place] [address]   Troponin I (High Sensitivity)     Status: Abnormal   Collection Time: 11/27/19 12:14 AM  Result Value Ref Range   Troponin I (High Sensitivity) 250 (HH) <18 ng/L    Comment: CRITICAL VALUE NOTED.  VALUE IS CONSISTENT WITH PREVIOUSLY REPORTED AND CALLED VALUE. (NOTE) Elevated high sensitivity troponin I (hsTnI) values and significant  changes across serial measurements may suggest ACS but many other  chronic and acute conditions are known to elevate hsTnI results.  Refer to the Links section for chest pain algorithms and additional  guidance. Performed at [facility], [address], [place], [place] [address]   RPR     Status: None   Collection Time: 11/27/19  7:50 AM  Result Value Ref Range   RPR Ser Ql NON REACTIVE NON REACTIVE    Comment: Performed at [facility], [address] [place], [place]  [address]  TSH     Status: None   Collection Time: 11/27/19  7:52 AM  Result Value Ref Range   TSH 2.119 0.350 - 4.500 uIU/mL    Comment: Performed by a 3rd Generation assay with a functional sensitivity of <=0.01 uIU/mL. Performed at [facility], [address], [place], [place] [address]   Vitamin B12     Status: None   Collection Time: 11/27/19  7:52 AM  Result Value Ref Range   Vitamin B-12 792 180 - 914 pg/mL    Comment: (NOTE) This assay is not validated for testing neonatal or myeloproliferative syndrome specimens for Vitamin B12 levels. Performed at [facility], [address], [place], [place] [address]   Ammonia     Status: Abnormal   Collection Time: 11/27/19 10:55 AM  Result Value Ref Range   Ammonia 44 (H) 9 - 35 umol/L    Comment: Performed at [facility], [address] [place], [place] [address]    DG Knee 2 Views Right  Result Date: 11/28/2019 CLINICAL DATA:  Larey Seat.  Right hip pain. EXAM: RIGHT KNEE - 1-2 VIEW COMPARISON:  None. FINDINGS: The right knee joint is maintained. No acute fracture is identified. IMPRESSION: No acute bony findings. Electronically Signed   By: Mackenzie Meyer M.D.   On: 11/28/2019 14:51   CT Head Wo Contrast  Result Date: 11/28/2019 CLINICAL DATA:  Neck trauma. Patient fell in was seen yesterday. Patient had another fall today. EXAM: CT HEAD WITHOUT CONTRAST CT CERVICAL SPINE WITHOUT CONTRAST TECHNIQUE: Multidetector CT imaging of the head and cervical spine was performed following the standard protocol without intravenous contrast. Multiplanar CT image reconstructions of the cervical spine were also generated. COMPARISON:  11/26/2019 FINDINGS: CT HEAD FINDINGS Brain: There is minimal periventricular white matter change. There is no intra or extra-axial fluid collection or mass lesion. The basilar cisterns and ventricles have a normal appearance. There is no CT evidence for acute infarction or hemorrhage. Vascular: There is  atherosclerotic calcification of the internal carotid arteries. No hyperdense vessels. Skull: Normal. Negative for fracture or focal lesion. Sinuses/Orbits: No acute finding. Other: None CT CERVICAL SPINE FINDINGS There is significant patient motion artifact. Alignment: There is trace, stable anterolisthesis of C3 on C4 and C7 on T1. Trace retrolisthesis of C5 on C6. Otherwise alignment is normal. Skull base and vertebrae: No acute fracture. No primary bone lesion or focal pathologic process.  Soft tissues and spinal canal: No prevertebral fluid or swelling. No visible canal hematoma. Disc levels: Moderate disc height loss and uncovertebral spurring primarily at C4-5, C5-6, C6-7. Upper chest: Negative. Other: None IMPRESSION: 1. No evidence for acute intracranial abnormality. 2. Minimal periventricular white matter changes. 3. Patient motion artifact. 4. Moderate mid cervical degenerative changes. 5. No evidence for acute cervical spine abnormality. Electronically Signed   By: Norva PavlovElizabeth  Brown M.D.   On: 11/28/2019 14:36   CT ANGIO CHEST PE W OR WO CONTRAST  Result Date: 11/26/2019 CLINICAL DATA:  86 year old female with concern for pulmonary embolism. EXAM: CT ANGIOGRAPHY CHEST WITH CONTRAST TECHNIQUE: Multidetector CT imaging of the chest was performed using the standard protocol during bolus administration of intravenous contrast. Multiplanar CT image reconstructions and MIPs were obtained to evaluate the vascular anatomy. CONTRAST:  100mL OMNIPAQUE IOHEXOL 300 MG/ML  SOLN COMPARISON:  Chest CT dated 04/30/2019. FINDINGS: Cardiovascular: There is no cardiomegaly or pericardial effusion. Coronary vascular calcification primarily involving the LAD. There is calcification of the mitral annulus. Moderate atherosclerotic calcification of the thoracic aorta. No aneurysmal dilatation or dissection. Mild dilatation of main pulmonary trunk suggestive of a degree of pulmonary hypertension. Clinical correlation is  recommended. No pulmonary artery embolus identified. Mediastinum/Nodes: There is no hilar or mediastinal adenopathy. Small right hilar calcified granuloma. The esophagus is grossly unremarkable. No mediastinal fluid collection. Lungs/Pleura: Extensive bilateral pulmonary nodules as seen on the prior CT. No new consolidation. There is no pleural effusion pneumothorax. The central airways are patent. Upper Abdomen: Bilateral adrenal thickening/hyperplasia versus adenoma. Musculoskeletal: Osteopenia with degenerative changes of the spine. No acute osseous pathology. Review of the MIP images confirms the above findings. IMPRESSION: 1. No acute intrathoracic pathology. No CT evidence of pulmonary artery embolus. 2. Extensive bilateral pulmonary nodules as seen on the prior CT. 3. Aortic Atherosclerosis (ICD10-I70.0). Electronically Signed   By: Mackenzie CollardArash  Peterson M.D.   On: 11/26/2019 23:07   CT Cervical Spine Wo Contrast  Result Date: 11/28/2019 CLINICAL DATA:  Neck trauma. Patient fell in was seen yesterday. Patient had another fall today. EXAM: CT HEAD WITHOUT CONTRAST CT CERVICAL SPINE WITHOUT CONTRAST TECHNIQUE: Multidetector CT imaging of the head and cervical spine was performed following the standard protocol without intravenous contrast. Multiplanar CT image reconstructions of the cervical spine were also generated. COMPARISON:  11/26/2019 FINDINGS: CT HEAD FINDINGS Brain: There is minimal periventricular white matter change. There is no intra or extra-axial fluid collection or mass lesion. The basilar cisterns and ventricles have a normal appearance. There is no CT evidence for acute infarction or hemorrhage. Vascular: There is atherosclerotic calcification of the internal carotid arteries. No hyperdense vessels. Skull: Normal. Negative for fracture or focal lesion. Sinuses/Orbits: No acute finding. Other: None CT CERVICAL SPINE FINDINGS There is significant patient motion artifact. Alignment: There is  trace, stable anterolisthesis of C3 on C4 and C7 on T1. Trace retrolisthesis of C5 on C6. Otherwise alignment is normal. Skull base and vertebrae: No acute fracture. No primary bone lesion or focal pathologic process. Soft tissues and spinal canal: No prevertebral fluid or swelling. No visible canal hematoma. Disc levels: Moderate disc height loss and uncovertebral spurring primarily at C4-5, C5-6, C6-7. Upper chest: Negative. Other: None IMPRESSION: 1. No evidence for acute intracranial abnormality. 2. Minimal periventricular white matter changes. 3. Patient motion artifact. 4. Moderate mid cervical degenerative changes. 5. No evidence for acute cervical spine abnormality. Electronically Signed   By: Norva PavlovElizabeth  Brown M.D.   On: 11/28/2019 14:36  MR BRAIN WO CONTRAST  Result Date: 11/27/2019 CLINICAL DATA:  TIA.  Delirium. EXAM: MRI HEAD WITHOUT CONTRAST TECHNIQUE: Multiplanar, multiecho pulse sequences of the brain and surrounding structures were obtained without intravenous contrast. COMPARISON:  CT head 11/26/2019 FINDINGS: Brain: Ventricle size and cerebral volume normal for age. Mild hyperintensity in the periventricular and deep white matter bilaterally. Two areas of chronic microhemorrhage in the parietal lobes bilaterally. Negative for mass or edema. Negative for acute infarct. Vascular: Normal arterial flow voids. Skull and upper cervical spine: Negative Sinuses/Orbits: Mild mucosal edema paranasal sinuses. Bilateral cataract extraction Other: None IMPRESSION: No acute abnormality Mild white matter changes, typical for age. Electronically Signed   By: Marlan Palau M.D.   On: 11/27/2019 13:34   EEG adult  Result Date: 11/27/2019 Mackenzie Quest, Peterson     11/27/2019 10:13 AM Patient Name: Mackenzie Peterson MRN: [id number] Epilepsy Attending: Charlsie Peterson Referring Physician/Provider: Dr. Candelaria Peterson Date: 11/27/2019 Duration: 23.40 mins Patient history: 86 year old female with altered mental  status.  EEG evaluate for seizures. Level of alertness: Awake, asleep AEDs during EEG study: None Technical aspects: This EEG study was done with scalp electrodes positioned according to the 10-20 International system of electrode placement. Electrical activity was acquired at a sampling rate of 500Hz  and reviewed with a high frequency filter of 70Hz  and a low frequency filter of 1Hz . EEG data were recorded continuously and digitally stored. Description: The posterior dominant rhythm consists of 8 Hz activity of moderate voltage (25-35 uV) seen predominantly in posterior head regions, symmetric and reactive to eye opening and eye closing. Sleep was characterized by vertex waves, sleep spindles (12 to 14 Hz), maximal frontocentral region.  EEG showed intermittent generalized 3 to 6 Hz theta-delta slowing.  Hyperventilation and photic stimulation were not performed.   ABNORMALITY -Intermittent slow, generalized IMPRESSION: This study is suggestive of mild diffuse encephalopathy, nonspecific etiology. No seizures or epileptiform discharges were seen throughout the recording. Mackenzie Peterson   ECHOCARDIOGRAM COMPLETE  Result Date: 11/27/2019    ECHOCARDIOGRAM REPORT   Patient Name:   TRANAE LARAMIE Date of Exam: 11/27/2019 Medical Rec #:  [id number]     Height:       59.0 in Accession #:    [id number]    Weight:       110.2 lb Date of Birth:  [birth year]     BSA:          1.432 m Patient Age:    89 years      BP:           124/52 mmHg Patient Gender: F             HR:           65 bpm. Exam Location:  Inpatient Procedure: 2D Echo, Cardiac Doppler and Color Doppler Indications:    Cerebral Vascular Accident [id number]  History:        Patient has no prior history of Echocardiogram examinations.                 Risk Factors:Hypertension and Dyslipidemia. Hypothyroidism.  Sonographer:    Elmarie Shiley Dance Referring Phys: [id number] EKTA V PATEL IMPRESSIONS  1. Left ventricular ejection fraction, by estimation, is 65 to 70%. The  left ventricle has hyperdynamic function. The left ventricle has no regional wall motion abnormalities. There is mild left ventricular hypertrophy. Left ventricular diastolic parameters are consistent with Grade I diastolic dysfunction (impaired relaxation).  2. Right ventricular systolic function is normal.  The right ventricular size is normal. The estimated right ventricular systolic pressure is 21.7 mmHg.  3. Left atrial size was severely dilated.  4. The mitral valve is degenerative. Mild mitral valve regurgitation. No evidence of mitral stenosis. Moderate mitral annular calcification.  5. The aortic valve is tricuspid. Aortic valve regurgitation is trivial. No aortic stenosis is present.  6. The inferior vena cava is normal in size with greater than 50% respiratory variability, suggesting right atrial pressure of 3 mmHg. FINDINGS  Left Ventricle: Left ventricular ejection fraction, by estimation, is 65 to 70%. The left ventricle has hyperdynamic function. The left ventricle has no regional wall motion abnormalities. The left ventricular internal cavity size was normal in size. There is mild left ventricular hypertrophy. Left ventricular diastolic parameters are consistent with Grade I diastolic dysfunction (impaired relaxation). Right Ventricle: The right ventricular size is normal. No increase in right ventricular wall thickness. Right ventricular systolic function is normal. The tricuspid regurgitant velocity is 2.16 m/s, and with an assumed right atrial pressure of 3 mmHg, the estimated right ventricular systolic pressure is 21.7 mmHg. Left Atrium: Left atrial size was severely dilated. Right Atrium: Right atrial size was normal in size. Pericardium: There is no evidence of pericardial effusion. Mitral Valve: The mitral valve is degenerative in appearance. Moderate mitral annular calcification. Mild mitral valve regurgitation. No evidence of mitral valve stenosis. Tricuspid Valve: The tricuspid valve is  normal in structure. Tricuspid valve regurgitation is trivial. Aortic Valve: The aortic valve is tricuspid. Aortic valve regurgitation is trivial. Aortic regurgitation PHT measures 515 msec. No aortic stenosis is present. Pulmonic Valve: The pulmonic valve was normal in structure. Pulmonic valve regurgitation is trivial. Aorta: The aortic root is normal in size and structure. Venous: The inferior vena cava is normal in size with greater than 50% respiratory variability, suggesting right atrial pressure of 3 mmHg. IAS/Shunts: No atrial level shunt detected by color flow Doppler.  LEFT VENTRICLE PLAX 2D LVIDd:         3.72 cm  Diastology LVIDs:         2.32 cm  LV e' medial:    3.59 cm/s LV PW:         1.17 cm  LV E/e' medial:  29.0 LV IVS:        1.02 cm  LV e' lateral:   4.47 cm/s LVOT diam:     1.80 cm  LV E/e' lateral: 23.3 LV SV:         84 LV SV Index:   59 LVOT Area:     2.54 cm  RIGHT VENTRICLE            IVC RV Basal diam:  2.33 cm    IVC diam: 1.56 cm RV S prime:     9.99 cm/s TAPSE (M-mode): 2.0 cm LEFT ATRIUM             Index       RIGHT ATRIUM           Index LA diam:        3.80 cm 2.65 cm/m  RA Area:     10.20 cm LA Vol (A2C):   78.7 ml 54.97 ml/m RA Volume:   22.80 ml  15.92 ml/m LA Vol (A4C):   89.9 ml 62.79 ml/m LA Biplane Vol: 83.3 ml 58.18 ml/m  AORTIC VALVE LVOT Vmax:   141.00 cm/s LVOT Vmean:  90.700 cm/s LVOT VTI:    0.330 m AI PHT:  515 msec  AORTA Ao Root diam: 2.70 cm Ao Asc diam:  3.00 cm MITRAL VALVE                TRICUSPID VALVE MV Area (PHT): 1.94 cm     TR Peak grad:   18.7 mmHg MV Decel Time: 391 msec     TR Vmax:        216.00 cm/s MV E velocity: 104.00 cm/s MV A velocity: 154.00 cm/s  SHUNTS MV E/A ratio:  0.68         Systemic VTI:  0.33 m                             Systemic Diam: 1.80 cm Mackenzie Peterson Electronically signed by Mackenzie Peterson Signature Date/Time: 11/27/2019/4:07:46 PM    Final    DG Hip Unilat W or Wo Pelvis 2-3 Views Right  Result Date:  11/28/2019 CLINICAL DATA:  Larey Seat.  Right hip pain. EXAM: DG HIP (WITH OR WITHOUT PELVIS) 2-3V RIGHT COMPARISON:  None. FINDINGS: There is a displaced intertrochanteric fracture of the right hip. Both hips are normally located.  No pelvic fractures are identified. IMPRESSION: Displaced intertrochanteric fracture of the right hip. Electronically Signed   By: Mackenzie Meyer M.D.   On: 11/28/2019 14:50   DG Femur Min 2 Views Right  Result Date: 11/28/2019 CLINICAL DATA:  Larey Seat.  Right hip pain. EXAM: RIGHT FEMUR 2 VIEWS COMPARISON:  None. FINDINGS: There is a displaced intertrochanteric fracture of the right hip. The right hip is normally located. No pelvic fractures are identified. IMPRESSION: Displaced intertrochanteric fracture of the right hip. Electronically Signed   By: Mackenzie Meyer M.D.   On: 11/28/2019 14:47    Review of Systems  HENT: Negative for ear discharge, ear pain, hearing loss and tinnitus.   Eyes: Negative for photophobia and pain.  Respiratory: Negative for cough and shortness of breath.   Cardiovascular: Negative for chest pain.  Gastrointestinal: Negative for abdominal pain, nausea and vomiting.  Genitourinary: Negative for dysuria, flank pain, frequency and urgency.  Musculoskeletal: Positive for arthralgias (Right hip). Negative for back pain, myalgias and neck pain.  Neurological: Negative for dizziness and headaches.  Hematological: Does not bruise/bleed easily.  Psychiatric/Behavioral: The patient is not nervous/anxious.    Blood pressure (!) 125/55, pulse 95, temperature 98.8 F (37.1 C), temperature source Temporal, resp. rate (!) 22, SpO2 98 %. Physical Exam Constitutional:      General: She is not in acute distress.    Appearance: She is well-developed. She is not diaphoretic.  HENT:     Head: Normocephalic and atraumatic.  Eyes:     General: No scleral icterus.       Right eye: No discharge.        Left eye: No discharge.     Conjunctiva/sclera: Conjunctivae  normal.  Cardiovascular:     Rate and Rhythm: Normal rate and regular rhythm.  Pulmonary:     Effort: Pulmonary effort is normal. No respiratory distress.  Musculoskeletal:     Cervical back: Normal range of motion.     Comments: RLE No traumatic wounds, ecchymosis, or rash  Mod TTP hip  No knee or ankle effusion  Knee stable to varus/ valgus and anterior/posterior stress  Sens DPN, SPN, TN intact  Motor EHL, ext, flex, evers 5/5  DP 1+, PT 1+, No significant edema  Skin:    General: Skin is warm and  dry.  Neurological:     Mental Status: She is alert.  Psychiatric:        Behavior: Behavior normal.     Assessment/Plan: Right hip fx -- Plan IMN today with Mackenzie Peterson. Please keep NPO. Multiple medical problems including dementia, mood disorder, hypothyroidism, hypertension and PVD -- Have asked medicine to admit, appreciate their help.    Freeman Caldron, PA-C Orthopedic Surgery [phone] 11/28/2019, 3:53 PM

## 2019-11-28 NOTE — Brief Op Note (Signed)
11/28/2019  7:57 PM  PATIENT:  Mackenzie Peterson  86 y.o. female  PRE-OPERATIVE DIAGNOSIS:  Right hip fx  POST-OPERATIVE DIAGNOSIS:  Same  PROCEDURE:  INTRAMEDULLARY (IM) NAIL INTERTROCHANTRIC  SURGEON:  Yolonda Kida, MD  ANESTHESIA:   General  COMPLICATIONS: None

## 2019-11-28 NOTE — ED Provider Notes (Signed)
[facility] EMERGENCY DEPARTMENT Provider Note   CSN: [id number] Arrival date & time: 11/28/19  1340     History Chief Complaint  Patient presents with  . Fall    Mackenzie Peterson is a 86 y.o. female.  The history is provided by the patient and a relative.  Hip Pain This is a new problem. The current episode started 1 to 2 hours ago. The problem occurs constantly. The problem has not changed since onset.Exacerbated by: movement. Nothing relieves the symptoms. She has tried nothing for the symptoms.       Past Medical History:  Diagnosis Date  . Alzheimer disease (HCC)   . Asthma   . Chronic bronchitis (HCC)   . Dysthymic disorder   . Emphysema   . Hyperlipidemia   . Hypertension   . hypothyroid   . PVD (peripheral vascular disease) [facility])     Patient Active Problem List   Diagnosis Date Noted  . Intertrochanteric fracture of right femur (HCC) 11/28/2019  . Closed right hip fracture, initial encounter (HCC) 11/28/2019  . Delirium 11/26/2019  . 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 Dementia (HCC) 01/06/2012  . Pneumonia 01/06/2012  . Leukocytosis 01/05/2012  . Abdominal pain 01/05/2012  . Hypothyroidism 10/26/2009  . Depression 10/26/2009  . Mild persistent chronic asthma without complication 10/26/2009  . SHORTNESS OF BREATH (SOB) 10/26/2009  . COUGH 10/26/2009    No past surgical history on file.   OB History   No obstetric history on file.     No family history on file.  Social History   Tobacco Use  . Smoking status: Never Smoker  . Smokeless tobacco: Never Used  Substance Use Topics  . Alcohol use: No  . Drug use: No    Home Medications Prior to Admission medications   Medication Sig Start Date End Date Taking? Authorizing Provider  acetaminophen (TYLENOL) 500 MG tablet Take 500-1,000 mg by  mouth every 6 (six) hours as needed for mild pain or headache.   Yes [provider]  albuterol (PROVENTIL) (2.5 MG/3ML) 0.083% nebulizer solution Take 2.5 mg by nebulization 3 (three) times daily as needed for wheezing or shortness of breath.    Yes [provider]  ALPRAZolam Prudy Feeler) 0.5 MG tablet Take 0.5 mg by mouth 3 (three) times daily as needed for anxiety.    Yes [provider]  aspirin 81 MG tablet Take 81 mg by mouth daily.   Yes [provider]  cetirizine (ZYRTEC) 10 MG tablet Take 10 mg by mouth at bedtime.   Yes [provider]  donepezil (ARICEPT) 10 MG tablet Take 10 mg by mouth at bedtime. 09/10/19  Yes [provider]  levothyroxine (SYNTHROID, LEVOTHROID) 25 MCG tablet Take 25 mcg by mouth daily before breakfast.    Yes [provider]  memantine (NAMENDA) 10 MG tablet Take 10 mg by mouth 2 (two) times daily.   Yes [provider]  Multiple Vitamins-Minerals (ONE-A-DAY PROACTIVE 65+) TABS Take 1 tablet by mouth daily with breakfast.   Yes [provider]  nitroGLYCERIN (NITROSTAT) 0.4 MG SL tablet Place 0.4 mg under the tongue every 5 (five) minutes as needed for chest pain.    Yes [provider]  omeprazole (PRILOSEC) 20 MG capsule TAKE 1 CAPSULE BY MOUTH 30 TO 60 MINUTES BEFORE THE FIRST AND LAST MEALS OF THE  DAY Patient taking differently: Take 20 mg by mouth 2 (two) times daily before a meal.    Yes Kalman Shanamaswamy, Murali, MD  pravastatin (PRAVACHOL) 20 MG tablet Take 20 mg by mouth at bedtime. 04/17/19  Yes [provider]  QUEtiapine (SEROQUEL) 50 MG tablet Take 50 mg by mouth 2 (two) times daily.   Yes [provider]  sertraline (ZOLOFT) 25 MG tablet Take 25 mg by mouth at bedtime. 04/20/19  Yes [provider]  solifenacin (VESICARE) 5 MG tablet Take 5 mg by mouth at bedtime.    Yes [provider]  traZODone (DESYREL) 50 MG tablet Take 50 mg by mouth at  bedtime.   Yes [provider]  OXYGEN Inhale 2 L/min into the lungs at bedtime.    [provider]    Allergies    Patient has no known allergies.  Review of Systems   Review of Systems  Unable to perform ROS: Dementia    Physical Exam Updated Vital Signs BP (!) 125/55   Pulse 95   Temp 98.8 F (37.1 C) (Temporal)   Resp (!) 22   SpO2 98%   Physical Exam Vitals and nursing note reviewed.  Constitutional:      General: She is not in acute distress.    Appearance: She is well-developed. She is not ill-appearing, toxic-appearing or diaphoretic.  HENT:     Head: Normocephalic. Laceration (to lateral R eyebrow) present.     Right Ear: External ear normal.     Left Ear: External ear normal.     Nose:     Right Nostril: No epistaxis or septal hematoma.     Left Nostril: No epistaxis or septal hematoma.     Mouth/Throat:     Mouth: No injury or lacerations.  Eyes:     Conjunctiva/sclera: Conjunctivae normal.     Pupils: Pupils are equal, round, and reactive to light.  Neck:     Comments: In C collar Cardiovascular:     Rate and Rhythm: Normal rate and regular rhythm.     Pulses:          Radial pulses are 2+ on the right side and 2+ on the left side.       Dorsalis pedis pulses are 2+ on the right side and 2+ on the left side.     Heart sounds: No murmur heard.   Pulmonary:     Effort: Pulmonary effort is normal. No respiratory distress.     Breath sounds: Normal breath sounds. No decreased breath sounds.  Abdominal:     Palpations: Abdomen is soft.     Tenderness: There is no abdominal tenderness.  Musculoskeletal:     Cervical back: Neck supple. No spinous process tenderness or muscular tenderness.     Right hip: Deformity and tenderness present. Decreased range of motion.     Right lower leg: No edema.     Left lower leg: No edema.     Comments: R hip with TTP, decreased ROM, external rotation, slight abduction, shortening.  Full range of motion  at bilateral ankles and toes with symmetric intact sensation and strength  Skin:    General: Skin is warm and dry.  Neurological:     Mental Status: She is alert.     ED Results / Procedures / Treatments   Labs (all labs ordered are listed, but only abnormal results are displayed) Labs Reviewed  BASIC METABOLIC PANEL - Abnormal; Notable for the following components:  Result Value   CO2 19 (*)    Glucose, Bld 118 (*)    Calcium 8.5 (*)    GFR, Estimated 55 (*)    All other components within normal limits  CBC WITH DIFFERENTIAL/PLATELET - Abnormal; Notable for the following components:   WBC 19.7 (*)    Neutro Abs 17.4 (*)    Monocytes Absolute 1.4 (*)    Abs Immature Granulocytes 0.08 (*)    All other components within normal limits  RESPIRATORY PANEL BY RT PCR (FLU A&B, COVID)  RESPIRATORY PANEL BY RT PCR (FLU A&B, COVID)  PROTIME-INR  TYPE AND SCREEN  ABO/RH    EKG None  Radiology DG Knee 2 Views Right  Result Date: 11/28/2019 CLINICAL DATA:  Larey Seat.  Right hip pain. EXAM: RIGHT KNEE - 1-2 VIEW COMPARISON:  None. FINDINGS: The right knee joint is maintained. No acute fracture is identified. IMPRESSION: No acute bony findings. Electronically Signed   By: Rudie Meyer M.D.   On: 11/28/2019 14:51   CT Head Wo Contrast  Result Date: 11/28/2019 CLINICAL DATA:  Neck trauma. Patient fell in was seen yesterday. Patient had another fall today. EXAM: CT HEAD WITHOUT CONTRAST CT CERVICAL SPINE WITHOUT CONTRAST TECHNIQUE: Multidetector CT imaging of the head and cervical spine was performed following the standard protocol without intravenous contrast. Multiplanar CT image reconstructions of the cervical spine were also generated. COMPARISON:  11/26/2019 FINDINGS: CT HEAD FINDINGS Brain: There is minimal periventricular white matter change. There is no intra or extra-axial fluid collection or mass lesion. The basilar cisterns and ventricles have a normal appearance. There is no CT  evidence for acute infarction or hemorrhage. Vascular: There is atherosclerotic calcification of the internal carotid arteries. No hyperdense vessels. Skull: Normal. Negative for fracture or focal lesion. Sinuses/Orbits: No acute finding. Other: None CT CERVICAL SPINE FINDINGS There is significant patient motion artifact. Alignment: There is trace, stable anterolisthesis of C3 on C4 and C7 on T1. Trace retrolisthesis of C5 on C6. Otherwise alignment is normal. Skull base and vertebrae: No acute fracture. No primary bone lesion or focal pathologic process. Soft tissues and spinal canal: No prevertebral fluid or swelling. No visible canal hematoma. Disc levels: Moderate disc height loss and uncovertebral spurring primarily at C4-5, C5-6, C6-7. Upper chest: Negative. Other: None IMPRESSION: 1. No evidence for acute intracranial abnormality. 2. Minimal periventricular white matter changes. 3. Patient motion artifact. 4. Moderate mid cervical degenerative changes. 5. No evidence for acute cervical spine abnormality. Electronically Signed   By: Norva Pavlov M.D.   On: 11/28/2019 14:36   CT ANGIO CHEST PE W OR WO CONTRAST  Result Date: 11/26/2019 CLINICAL DATA:  86 year old female with concern for pulmonary embolism. EXAM: CT ANGIOGRAPHY CHEST WITH CONTRAST TECHNIQUE: Multidetector CT imaging of the chest was performed using the standard protocol during bolus administration of intravenous contrast. Multiplanar CT image reconstructions and MIPs were obtained to evaluate the vascular anatomy. CONTRAST:  OMNIPAQUE IOHEXOL 300 MG/ML  SOLN COMPARISON:  Chest CT dated 04/30/2019. FINDINGS: Cardiovascular: There is no cardiomegaly or pericardial effusion. Coronary vascular calcification primarily involving the LAD. There is calcification of the mitral annulus. Moderate atherosclerotic calcification of the thoracic aorta. No aneurysmal dilatation or dissection. Mild dilatation of main pulmonary trunk suggestive of a  degree of pulmonary hypertension. Clinical correlation is recommended. No pulmonary artery embolus identified. Mediastinum/Nodes: There is no hilar or mediastinal adenopathy. Small right hilar calcified granuloma. The esophagus is grossly unremarkable. No mediastinal fluid collection. Lungs/Pleura: Extensive bilateral  pulmonary nodules as seen on the prior CT. No new consolidation. There is no pleural effusion pneumothorax. The central airways are patent. Upper Abdomen: Bilateral adrenal thickening/hyperplasia versus adenoma. Musculoskeletal: Osteopenia with degenerative changes of the spine. No acute osseous pathology. Review of the MIP images confirms the above findings. IMPRESSION: 1. No acute intrathoracic pathology. No CT evidence of pulmonary artery embolus. 2. Extensive bilateral pulmonary nodules as seen on the prior CT. 3. Aortic Atherosclerosis (ICD10-I70.0). Electronically Signed   By: Elgie Collard M.D.   On: 11/26/2019 23:07   CT Cervical Spine Wo Contrast  Result Date: 11/28/2019 CLINICAL DATA:  Neck trauma. Patient fell in was seen yesterday. Patient had another fall today. EXAM: CT HEAD WITHOUT CONTRAST CT CERVICAL SPINE WITHOUT CONTRAST TECHNIQUE: Multidetector CT imaging of the head and cervical spine was performed following the standard protocol without intravenous contrast. Multiplanar CT image reconstructions of the cervical spine were also generated. COMPARISON:  11/26/2019 FINDINGS: CT HEAD FINDINGS Brain: There is minimal periventricular white matter change. There is no intra or extra-axial fluid collection or mass lesion. The basilar cisterns and ventricles have a normal appearance. There is no CT evidence for acute infarction or hemorrhage. Vascular: There is atherosclerotic calcification of the internal carotid arteries. No hyperdense vessels. Skull: Normal. Negative for fracture or focal lesion. Sinuses/Orbits: No acute finding. Other: None CT CERVICAL SPINE FINDINGS There is  significant patient motion artifact. Alignment: There is trace, stable anterolisthesis of C3 on C4 and C7 on T1. Trace retrolisthesis of C5 on C6. Otherwise alignment is normal. Skull base and vertebrae: No acute fracture. No primary bone lesion or focal pathologic process. Soft tissues and spinal canal: No prevertebral fluid or swelling. No visible canal hematoma. Disc levels: Moderate disc height loss and uncovertebral spurring primarily at C4-5, C5-6, C6-7. Upper chest: Negative. Other: None IMPRESSION: 1. No evidence for acute intracranial abnormality. 2. Minimal periventricular white matter changes. 3. Patient motion artifact. 4. Moderate mid cervical degenerative changes. 5. No evidence for acute cervical spine abnormality. Electronically Signed   By: Norva Pavlov M.D.   On: 11/28/2019 14:36   MR BRAIN WO CONTRAST  Result Date: 11/27/2019 CLINICAL DATA:  TIA.  Delirium. EXAM: MRI HEAD WITHOUT CONTRAST TECHNIQUE: Multiplanar, multiecho pulse sequences of the brain and surrounding structures were obtained without intravenous contrast. COMPARISON:  CT head 11/26/2019 FINDINGS: Brain: Ventricle size and cerebral volume normal for age. Mild hyperintensity in the periventricular and deep white matter bilaterally. Two areas of chronic microhemorrhage in the parietal lobes bilaterally. Negative for mass or edema. Negative for acute infarct. Vascular: Normal arterial flow voids. Skull and upper cervical spine: Negative Sinuses/Orbits: Mild mucosal edema paranasal sinuses. Bilateral cataract extraction Other: None IMPRESSION: No acute abnormality Mild white matter changes, typical for age. Electronically Signed   By: Marlan Palau M.D.   On: 11/27/2019 13:34   EEG adult  Result Date: 11/27/2019 Charlsie Quest, MD     11/27/2019 10:13 AM Patient Name: Mackenzie Peterson MRN: [id number] Epilepsy Attending: Charlsie Quest Referring Physician/Provider: Dr. Candelaria Stagers Date: 11/27/2019 Duration: 23.40 mins  Patient history: 86 year old female with altered mental status.  EEG evaluate for seizures. Level of alertness: Awake, asleep AEDs during EEG study: None Technical aspects: This EEG study was done with scalp electrodes positioned according to the 10-20 International system of electrode placement. Electrical activity was acquired at a sampling rate of  and reviewed with a high frequency filter of  and a low frequency filter of . EEG data  were recorded continuously and digitally stored. Description: The posterior dominant rhythm consists of 8 Hz activity of moderate voltage (25-35 uV) seen predominantly in posterior head regions, symmetric and reactive to eye opening and eye closing. Sleep was characterized by vertex waves, sleep spindles (12 to 14 Hz), maximal frontocentral region.  EEG showed intermittent generalized 3 to 6 Hz theta-delta slowing.  Hyperventilation and photic stimulation were not performed.   ABNORMALITY -Intermittent slow, generalized IMPRESSION: This study is suggestive of mild diffuse encephalopathy, nonspecific etiology. No seizures or epileptiform discharges were seen throughout the recording. Charlsie Quest   ECHOCARDIOGRAM COMPLETE  Result Date: 11/27/2019    ECHOCARDIOGRAM REPORT   Patient Name:   Mackenzie Peterson Date of Exam: 11/27/2019 Medical Rec #:  [id number]     Height:       59.0 in Accession #:    [id number]    Weight:       110.2 lb Date of Birth:  1924/10/25     BSA:          1.432 m Patient Age:    89 years      BP:           124/52 mmHg Patient Gender: F             HR:           65 bpm. Exam Location:  Inpatient Procedure: 2D Echo, Cardiac Doppler and Color Doppler Indications:    Cerebral Vascular Accident [id number]  History:        Patient has no prior history of Echocardiogram examinations.                 Risk Factors:Hypertension and Dyslipidemia. Hypothyroidism.  Sonographer:    Elmarie Shiley Dance Referring Phys: [id number] EKTA V PATEL IMPRESSIONS  1. Left ventricular  ejection fraction, by estimation, is 65 to 70%. The left ventricle has hyperdynamic function. The left ventricle has no regional wall motion abnormalities. There is mild left ventricular hypertrophy. Left ventricular diastolic parameters are consistent with Grade I diastolic dysfunction (impaired relaxation).  2. Right ventricular systolic function is normal. The right ventricular size is normal. The estimated right ventricular systolic pressure is 21.7 mmHg.  3. Left atrial size was severely dilated.  4. The mitral valve is degenerative. Mild mitral valve regurgitation. No evidence of mitral stenosis. Moderate mitral annular calcification.  5. The aortic valve is tricuspid. Aortic valve regurgitation is trivial. No aortic stenosis is present.  6. The inferior vena cava is normal in size with greater than 50% respiratory variability, suggesting right atrial pressure of 3 mmHg. FINDINGS  Left Ventricle: Left ventricular ejection fraction, by estimation, is 65 to 70%. The left ventricle has hyperdynamic function. The left ventricle has no regional wall motion abnormalities. The left ventricular internal cavity size was normal in size. There is mild left ventricular hypertrophy. Left ventricular diastolic parameters are consistent with Grade I diastolic dysfunction (impaired relaxation). Right Ventricle: The right ventricular size is normal. No increase in right ventricular wall thickness. Right ventricular systolic function is normal. The tricuspid regurgitant velocity is 2.16 m/s, and with an assumed right atrial pressure of 3 mmHg, the estimated right ventricular systolic pressure is 21.7 mmHg. Left Atrium: Left atrial size was severely dilated. Right Atrium: Right atrial size was normal in size. Pericardium: There is no evidence of pericardial effusion. Mitral Valve: The mitral valve is degenerative in appearance. Moderate mitral annular calcification. Mild mitral valve regurgitation. No evidence of mitral valve  stenosis. Tricuspid  Valve: The tricuspid valve is normal in structure. Tricuspid valve regurgitation is trivial. Aortic Valve: The aortic valve is tricuspid. Aortic valve regurgitation is trivial. Aortic regurgitation PHT measures 515 msec. No aortic stenosis is present. Pulmonic Valve: The pulmonic valve was normal in structure. Pulmonic valve regurgitation is trivial. Aorta: The aortic root is normal in size and structure. Venous: The inferior vena cava is normal in size with greater than 50% respiratory variability, suggesting right atrial pressure of 3 mmHg. IAS/Shunts: No atrial level shunt detected by color flow Doppler.  LEFT VENTRICLE PLAX 2D LVIDd:         3.72 cm  Diastology LVIDs:         2.32 cm  LV e' medial:    3.59 cm/s LV PW:         1.17 cm  LV E/e' medial:  29.0 LV IVS:        1.02 cm  LV e' lateral:   4.47 cm/s LVOT diam:     1.80 cm  LV E/e' lateral: 23.3 LV SV:         84 LV SV Index:   59 LVOT Area:     2.54 cm  RIGHT VENTRICLE            IVC RV Basal diam:  2.33 cm    IVC diam: 1.56 cm RV S prime:     9.99 cm/s TAPSE (M-mode): 2.0 cm LEFT ATRIUM             Index       RIGHT ATRIUM           Index LA diam:        3.80 cm 2.65 cm/m  RA Area:     10.20 cm LA Vol (A2C):   78.7 ml 54.97 ml/m RA Volume:   22.80 ml  15.92 ml/m LA Vol (A4C):   89.9 ml 62.79 ml/m LA Biplane Vol: 83.3 ml 58.18 ml/m  AORTIC VALVE LVOT Vmax:   141.00 cm/s LVOT Vmean:  90.700 cm/s LVOT VTI:    0.330 m AI PHT:      515 msec  AORTA Ao Root diam: 2.70 cm Ao Asc diam:  3.00 cm MITRAL VALVE                TRICUSPID VALVE MV Area (PHT): 1.94 cm     TR Peak grad:   18.7 mmHg MV Decel Time: 391 msec     TR Vmax:        216.00 cm/s MV E velocity: 104.00 cm/s MV A velocity: 154.00 cm/s  SHUNTS MV E/A ratio:  0.68         Systemic VTI:  0.33 m                             Systemic Diam: 1.80 cm Marca Ancona MD Electronically signed by Marca Ancona MD Signature Date/Time: 11/27/2019/4:07:46 PM    Final    DG Hip Unilat W  or Wo Pelvis 2-3 Views Right  Result Date: 11/28/2019 CLINICAL DATA:  Larey Seat.  Right hip pain. EXAM: DG HIP (WITH OR WITHOUT PELVIS) 2-3V RIGHT COMPARISON:  None. FINDINGS: There is a displaced intertrochanteric fracture of the right hip. Both hips are normally located.  No pelvic fractures are identified. IMPRESSION: Displaced intertrochanteric fracture of the right hip. Electronically Signed   By: Rudie Meyer M.D.   On: 11/28/2019 14:50   DG Femur Min  2 Views Right  Result Date: 11/28/2019 CLINICAL DATA:  Larey Seat.  Right hip pain. EXAM: RIGHT FEMUR 2 VIEWS COMPARISON:  None. FINDINGS: There is a displaced intertrochanteric fracture of the right hip. The right hip is normally located. No pelvic fractures are identified. IMPRESSION: Displaced intertrochanteric fracture of the right hip. Electronically Signed   By: Rudie Meyer M.D.   On: 11/28/2019 14:47    Procedures .Marland KitchenLaceration Repair  Date/Time: 11/28/2019 3:26 PM Performed by: Loletha Carrow, MD Authorized by: Blane Ohara, MD   Consent:    Consent obtained:  Emergent situation and verbal   Consent given by: son.   Risks discussed:  Pain, poor cosmetic result and poor wound healing Laceration details:    Location:  Face   Face location:  R eyebrow   Length (cm):  1 Repair type:    Repair type:  Simple Pre-procedure details:    Preparation:  Patient was prepped and draped in usual sterile fashion Treatment:    Area cleansed with:  Saline   Amount of cleaning:  Standard   Irrigation solution:  Sterile saline   Irrigation method:  Syringe Skin repair:    Repair method:  Tissue adhesive Post-procedure details:    Patient tolerance of procedure:  Tolerated well, no immediate complications   (including critical care time)  Medications Ordered in ED Medications  chlorhexidine (HIBICLENS) 4 % liquid 4 application (has no administration in time range)  povidone-iodine 10 % swab 2 application (has no administration in time  range)  acetaminophen (TYLENOL) tablet 500-1,000 mg (has no administration in time range)  albuterol (PROVENTIL) (2.5 MG/3ML) 0.083% nebulizer solution 2.5 mg (has no administration in time range)  ALPRAZolam (XANAX) tablet 0.5 mg (has no administration in time range)  loratadine (CLARITIN) tablet 10 mg (has no administration in time range)  donepezil (ARICEPT) tablet 10 mg (has no administration in time range)  levothyroxine (SYNTHROID) tablet 25 mcg (has no administration in time range)  memantine (NAMENDA) tablet 10 mg (has no administration in time range)  nitroGLYCERIN (NITROSTAT) SL tablet 0.4 mg (has no administration in time range)  One-A-Day Proactive 65+ TABS 1 tablet (has no administration in time range)  traZODone (DESYREL) tablet 50 mg (has no administration in time range)  darifenacin (ENABLEX) 24 hr tablet 7.5 mg (has no administration in time range)  sertraline (ZOLOFT) tablet 25 mg (has no administration in time range)  QUEtiapine (SEROQUEL) tablet 50 mg (has no administration in time range)  pantoprazole (PROTONIX) EC tablet 40 mg (has no administration in time range)  HYDROcodone-acetaminophen (NORCO/VICODIN) 5-325 MG per tablet 1-2 tablet (has no administration in time range)  morphine 2 MG/ML injection 0.5 mg (has no administration in time range)  HYDROmorphone (DILAUDID) tablet 1 mg (has no administration in time range)  fentaNYL (SUBLIMAZE) injection 25 mcg (25 mcg Intravenous Given 11/28/19 1504)  fentaNYL (SUBLIMAZE) injection 50 mcg (50 mcg Intravenous Given 11/28/19 1611)    ED Course  I have reviewed the triage vital signs and the nursing notes.  Pertinent labs & imaging results that were available during my care of the patient were reviewed by me and considered in my medical decision making (see chart for details).    MDM Rules/Calculators/A&P                          The patient is a 86yo female, PMH dementia who presents to the ED via EMS for fall.  On  my initial  evaluation, the patient is  hemodynamically stable, afebrile, nontoxic-appearing. Physical exam remarkable for laceration to R eyebrow, tenderness and deformity to R hip.  Differentials considered include ICH, C spine fracture, hip fracture. Patient provided IV fentanyl for pain. CT head and C spine unremarkable. X-rays remarkable for intertrochanteric fracture. Consulted orthopedic surgery, and closed lac with dermabond after shared decision making conversation with son. Ortho requested admission to medicine. Conveyed findings to son at bedside, who voiced understanding.   Consulted hospitalist for admission. Transfer of care to Dr. Nunzio Cory at 1500. Plan is patient is pending admission. Patient transferred in stable condition.   The care of this patient was overseen by Dr. Jodi Mourning, who agreed with evaluation and plan of care.   Final Clinical Impression(s) / ED Diagnoses Final diagnoses:  Closed displaced intertrochanteric fracture of right femur, initial encounter [facility])  Facial laceration, initial encounter    Rx / DC Orders ED Discharge Orders    None       Loletha Carrow, MD 11/28/19 1740    Blane Ohara, MD 12/03/19 [phone]

## 2019-11-28 NOTE — ED Provider Notes (Signed)
Transfer of Care Note I assumed care of Mackenzie Peterson on 11/28/2019   Care of the patient was assumed from Dr. Madaline Guthrie at 3 pm; see this physician's note for complete history of present illness, review of systems, and physical exam.    Briefly, Mackenzie Peterson is a 86 y.o. female who:  Presented for evaluation after unwitnessed fall at home today  Pt dc from hospital yesterday, admitted for AMS  PMHx dementia, mood disorder, hypothyroidism, hypertension and PVD   On exam, R leg shortening and outward rotation   CT head, c-spine without acute traumatic injury   Xray R femur shows Displaced intertrochanteric fracture of the right hip  Ortho consulted   The plan includes:  Follow up with ortho   ### Reassessment: I personally reassessed the patient:  Vital Signs:  The most current vitals were  Vitals:   11/28/19 2130 11/28/19 2203  BP:  (!) 106/45  Pulse: (!) 108 99  Resp: (!) 21 18  Temp:  97.8 F (36.6 C)  SpO2: 100% 99%    Hemodynamics:  The patient is hemodynamically stable. Mental Status:  The patient is awake  Additional MDM:  Ortho recommending medicine admission for further management.   Dispo:  Admitted to hospitalist    Brantley Fling, MD 11/28/19 8099    Benjiman Core, MD 11/29/19 0001

## 2019-11-28 NOTE — Anesthesia Preprocedure Evaluation (Addendum)
Anesthesia Evaluation  Patient identified by MRN, date of birth, ID band Patient awake    Reviewed: Allergy & Precautions, NPO status , Patient's Chart, lab work & pertinent test results  Airway Mallampati: I  TM Distance: >3 FB Neck ROM: Full    Dental   Pulmonary asthma , COPD,  COPD inhaler,    Pulmonary exam normal        Cardiovascular hypertension, Pt. on medications + Peripheral Vascular Disease  Normal cardiovascular exam  ECG: rate 96. Sinus rhythm Borderline short PR interval Incomplete RBBB and LAFB  ECHO: 1. Left ventricular ejection fraction, by estimation, is 65 to 70%. The left ventricle has hyperdynamic function. The left ventricle has no regional wall motion abnormalities. There is mild left ventricular hypertrophy. Left ventricular diastolic parameters are consistent with Grade I diastolic dysfunction (impaired relaxation). 2. Right ventricular systolic function is normal. The right ventricular size is normal. The estimated right ventricular systolic pressure is 21.7 mmHg. 3. Left atrial size was severely dilated. 4. The mitral valve is degenerative. Mild mitral valve regurgitation. No evidence of mitral stenosis. Moderate mitral annular calcification. 5. The aortic valve is tricuspid. Aortic valve regurgitation is trivial. No aortic stenosis is present. 6. The inferior vena cava is normal in size with greater than 50% respiratory variability, suggesting right atrial pressure of 3 mmHg.   Neuro/Psych PSYCHIATRIC DISORDERS Depression Dementia negative neurological ROS     GI/Hepatic negative GI ROS, Neg liver ROS,   Endo/Other  Hypothyroidism   Renal/GU negative Renal ROS     Musculoskeletal negative musculoskeletal ROS (+)   Abdominal   Peds  Hematology HLD   Anesthesia Other Findings Right hip fx  Reproductive/Obstetrics                            Anesthesia  Physical Anesthesia Plan  ASA: III and emergent  Anesthesia Plan: General   Post-op Pain Management:    Induction: Intravenous  PONV Risk Score and Plan: 3 and Treatment may vary due to age or medical condition and Ondansetron  Airway Management Planned: Oral ETT  Additional Equipment:   Intra-op Plan:   Post-operative Plan: Extubation in OR  Informed Consent: I have reviewed the patients History and Physical, chart, labs and discussed the procedure including the risks, benefits and alternatives for the proposed anesthesia with the patient or authorized representative who has indicated his/her understanding and acceptance.       Plan Discussed with: CRNA and Surgeon  Anesthesia Plan Comments:         Anesthesia Quick Evaluation

## 2019-11-28 NOTE — H&P (Signed)
History and Physical    Mackenzie Peterson JSE:[id number] DOB: 02-02-1924 DOA: 11/28/2019  PCP: [name], MD    Patient coming from: Home  Chief Complaint:  Fall/right hip fracture   HPI: Mackenzie Peterson is a 86 y.o. female with medical history significant of alzheimer's, htn. Hyperlipidemia,hypothyroid, PVD seen in ed for Fall and rt hip fracture. Patient discharged yesterday for delirium admission to rehab facility.  Since which patient was 5 after an unwitnessed fall and patient was not able to get up.  Managing showed that patient has sustained a right intertrochanteric fracture and orthopedics was consulted with plans for surgery tomorrow morning. Son at bedside is crying and sad that mom is going thru so much.  ED Course:  Blood pressure (!) 125/55, pulse 95, temperature 98.8 F (37.1 C), temperature source Temporal, resp. rate (!) 22, SpO2 98 %. Viltas are stable.BMp is pending .  Review of Systems: As per HPI otherwise all systems reviewed and negative.  Past Medical History:  Diagnosis Date  . Alzheimer disease (HCC)   . Asthma   . Chronic bronchitis (HCC)   . Dysthymic disorder   . Emphysema   . Hyperlipidemia   . Hypertension   . hypothyroid   . PVD (peripheral vascular disease) (HCC)     No past surgical history on file.   reports that she has never smoked. She has never used smokeless tobacco. She reports that she does not drink alcohol and does not use drugs.  No Known Allergies  No family history on file.  Prior to Admission medications   Medication Sig Start Date End Date Taking? Authorizing Provider  acetaminophen (TYLENOL) 500 MG tablet Take 500-1,000 mg by mouth every 6 (six) hours as needed for mild pain or headache.   Yes [provider]  albuterol (PROVENTIL) (2.5 MG/3ML) 0.083% nebulizer solution Take 2.5 mg by nebulization 3 (three) times daily as needed for wheezing or shortness of breath.    Yes [provider]  ALPRAZolam  Prudy Feeler) 0.5 MG tablet Take 0.5 mg by mouth 3 (three) times daily as needed for anxiety.    Yes [provider]  aspirin 81 MG tablet Take 81 mg by mouth daily.   Yes [provider]  cetirizine (ZYRTEC) 10 MG tablet Take 10 mg by mouth at bedtime.   Yes [provider]  donepezil (ARICEPT) 10 MG tablet Take 10 mg by mouth at bedtime. 09/10/19  Yes [provider]  levothyroxine (SYNTHROID, LEVOTHROID) 25 MCG tablet Take 25 mcg by mouth daily before breakfast.    Yes [provider]  memantine (NAMENDA) 10 MG tablet Take 10 mg by mouth 2 (two) times daily.   Yes [provider]  Multiple Vitamins-Minerals (ONE-A-DAY PROACTIVE 65+) TABS Take 1 tablet by mouth daily with breakfast.   Yes [provider]  nitroGLYCERIN (NITROSTAT) 0.4 MG SL tablet Place 0.4 mg under the tongue every 5 (five) minutes as needed for chest pain.    Yes [provider]  omeprazole (PRILOSEC) 20 MG capsule TAKE 1 CAPSULE BY MOUTH 30 TO 60 MINUTES BEFORE THE FIRST AND LAST MEALS OF THE DAY Patient taking differently: Take 20 mg by mouth 2 (two) times daily before a meal.    Yes Ramaswamy, Murali, MD  pravastatin (PRAVACHOL) 20 MG tablet Take 20 mg by mouth at bedtime. 04/17/19  Yes [provider]  QUEtiapine (SEROQUEL) 50 MG tablet Take 50 mg by mouth 2 (two) times daily.   Yes [provider]  sertraline (ZOLOFT) 25 MG tablet Take 25 mg by mouth at bedtime. 04/20/19  Yes [provider]  solifenacin (VESICARE) 5 MG tablet Take 5 mg by mouth at bedtime.    Yes [provider]  traZODone (DESYREL) 50 MG tablet Take 50 mg by mouth at bedtime.   Yes [provider]  OXYGEN Inhale 2 L/min into the lungs at bedtime.    [provider]    Physical Exam: Vitals:   11/28/19 1345 11/28/19 1400 11/28/19 1415 11/28/19 1445  BP: (!) 97/52 (!) 121/45 (!) 106/59 (!) 125/55  Pulse: 95 96 92 95  Resp: 20 (!) 21 (!)  21 (!) 22  Temp:      TempSrc:      SpO2: 96% 95% 97% 98%    Constitutional: NAD, calm, comfortable Vitals:   11/28/19 1345 11/28/19 1400 11/28/19 1415 11/28/19 1445  BP: (!) 97/52 (!) 121/45 (!) 106/59 (!) 125/55  Pulse: 95 96 92 95  Resp: 20 (!) 21 (!) 21 (!) 22  Temp:      TempSrc:      SpO2: 96% 95% 97% 98%   Eyes: PERRL, EOMI lids and conjunctivae normal ENMT: Mucous membranes are moist. Posterior pharynx clear of any exudate or lesions.Normal dentition.  Neck: normal, supple, no masses, no thyromegaly, no carotid bruit  Respiratory: clear to auscultation bilaterally with basilar  wheezing, no crackles. Normal respiratory effort. No accessory muscle use.  Cardiovascular: Regular rate and rhythm, no murmurs / rubs / gallops. No extremity edema. 2+ pedal pulses. No carotid bruits.  Abdomen: no tenderness, no masses palpated. No hepatosplenomegaly. Bowel sounds positive.  Musculoskeletal: no clubbing / cyanosis. No joint deformity upper and lower extremities. Pt moving all four ext , no contractures. Normal muscle tone.  Skin: no rashes, lesions, ulcers. No induration Neurologic: CN 2-12 grossly intact. Sensation intact, rt leg unable to assess rom due to pain.  Psychiatric: Normal judgment and insight. Alert and oriented x 3. Normal mood.   Labs on Admission: I have personally reviewed following labs and imaging studies  CBC: Recent Labs  Lab 11/26/19 1544 11/26/19 1829 11/28/19 1543  WBC 10.5 9.7 19.7*  NEUTROABS 8.1*  --  17.4*  HGB 15.7* 15.9* 13.7  HCT 47.9* 47.6* 42.0  MCV 92.5 91.5 94.2  PLT 225 242 243   Basic Metabolic Panel: Recent Labs  Lab 11/26/19 1544 11/26/19 1829 11/28/19 1543  NA 139  --  142  K 4.0  --  3.7  CL 104  --  109  CO2 23  --  19*  GLUCOSE 95  --  118*  BUN 20  --  20  CREATININE 0.82 0.84 0.96  CALCIUM 8.6*  --  8.5*   GFR: CrCl cannot be calculated (Unknown ideal weight.). Liver Function Tests: Recent Labs  Lab  11/26/19 1544  AST 32  ALT 24  ALKPHOS 78  BILITOT 1.0  PROT 6.6  ALBUMIN 3.5   No results for input(s): LIPASE, AMYLASE in the last 168 hours. Recent Labs  Lab 11/27/19 1055  AMMONIA 44*   Coagulation Profile: Recent Labs  Lab 11/28/19 1543  INR 1.2   Cardiac Enzymes: No results for input(s): CKTOTAL, CKMB, CKMBINDEX, TROPONINI in the last 168 hours. BNP (last 3 results) No results for input(s): PROBNP in the last 8760 hours. HbA1C: No results for input(s): HGBA1C in the last 72 hours. CBG: Recent Labs  Lab 11/26/19 1829  GLUCAP 94   Lipid Profile: No  results for input(s): CHOL, HDL, LDLCALC, TRIG, CHOLHDL, LDLDIRECT in the last 72 hours. Thyroid Function Tests: Recent Labs    11/27/19 0752  TSH 2.119   Anemia Panel: Recent Labs    11/27/19 0752  VITAMINB12 792   Urine analysis:    Component Value Date/Time   COLORURINE YELLOW 11/26/2019 1322   APPEARANCEUR CLEAR 11/26/2019 1322   LABSPEC >1.030 (H) 11/26/2019 1322   PHURINE 5.5 11/26/2019 1322   GLUCOSEU NEGATIVE 11/26/2019 1322   HGBUR SMALL (A) 11/26/2019 1322   BILIRUBINUR NEGATIVE 11/26/2019 1322   KETONESUR NEGATIVE 11/26/2019 1322   PROTEINUR NEGATIVE 11/26/2019 1322   UROBILINOGEN 0.2 06/17/2012 1645   NITRITE NEGATIVE 11/26/2019 1322   LEUKOCYTESUR TRACE (A) 11/26/2019 1322   No intake or output data in the 24 hours ending 11/28/19 1637 Lab Results  Component Value Date   CREATININE 0.96 11/28/2019   CREATININE 0.84 11/26/2019   CREATININE 0.82 11/26/2019    COVID-19 Labs  Recent Labs    11/26/19 1829  DDIMER 1.03*    Lab Results  Component Value Date   SARSCOV2NAA NEGATIVE 11/26/2019    Radiological Exams on Admission: DG Knee 2 Views Right  Result Date: 11/28/2019 CLINICAL DATA:  Larey Seat.  Right hip pain. EXAM: RIGHT KNEE - 1-2 VIEW COMPARISON:  None. FINDINGS: The right knee joint is maintained. No acute fracture is identified. IMPRESSION: No acute bony findings.  Electronically Signed   By: Rudie Meyer M.D.   On: 11/28/2019 14:51   CT Head Wo Contrast  Result Date: 11/28/2019 CLINICAL DATA:  Neck trauma. Patient fell in was seen yesterday. Patient had another fall today. EXAM: CT HEAD WITHOUT CONTRAST CT CERVICAL SPINE WITHOUT CONTRAST TECHNIQUE: Multidetector CT imaging of the head and cervical spine was performed following the standard protocol without intravenous contrast. Multiplanar CT image reconstructions of the cervical spine were also generated. COMPARISON:  11/26/2019 FINDINGS: CT HEAD FINDINGS Brain: There is minimal periventricular white matter change. There is no intra or extra-axial fluid collection or mass lesion. The basilar cisterns and ventricles have a normal appearance. There is no CT evidence for acute infarction or hemorrhage. Vascular: There is atherosclerotic calcification of the internal carotid arteries. No hyperdense vessels. Skull: Normal. Negative for fracture or focal lesion. Sinuses/Orbits: No acute finding. Other: None CT CERVICAL SPINE FINDINGS There is significant patient motion artifact. Alignment: There is trace, stable anterolisthesis of C3 on C4 and C7 on T1. Trace retrolisthesis of C5 on C6. Otherwise alignment is normal. Skull base and vertebrae: No acute fracture. No primary bone lesion or focal pathologic process. Soft tissues and spinal canal: No prevertebral fluid or swelling. No visible canal hematoma. Disc levels: Moderate disc height loss and uncovertebral spurring primarily at C4-5, C5-6, C6-7. Upper chest: Negative. Other: None IMPRESSION: 1. No evidence for acute intracranial abnormality. 2. Minimal periventricular white matter changes. 3. Patient motion artifact. 4. Moderate mid cervical degenerative changes. 5. No evidence for acute cervical spine abnormality. Electronically Signed   By: Norva Pavlov M.D.   On: 11/28/2019 14:36   CT ANGIO CHEST PE W OR WO CONTRAST  Result Date: 11/26/2019 CLINICAL DATA:   86 year old female with concern for pulmonary embolism. EXAM: CT ANGIOGRAPHY CHEST WITH CONTRAST TECHNIQUE: Multidetector CT imaging of the chest was performed using the standard protocol during bolus administration of intravenous contrast. Multiplanar CT image reconstructions and MIPs were obtained to evaluate the vascular anatomy. CONTRAST:  OMNIPAQUE IOHEXOL 300 MG/ML  SOLN COMPARISON:  Chest CT dated 04/30/2019.  FINDINGS: Cardiovascular: There is no cardiomegaly or pericardial effusion. Coronary vascular calcification primarily involving the LAD. There is calcification of the mitral annulus. Moderate atherosclerotic calcification of the thoracic aorta. No aneurysmal dilatation or dissection. Mild dilatation of main pulmonary trunk suggestive of a degree of pulmonary hypertension. Clinical correlation is recommended. No pulmonary artery embolus identified. Mediastinum/Nodes: There is no hilar or mediastinal adenopathy. Small right hilar calcified granuloma. The esophagus is grossly unremarkable. No mediastinal fluid collection. Lungs/Pleura: Extensive bilateral pulmonary nodules as seen on the prior CT. No new consolidation. There is no pleural effusion pneumothorax. The central airways are patent. Upper Abdomen: Bilateral adrenal thickening/hyperplasia versus adenoma. Musculoskeletal: Osteopenia with degenerative changes of the spine. No acute osseous pathology. Review of the MIP images confirms the above findings. IMPRESSION: 1. No acute intrathoracic pathology. No CT evidence of pulmonary artery embolus. 2. Extensive bilateral pulmonary nodules as seen on the prior CT. 3. Aortic Atherosclerosis (ICD10-I70.0). Electronically Signed   By: Elgie CollardArash  Radparvar M.D.   On: 11/26/2019 23:07   CT Cervical Spine Wo Contrast  Result Date: 11/28/2019 CLINICAL DATA:  Neck trauma. Patient fell in was seen yesterday. Patient had another fall today. EXAM: CT HEAD WITHOUT CONTRAST CT CERVICAL SPINE WITHOUT CONTRAST  TECHNIQUE: Multidetector CT imaging of the head and cervical spine was performed following the standard protocol without intravenous contrast. Multiplanar CT image reconstructions of the cervical spine were also generated. COMPARISON:  11/26/2019 FINDINGS: CT HEAD FINDINGS Brain: There is minimal periventricular white matter change. There is no intra or extra-axial fluid collection or mass lesion. The basilar cisterns and ventricles have a normal appearance. There is no CT evidence for acute infarction or hemorrhage. Vascular: There is atherosclerotic calcification of the internal carotid arteries. No hyperdense vessels. Skull: Normal. Negative for fracture or focal lesion. Sinuses/Orbits: No acute finding. Other: None CT CERVICAL SPINE FINDINGS There is significant patient motion artifact. Alignment: There is trace, stable anterolisthesis of C3 on C4 and C7 on T1. Trace retrolisthesis of C5 on C6. Otherwise alignment is normal. Skull base and vertebrae: No acute fracture. No primary bone lesion or focal pathologic process. Soft tissues and spinal canal: No prevertebral fluid or swelling. No visible canal hematoma. Disc levels: Moderate disc height loss and uncovertebral spurring primarily at C4-5, C5-6, C6-7. Upper chest: Negative. Other: None IMPRESSION: 1. No evidence for acute intracranial abnormality. 2. Minimal periventricular white matter changes. 3. Patient motion artifact. 4. Moderate mid cervical degenerative changes. 5. No evidence for acute cervical spine abnormality. Electronically Signed   By: Norva PavlovElizabeth  Brown M.D.   On: 11/28/2019 14:36   MR BRAIN WO CONTRAST  Result Date: 11/27/2019 CLINICAL DATA:  TIA.  Delirium. EXAM: MRI HEAD WITHOUT CONTRAST TECHNIQUE: Multiplanar, multiecho pulse sequences of the brain and surrounding structures were obtained without intravenous contrast. COMPARISON:  CT head 11/26/2019 FINDINGS: Brain: Ventricle size and cerebral volume normal for age. Mild hyperintensity  in the periventricular and deep white matter bilaterally. Two areas of chronic microhemorrhage in the parietal lobes bilaterally. Negative for mass or edema. Negative for acute infarct. Vascular: Normal arterial flow voids. Skull and upper cervical spine: Negative Sinuses/Orbits: Mild mucosal edema paranasal sinuses. Bilateral cataract extraction Other: None IMPRESSION: No acute abnormality Mild white matter changes, typical for age. Electronically Signed   By: Marlan Palauharles  Clark M.D.   On: 11/27/2019 13:34   EEG adult  Result Date: 11/27/2019 Charlsie QuestYadav, Priyanka O, MD     11/27/2019 10:13 AM Patient Name: Daphine Deutschermerita Paulhus MRN: [id number] Epilepsy Attending: Kristopher OppenheimPriyanka O  Melynda Ripple Referring Physician/Provider: Dr. Candelaria Stagers Date: 11/27/2019 Duration: 23.40 mins Patient history: 86 year old female with altered mental status.  EEG evaluate for seizures. Level of alertness: Awake, asleep AEDs during EEG study: None Technical aspects: This EEG study was done with scalp electrodes positioned according to the 10-20 International system of electrode placement. Electrical activity was acquired at a sampling rate of 500Hz  and reviewed with a high frequency filter of 70Hz  and a low frequency filter of 1Hz . EEG data were recorded continuously and digitally stored. Description: The posterior dominant rhythm consists of 8 Hz activity of moderate voltage (25-35 uV) seen predominantly in posterior head regions, symmetric and reactive to eye opening and eye closing. Sleep was characterized by vertex waves, sleep spindles (12 to 14 Hz), maximal frontocentral region.  EEG showed intermittent generalized 3 to 6 Hz theta-delta slowing.  Hyperventilation and photic stimulation were not performed.   ABNORMALITY -Intermittent slow, generalized IMPRESSION: This study is suggestive of mild diffuse encephalopathy, nonspecific etiology. No seizures or epileptiform discharges were seen throughout the recording.   ECHOCARDIOGRAM  COMPLETE  Result Date: 11/27/2019    ECHOCARDIOGRAM REPORT   Patient Name:   DENIZ ESKRIDGE Date of Exam: 11/27/2019 Medical Rec #:  11/29/2019     Height:       59.0 in Accession #:    [name]    Weight:       110.2 lb Date of Birth:  1924-08-31     BSA:          1.432 m Patient Age:    89 years      BP:           124/52 mmHg Patient Gender: F             HR:           65 bpm. Exam Location:  Inpatient Procedure: 2D Echo, Cardiac Doppler and Color Doppler Indications:    Cerebral Vascular Accident [id number]  History:        Patient has no prior history of Echocardiogram examinations.                 Risk Factors:Hypertension and Dyslipidemia. Hypothyroidism.  Sonographer:    [id number] Dance Referring Phys: 10/24/1924 Deshon Koslowski V Tildon Silveria IMPRESSIONS  1. Left ventricular ejection fraction, by estimation, is 65 to 70%. The left ventricle has hyperdynamic function. The left ventricle has no regional wall motion abnormalities. There is mild left ventricular hypertrophy. Left ventricular diastolic parameters are consistent with Grade I diastolic dysfunction (impaired relaxation).  2. Right ventricular systolic function is normal. The right ventricular size is normal. The estimated right ventricular systolic pressure is 21.7 mmHg.  3. Left atrial size was severely dilated.  4. The mitral valve is degenerative. Mild mitral valve regurgitation. No evidence of mitral stenosis. Moderate mitral annular calcification.  5. The aortic valve is tricuspid. Aortic valve regurgitation is trivial. No aortic stenosis is present.  6. The inferior vena cava is normal in size with greater than 50% respiratory variability, suggesting right atrial pressure of 3 mmHg. FINDINGS  Left Ventricle: Left ventricular ejection fraction, by estimation, is 65 to 70%. The left ventricle has hyperdynamic function. The left ventricle has no regional wall motion abnormalities. The left ventricular internal cavity size was normal in size. There is mild left  ventricular hypertrophy. Left ventricular diastolic parameters are consistent with Grade I diastolic dysfunction (impaired relaxation). Right Ventricle: The right ventricular size is normal. No increase in right ventricular wall  thickness. Right ventricular systolic function is normal. The tricuspid regurgitant velocity is 2.16 m/s, and with an assumed right atrial pressure of 3 mmHg, the estimated right ventricular systolic pressure is 21.7 mmHg. Left Atrium: Left atrial size was severely dilated. Right Atrium: Right atrial size was normal in size. Pericardium: There is no evidence of pericardial effusion. Mitral Valve: The mitral valve is degenerative in appearance. Moderate mitral annular calcification. Mild mitral valve regurgitation. No evidence of mitral valve stenosis. Tricuspid Valve: The tricuspid valve is normal in structure. Tricuspid valve regurgitation is trivial. Aortic Valve: The aortic valve is tricuspid. Aortic valve regurgitation is trivial. Aortic regurgitation PHT measures 515 msec. No aortic stenosis is present. Pulmonic Valve: The pulmonic valve was normal in structure. Pulmonic valve regurgitation is trivial. Aorta: The aortic root is normal in size and structure. Venous: The inferior vena cava is normal in size with greater than 50% respiratory variability, suggesting right atrial pressure of 3 mmHg. IAS/Shunts: No atrial level shunt detected by color flow Doppler.  LEFT VENTRICLE PLAX 2D LVIDd:         3.72 cm  Diastology LVIDs:         2.32 cm  LV e' medial:    3.59 cm/s LV PW:         1.17 cm  LV E/e' medial:  29.0 LV IVS:        1.02 cm  LV e' lateral:   4.47 cm/s LVOT diam:     1.80 cm  LV E/e' lateral: 23.3 LV SV:         84 LV SV Index:   59 LVOT Area:     2.54 cm  RIGHT VENTRICLE            IVC RV Basal diam:  2.33 cm    IVC diam: 1.56 cm RV S prime:     9.99 cm/s TAPSE (M-mode): 2.0 cm LEFT ATRIUM             Index       RIGHT ATRIUM           Index LA diam:        3.80 cm 2.65  cm/m  RA Area:     10.20 cm LA Vol (A2C):   78.7 ml 54.97 ml/m RA Volume:   22.80 ml  15.92 ml/m LA Vol (A4C):   89.9 ml 62.79 ml/m LA Biplane Vol: 83.3 ml 58.18 ml/m  AORTIC VALVE LVOT Vmax:   141.00 cm/s LVOT Vmean:  90.700 cm/s LVOT VTI:    0.330 m AI PHT:      515 msec  AORTA Ao Root diam: 2.70 cm Ao Asc diam:  3.00 cm MITRAL VALVE                TRICUSPID VALVE MV Area (PHT): 1.94 cm     TR Peak grad:   18.7 mmHg MV Decel Time: 391 msec     TR Vmax:        216.00 cm/s MV E velocity: 104.00 cm/s MV A velocity: 154.00 cm/s  SHUNTS MV E/A ratio:  0.68         Systemic VTI:  0.33 m                             Systemic Diam: 1.80 cm Marca Ancona MD Electronically signed by Marca Ancona MD Signature Date/Time: 11/27/2019/4:07:46 PM    Final    DG  Hip Unilat W or Wo Pelvis 2-3 Views Right  Result Date: 11/28/2019 CLINICAL DATA:  Larey Seat.  Right hip pain. EXAM: DG HIP (WITH OR WITHOUT PELVIS) 2-3V RIGHT COMPARISON:  None. FINDINGS: There is a displaced intertrochanteric fracture of the right hip. Both hips are normally located.  No pelvic fractures are identified. IMPRESSION: Displaced intertrochanteric fracture of the right hip. Electronically Signed   By: Rudie Meyer M.D.   On: 11/28/2019 14:50   DG Femur Min 2 Views Right  Result Date: 11/28/2019 CLINICAL DATA:  Larey Seat.  Right hip pain. EXAM: RIGHT FEMUR 2 VIEWS COMPARISON:  None. FINDINGS: There is a displaced intertrochanteric fracture of the right hip. The right hip is normally located. No pelvic fractures are identified. IMPRESSION: Displaced intertrochanteric fracture of the right hip. Electronically Signed   By: Rudie Meyer M.D.   On: 11/28/2019 14:47    EKG: Independently reviewed.  Sinus 96 with Incomplete rbbb.  Assessment/Plan Principal Problem:   Intertrochanteric fracture of right femur [facility]): Per orthopedic and prn pain meds. DVT pro[hylasix after sx.    Hypothyroidism Cont replacement.    Mild persistent chronic asthma  without complication Cont home oxygen.     Delirium   Resolved and pt discharged.   DVT prophylaxis: Heparin  Code Status:  Full   Family Communication:  Son [name] [phone]   Disposition Plan:  SNF   Consults called:  Orthopedic.  Admission status: Inpatient.    Gertha Calkin MD [facility] Pager [phone] If 7PM-7AM, please contact night-coverage [email or web address] Password [facility] 11/28/2019, 4:37 PM

## 2019-11-28 NOTE — Anesthesia Procedure Notes (Signed)
Procedure Name: Intubation Date/Time: 11/28/2019 6:54 PM Performed by: Shirlyn Goltz, CRNA Pre-anesthesia Checklist: Patient identified, Emergency Drugs available, Suction available and Patient being monitored Patient Re-evaluated:Patient Re-evaluated prior to induction Oxygen Delivery Method: Circle system utilized Preoxygenation: Pre-oxygenation with 100% oxygen Induction Type: IV induction and Rapid sequence Laryngoscope Size: Mac and 3 Grade View: Grade I Tube type: Oral Tube size: 7.0 mm Number of attempts: 1 Airway Equipment and Method: Stylet Placement Confirmation: ETT inserted through vocal cords under direct vision,  positive ETCO2 and breath sounds checked- equal and bilateral Secured at: 20 cm Tube secured with: Tape Dental Injury: Teeth and Oropharynx as per pre-operative assessment

## 2019-11-28 NOTE — ED Triage Notes (Signed)
Pt arrives via ems from home after unwitnessed fall. Pt discharged yesterday from hospital. Pt normally ambulatory without assistive device. Pt with hematoma to R eye. Family reports pt was on blood thinner in hospital. R leg with shortening and outward rotation. Ccollar in place.

## 2019-11-28 NOTE — Op Note (Signed)
Date of Surgery: 11/28/2019  INDICATIONS: Mackenzie Peterson is a 86 y.o.-year-old female who sustained a right hip fracture. The risks and benefits of the procedure discussed with the patient and family prior to the procedure and all questions were answered; consent was obtained.  PREOPERATIVE DIAGNOSIS: right hip fracture   POSTOPERATIVE DIAGNOSIS: Same   PROCEDURE: Treatment of intertrochanteric, pertrochanteric, subtrochanteric fracture with intramedullary implant. CPT [phone]   SURGEON: Kathi Der. Aundria Rud, M.D.   Physician Assistant: Dion Saucier, PA-C  Assistant attestation: PA Mcclung was utilized throughout the procedure for positioning the patient, reduction of fracture, instrumentation of the femur, closure, and transfer back to PACU postoperatively.  ANESTHESIA: general   IV FLUIDS AND URINE: See anesthesia record   ESTIMATED BLOOD LOSS: 100 cc  IMPLANTS:   Synthes TFN a 10 mm x 285 mm 90 mm proximal compression screw 34 x 5 mm distal interlock  DRAINS: None.   COMPLICATIONS: None.   DESCRIPTION OF PROCEDURE: The patient was brought to the operating room and placed supine on the operating table. The patient's leg had been signed prior to the procedure. The patient had the anesthesia placed by the anesthesiologist. The prep verification and incision time-outs were performed to confirm that this was the correct patient, site, side and location. The patient had an SCD on the opposite lower extremity. The patient did receive antibiotics prior to the incision and was re-dosed during the procedure as needed at indicated intervals. The patient was positioned on the fracture table with the table in traction and internal rotation to reduce the hip. The well leg was placed in a scissor position and all bony prominences were well-padded. The patient had the lower extremity prepped and draped in the standard surgical fashion. The incision was made 4 finger breadths superior to the greater  trochanter. A guide pin was inserted into the tip of the greater trochanter under fluoroscopic guidance. An opening reamer was used to gain access to the femoral canal. The nail length was measured and inserted down the femoral canal to its proper depth. The appropriate version of insertion for the lag screw was found under fluoroscopy. A pin was inserted up the femoral neck through the jig. The length of the lag screw was then measured. The lag screw was inserted as near to center-center in the head as possible. The leg was taken out of traction, then the compression screw was used to compress across the fracture. Compression was visualized on serial xrays.   We next turned our attention to the distal interlocking screw.  This was placed through the drill guide of the nail inserter.  A small incision was made overlying the lateral thigh at the screw site, and a tonsil was used to disect down to bone.  A drill pass was made through the jig and across the nail through both cortices.  This was measured, and the appropriate screw was placed under hand power and found to have good bite.    The wound was copiously irrigated with saline and the subcutaneous layer closed with 2.0 vicryl and the skin was reapproximated with staples. The wounds were cleaned and dried a final time and a sterile dressing was placed. The hip was taken through a range of motion at the end of the case under fluoroscopic imaging to visualize the approach-withdraw phenomenon and confirm implant length in the head. The patient was then awakened from anesthesia and taken to the recovery room in stable condition. All counts were correct at  the end of the case.   POSTOPERATIVE PLAN: The patient will be weight bearing as tolerated and will return in 2 weeks for staple removal and the patient will receive DVT prophylaxis based on other medications, activity level, and risk ratio of bleeding to thrombosis.  Our recommendation will be for twice daily  81 mg aspirin x6 weeks.  She will return to the medicine service.   Mackenzie Rued, MD [facility] [place] [phone] 7:57 PM

## 2019-11-28 NOTE — Transfer of Care (Signed)
Immediate Anesthesia Transfer of Care Note  Patient: Mackenzie Peterson  Procedure(s) Performed: INTRAMEDULLARY (IM) NAIL INTERTROCHANTRIC (Right Hip)  Patient Location: PACU  Anesthesia Type:General  Level of Consciousness: awake and alert   Airway & Oxygen Therapy: Patient Spontanous Breathing and Patient connected to nasal cannula oxygen  Post-op Assessment: Report given to RN and Post -op Vital signs reviewed and stable  Post vital signs: Reviewed and stable  Last Vitals:  Vitals Value Taken Time  BP    Temp    Pulse 76 11/28/19 2019  Resp 16 11/28/19 2019  SpO2 100 % 11/28/19 2019  Vitals shown include unvalidated device data.  Last Pain:  Vitals:   11/28/19 1342  TempSrc: Temporal  PainSc: 9          Complications: No complications documented.

## 2019-11-29 DIAGNOSIS — G309 Alzheimer's disease, unspecified: Secondary | ICD-10-CM | POA: Diagnosis not present

## 2019-11-29 DIAGNOSIS — R410 Disorientation, unspecified: Secondary | ICD-10-CM | POA: Diagnosis not present

## 2019-11-29 DIAGNOSIS — F0280 Dementia in other diseases classified elsewhere without behavioral disturbance: Secondary | ICD-10-CM

## 2019-11-29 DIAGNOSIS — S72144A Nondisplaced intertrochanteric fracture of right femur, initial encounter for closed fracture: Secondary | ICD-10-CM | POA: Diagnosis not present

## 2019-11-29 DIAGNOSIS — S72141A Displaced intertrochanteric fracture of right femur, initial encounter for closed fracture: Secondary | ICD-10-CM | POA: Diagnosis not present

## 2019-11-29 DIAGNOSIS — E039 Hypothyroidism, unspecified: Secondary | ICD-10-CM | POA: Diagnosis not present

## 2019-11-29 LAB — CBC
HCT: 26.6 % — ABNORMAL LOW (ref 36.0–46.0)
Hemoglobin: 8.8 g/dL — ABNORMAL LOW (ref 12.0–15.0)
MCH: 31.5 pg (ref 26.0–34.0)
MCHC: 33.1 g/dL (ref 30.0–36.0)
MCV: 95.3 fL (ref 80.0–100.0)
Platelets: 148 10*3/uL — ABNORMAL LOW (ref 150–400)
RBC: 2.79 MIL/uL — ABNORMAL LOW (ref 3.87–5.11)
RDW: 13.8 % (ref 11.5–15.5)
WBC: 11.5 10*3/uL — ABNORMAL HIGH (ref 4.0–10.5)
nRBC: 0 % (ref 0.0–0.2)

## 2019-11-29 MED ORDER — LACTATED RINGERS IV BOLUS
1000.0000 mL | Freq: Once | INTRAVENOUS | Status: AC
  Administered 2019-11-29: 1000 mL via INTRAVENOUS

## 2019-11-29 MED ORDER — HYDROMORPHONE HCL 2 MG PO TABS
1.0000 mg | ORAL_TABLET | Freq: Four times a day (QID) | ORAL | Status: AC | PRN
  Administered 2019-11-29: 1 mg via ORAL
  Filled 2019-11-29: qty 1

## 2019-11-29 MED ORDER — TRAZODONE HCL 50 MG PO TABS
50.0000 mg | ORAL_TABLET | Freq: Every day | ORAL | Status: DC
  Administered 2019-11-29 – 2019-12-04 (×6): 50 mg via ORAL
  Filled 2019-11-29 (×7): qty 1

## 2019-11-29 MED ORDER — ENSURE ENLIVE PO LIQD
237.0000 mL | Freq: Two times a day (BID) | ORAL | Status: DC
  Administered 2019-11-29 – 2019-11-30 (×3): 237 mL via ORAL

## 2019-11-29 MED ORDER — LEVETIRACETAM 250 MG PO TABS
250.0000 mg | ORAL_TABLET | Freq: Two times a day (BID) | ORAL | Status: DC
  Administered 2019-11-29 – 2019-12-05 (×12): 250 mg via ORAL
  Filled 2019-11-29 (×13): qty 1

## 2019-11-29 MED ORDER — LACTATED RINGERS IV SOLN: INTRAVENOUS | Status: DC

## 2019-11-29 NOTE — Progress Notes (Signed)
Initial Nutrition Assessment  RD working remotely.  DOCUMENTATION CODES:   Not applicable  INTERVENTION:   -Ensure Enlive po BID, each supplement provides 350 kcal and 20 grams of protein -MVI with minerals daily  NUTRITION DIAGNOSIS:   Increased nutrient needs related to post-op healing as evidenced by estimated needs.  GOAL:   Patient will meet greater than or equal to 90% of their needs  MONITOR:   PO intake, Supplement acceptance, Labs, Weight trends, Skin, I & O's  REASON FOR ASSESSMENT:   Consult Hip fracture protocol  ASSESSMENT:   Mackenzie Peterson is a 86 y.o. female with medical history significant of alzheimer's, htn. Hyperlipidemia,hypothyroid, PVD seen in ed for Fall and rt hip fracture.  Pt admitted with rt intertochanteric femur fracture s/p fall.   10/29- s/p PROCEDURE: Treatment of intertrochanteric, pertrochanteric, subtrochanteric fracture with intramedullary implant  Reviewed I/O's: +2 L x 24 hours  UOP: 0 ml x 24 hours  Attempted to speak with pt via call to hospital room phone, however, unable to reach.   Pt currently on a regular diet; no meal completion records available to assess at this time. Pt with increased nutritional needs for post-operative healing and would benefit from addition of oral nutrition supplements.  Reviewed wt hx; pt has experienced a 7.4% wt loss over the past 6 months. While this is not significant for time frame, it is concerning given advanced age and multiple co-morbidities. Pt is at high risk for malnutrition, however, unable to identify at this time.   Medications reviewed and include colace and lactated ringers infusion @ 50 ml/hr.   Labs reviewed.   Diet Order:   Diet Order            Diet regular Room service appropriate? Yes; Fluid consistency: Thin  Diet effective now                 EDUCATION NEEDS:   No education needs have been identified at this time  Skin:  Skin Assessment: Skin Integrity  Issues: Skin Integrity Issues:: Incisions Incisions: rt hip  Last BM:  Unknown  Height:   Ht Readings from Last 1 Encounters:  11/28/19 5' (1.524 m)    Weight:   Wt Readings from Last 1 Encounters:  11/28/19 46.3 kg    Ideal Body Weight:  45.4 kg  BMI:  Body mass index is 19.93 kg/m.  Estimated Nutritional Needs:   Kcal:  1400-1600  Protein:  60-75 grams  Fluid:  > 1.4 L    Levada Schilling, RD, LDN, CDCES Registered Dietitian II Certified Diabetes Care and Education Specialist Please refer to [facility] for RD and/or RD on-call/weekend/after hours pager

## 2019-11-29 NOTE — Anesthesia Postprocedure Evaluation (Signed)
Anesthesia Post Note  Patient: Agricultural consultant  Procedure(s) Performed: INTRAMEDULLARY (IM) NAIL INTERTROCHANTRIC (Right Hip)     Patient location during evaluation: PACU Anesthesia Type: General Level of consciousness: awake and alert Pain management: pain level controlled Vital Signs Assessment: post-procedure vital signs reviewed and stable Respiratory status: spontaneous breathing, nonlabored ventilation, respiratory function stable and patient connected to nasal cannula oxygen Cardiovascular status: blood pressure returned to baseline and stable Postop Assessment: no apparent nausea or vomiting Anesthetic complications: no   No complications documented.  Last Vitals:  Vitals:   11/29/19 0100 11/29/19 0200  BP: (!) 107/47 (!) 134/51  Pulse: 87   Resp: 12 12  Temp:    SpO2: 99%     Last Pain:  Vitals:   11/29/19 0000  TempSrc: Oral  PainSc:                  Mercades Bajaj DAVID

## 2019-11-29 NOTE — Progress Notes (Signed)
Called by RN. Pt has BP that has decreased to 92/43 with MAP of 59. Not on IVF at this time. Had surgery to repair fractured hip this afternoon.  No hx of CHF. Given LR bolus of 1000 ml at 500 ml/hr then will continue LR at 50 ml/hr.  Monitor BP

## 2019-11-29 NOTE — Progress Notes (Addendum)
PROGRESS NOTE    Mackenzie Peterson   ZOX:[id number]  DOB: 06/18/1924  DOA: 11/28/2019 PCP: [name], MD   Brief Narrative:  Mackenzie Peterson  is a 86 y.o. mostly spanish speaking female with medical history significant of alzheimer's dementia, HTN, Hypothyroidism, PVD seen in ed for Fall and rt hip fracture. She had a hospital stay from 10/27-10/28 for confusion thought to be related to polypharmacy. She returned to baseline the following day and was discharged.  She fell at home (unwitnessed) and sustained a right hip fracture and a hematoma to the right eye.    Subjective: She has no pain and no other complaints. She thinks She is in [facility] but is aware that she is in a hospital.     Assessment & Plan:   Principal Problem:   Intertrochanteric fracture of right femur - s/p IM nail - on Lovenox for DVT prophylaxis - awaiting PT eval  Active Problems:  Dementia with sundowning (per son) - son states her current level of confusion (thinking she is in [place] is her baseline_ - on Aricept and Namenda - follow for sundowning daughter in law give her meds cont Seroquel BID, Trazodone and Zoloft QHS and follow - son states she takes Xanax occassionally at night- will hold this  Anemia and thrombocytopenia - Baseline Hb seems to range from 10-15 - Hb 13.7 on admission > 8.8 by 10/30 - possibly due to acute blood loss from fracture - follow and transfuse if persistently < 7  Change in mental status on 10/27 - suspected to be polypharmacy - I've spoken with the son who described episode of staring and repeating word and an episode of "stiffing of her body and gurgling" prior to the MRI and followed by "no speaking" for almost an entire day and by 11 PM the following day she suddenly was back to her baseline - MRI was negative- will ask for Neuro eval --    Hypothyroidism - Cont Synthroid    Mild persistent chronic asthma without complication - albuterol PRN - Not sure  why O2 at bedtime listed on med rec- follow pulse ox   GERD - no PPI  Hyperlipidemia - Pravachol on hold     Time spent in minutes: 35 DVT prophylaxis: enoxaparin (LOVENOX) injection 30 mg Start: 11/29/19 0800 SCDs Start: 11/28/19 2254 SCDs Start: 11/28/19 1632  Code Status: Full code Family Communication:  Disposition Plan:  Status is: Inpatient  Remains inpatient appropriate because:  requested neuro eval   Dispo: The patient is from: Home              Anticipated d/c is to: SNF              Anticipated d/c date is: > 3 days              Patient currently is medically stable to d/c.      Consultants:   Ortho Procedures:  Treatment of intertrochanteric, pertrochanteric, subtrochanteric fracture with intramedullary implant Antimicrobials:  Anti-infectives (From admission, onward)   Start     Dose/Rate Route Frequency Ordered Stop   11/29/19 0600  ceFAZolin (ANCEF) IVPB 2g/100 mL premix        2 g 200 mL/hr over 30 Minutes Intravenous On call to O.R. 11/28/19 1758 11/28/19 1900   11/29/19 0000  ceFAZolin (ANCEF) IVPB 2g/100 mL premix        2 g 200 mL/hr over 30 Minutes Intravenous Every 6 hours 11/28/19 2253 11/29/19 8119  11/28/19 1759  ceFAZolin (ANCEF) 2-4 GM/100ML-% IVPB       Note to Pharmacy: Aquilla Hacker   : cabinet override      11/28/19 1759 11/28/19 1911       Objective: Vitals:   11/29/19 0800 11/29/19 1000 11/29/19 1200 11/29/19 1400  BP: (!) 121/38 (!) 112/38 (!) 114/40 (!) 105/39  Pulse: 79  79   Resp: 18 15 (!) 21 13  Temp:      TempSrc:      SpO2: 99%  100%   Weight:      Height:        Intake/Output Summary (Last 24 hours) at 11/29/2019 1421 Last data filed at 11/29/2019 0800 Gross per 24 hour  Intake 2301.54 ml  Output 250 ml  Net 2051.54 ml   Filed Weights   11/28/19 1813 11/28/19 2213  Weight: 49.9 kg 46.3 kg    Examination: General exam: Appears comfortable  HEENT: PERRLA, oral mucosa moist, no sclera icterus or  thrush Respiratory system: Clear to auscultation. Respiratory effort normal. Cardiovascular system: S1 & S2 heard, RRR.   Gastrointestinal system: Abdomen soft, non-tender, nondistended. Normal bowel sounds. Central nervous system: Alert and oriented to person, place (hospital) but not time. No focal neurological deficits. Extremities: No cyanosis, clubbing or edema Skin: No rashes or ulcers Psychiatry:  Mood & affect appropriate.     Data Reviewed: I have personally reviewed following labs and imaging studies  CBC: Recent Labs  Lab 11/26/19 1544 11/26/19 1829 11/28/19 1543 11/29/19 1149  WBC 10.5 9.7 19.7* 11.5*  NEUTROABS 8.1*  --  17.4*  --   HGB 15.7* 15.9* 13.7 8.8*  HCT 47.9* 47.6* 42.0 26.6*  MCV 92.5 91.5 94.2 95.3  PLT 225 242 243 148*   Basic Metabolic Panel: Recent Labs  Lab 11/26/19 1544 11/26/19 1829 11/28/19 1543  NA 139  --  142  K 4.0  --  3.7  CL 104  --  109  CO2 23  --  19*  GLUCOSE 95  --  118*  BUN 20  --  20  CREATININE 0.82 0.84 0.96  CALCIUM 8.6*  --  8.5*   GFR: Estimated Creatinine Clearance: 26.3 mL/min (by C-G formula based on SCr of 0.96 mg/dL). Liver Function Tests: Recent Labs  Lab 11/26/19 1544  AST 32  ALT 24  ALKPHOS 78  BILITOT 1.0  PROT 6.6  ALBUMIN 3.5   No results for input(s): LIPASE, AMYLASE in the last 168 hours. Recent Labs  Lab 11/27/19 1055  AMMONIA 44*   Coagulation Profile: Recent Labs  Lab 11/28/19 1543  INR 1.2   Cardiac Enzymes: No results for input(s): CKTOTAL, CKMB, CKMBINDEX, TROPONINI in the last 168 hours. BNP (last 3 results) No results for input(s): PROBNP in the last 8760 hours. HbA1C: No results for input(s): HGBA1C in the last 72 hours. CBG: Recent Labs  Lab 11/26/19 1829  GLUCAP 94   Lipid Profile: No results for input(s): CHOL, HDL, LDLCALC, TRIG, CHOLHDL, LDLDIRECT in the last 72 hours. Thyroid Function Tests: Recent Labs    11/27/19 0752  TSH 2.119   Anemia  Panel: Recent Labs    11/27/19 0752  VITAMINB12 792   Urine analysis:    Component Value Date/Time   COLORURINE YELLOW 11/26/2019 1322   APPEARANCEUR CLEAR 11/26/2019 1322   LABSPEC >1.030 (H) 11/26/2019 1322   PHURINE 5.5 11/26/2019 1322   GLUCOSEU NEGATIVE 11/26/2019 1322   HGBUR SMALL (A) 11/26/2019 1322   BILIRUBINUR  NEGATIVE 11/26/2019 1322   KETONESUR NEGATIVE 11/26/2019 1322   PROTEINUR NEGATIVE 11/26/2019 1322   UROBILINOGEN 0.2 06/17/2012 1645   NITRITE NEGATIVE 11/26/2019 1322   LEUKOCYTESUR TRACE (A) 11/26/2019 1322   Sepsis Labs: @LABRCNTIP (procalcitonin:4,lacticidven:4) ) Recent Results (from the past 240 hour(s))  Urine culture     Status: None   Collection Time: 11/26/19 12:58 PM   Specimen: Urine, Catheterized  Result Value Ref Range Status   Specimen Description URINE, CATHETERIZED  Final   Special Requests NONE  Final   Culture   Final    NO GROWTH Performed at [facility], [address] [place], [place]    Report Status 11/27/2019 FINAL  Final  Respiratory Panel by RT PCR (Flu A&B, Covid) - Nasopharyngeal Swab     Status: None   Collection Time: 11/26/19  5:06 PM   Specimen: Nasopharyngeal Swab  Result Value Ref Range Status   SARS Coronavirus 2 by RT PCR NEGATIVE NEGATIVE Final    Comment: (NOTE) SARS-CoV-2 target nucleic acids are NOT DETECTED.  The SARS-CoV-2 RNA is generally detectable in upper respiratoy specimens during the acute phase of infection. The lowest concentration of SARS-CoV-2 viral copies this assay can detect is 131 copies/mL. A negative result does not preclude SARS-Cov-2 infection and should not be used as the sole basis for treatment or other patient management decisions. A negative result may occur with  improper specimen collection/handling, submission of specimen other than nasopharyngeal swab, presence of viral mutation(s) within the areas targeted by this assay, and inadequate number of viral  copies (<131 copies/mL). A negative result must be combined with clinical observations, patient history, and epidemiological information. The expected result is Negative.  Fact Sheet for Patients:  11/28/19  Fact Sheet for Healthcare Providers:  https://www.moore.com/  This test is no t yet approved or cleared by the https://www.young.biz/ FDA and  has been authorized for detection and/or diagnosis of SARS-CoV-2 by FDA under an Emergency Use Authorization (EUA). This EUA will remain  in effect (meaning this test can be used) for the duration of the COVID-19 declaration under Section 564(b)(1) of the Act, 21 U.S.C. section 360bbb-3(b)(1), unless the authorization is terminated or revoked sooner.     Influenza A by PCR NEGATIVE NEGATIVE Final   Influenza B by PCR NEGATIVE NEGATIVE Final    Comment: (NOTE) The Xpert Xpress SARS-CoV-2/FLU/RSV assay is intended as an aid in  the diagnosis of influenza from Nasopharyngeal swab specimens and  should not be used as a sole basis for treatment. Nasal washings and  aspirates are unacceptable for Xpert Xpress SARS-CoV-2/FLU/RSV  testing.  Fact Sheet for Patients: [place]  Fact Sheet for Healthcare Providers: https://www.moore.com/  This test is not yet approved or cleared by the https://www.young.biz/ FDA and  has been authorized for detection and/or diagnosis of SARS-CoV-2 by  FDA under an Emergency Use Authorization (EUA). This EUA will remain  in effect (meaning this test can be used) for the duration of the  Covid-19 declaration under Section 564(b)(1) of the Act, 21  U.S.C. section 360bbb-3(b)(1), unless the authorization is  terminated or revoked. Performed at [facility], [address], [place], [place]   Respiratory Panel by RT PCR (Flu A&B, Covid) - Nasopharyngeal Swab     Status: None   Collection Time: 11/28/19  4:04  PM   Specimen: Nasopharyngeal Swab  Result Value Ref Range Status   SARS Coronavirus 2 by RT PCR NEGATIVE NEGATIVE Final  Comment: (NOTE) SARS-CoV-2 target nucleic acids are NOT DETECTED.  The SARS-CoV-2 RNA is generally detectable in upper respiratoy specimens during the acute phase of infection. The lowest concentration of SARS-CoV-2 viral copies this assay can detect is 131 copies/mL. A negative result does not preclude SARS-Cov-2 infection and should not be used as the sole basis for treatment or other patient management decisions. A negative result may occur with  improper specimen collection/handling, submission of specimen other than nasopharyngeal swab, presence of viral mutation(s) within the areas targeted by this assay, and inadequate number of viral copies (<131 copies/mL). A negative result must be combined with clinical observations, patient history, and epidemiological information. The expected result is Negative.  Fact Sheet for Patients:  https://www.moore.com/  Fact Sheet for Healthcare Providers:  https://www.young.biz/  This test is no t yet approved or cleared by the [place] FDA and  has been authorized for detection and/or diagnosis of SARS-CoV-2 by FDA under an Emergency Use Authorization (EUA). This EUA will remain  in effect (meaning this test can be used) for the duration of the COVID-19 declaration under Section 564(b)(1) of the Act, 21 U.S.C. section 360bbb-3(b)(1), unless the authorization is terminated or revoked sooner.     Influenza A by PCR NEGATIVE NEGATIVE Final   Influenza B by PCR NEGATIVE NEGATIVE Final    Comment: (NOTE) The Xpert Xpress SARS-CoV-2/FLU/RSV assay is intended as an aid in  the diagnosis of influenza from Nasopharyngeal swab specimens and  should not be used as a sole basis for treatment. Nasal washings and  aspirates are unacceptable for Xpert Xpress SARS-CoV-2/FLU/RSV   testing.  Fact Sheet for Patients: https://www.moore.com/  Fact Sheet for Healthcare Providers: https://www.young.biz/  This test is not yet approved or cleared by the [place] FDA and  has been authorized for detection and/or diagnosis of SARS-CoV-2 by  FDA under an Emergency Use Authorization (EUA). This EUA will remain  in effect (meaning this test can be used) for the duration of the  Covid-19 declaration under Section 564(b)(1) of the Act, 21  U.S.C. section 360bbb-3(b)(1), unless the authorization is  terminated or revoked. Performed at [facility], [address], [place], [place] [address]          Radiology Studies: DG Knee 2 Views Right  Result Date: 11/28/2019 CLINICAL DATA:  Larey Seat.  Right hip pain. EXAM: RIGHT KNEE - 1-2 VIEW COMPARISON:  None. FINDINGS: The right knee joint is maintained. No acute fracture is identified. IMPRESSION: No acute bony findings. Electronically Signed   By: Rudie Meyer M.D.   On: 11/28/2019 14:51   CT Head Wo Contrast  Result Date: 11/28/2019 CLINICAL DATA:  Neck trauma. Patient fell in was seen yesterday. Patient had another fall today. EXAM: CT HEAD WITHOUT CONTRAST CT CERVICAL SPINE WITHOUT CONTRAST TECHNIQUE: Multidetector CT imaging of the head and cervical spine was performed following the standard protocol without intravenous contrast. Multiplanar CT image reconstructions of the cervical spine were also generated. COMPARISON:  11/26/2019 FINDINGS: CT HEAD FINDINGS Brain: There is minimal periventricular white matter change. There is no intra or extra-axial fluid collection or mass lesion. The basilar cisterns and ventricles have a normal appearance. There is no CT evidence for acute infarction or hemorrhage. Vascular: There is atherosclerotic calcification of the internal carotid arteries. No hyperdense vessels. Skull: Normal. Negative for fracture or focal lesion. Sinuses/Orbits: No  acute finding. Other: None CT CERVICAL SPINE FINDINGS There is significant patient motion artifact. Alignment: There is trace, stable anterolisthesis  of C3 on C4 and C7 on T1. Trace retrolisthesis of C5 on C6. Otherwise alignment is normal. Skull base and vertebrae: No acute fracture. No primary bone lesion or focal pathologic process. Soft tissues and spinal canal: No prevertebral fluid or swelling. No visible canal hematoma. Disc levels: Moderate disc height loss and uncovertebral spurring primarily at C4-5, C5-6, C6-7. Upper chest: Negative. Other: None IMPRESSION: 1. No evidence for acute intracranial abnormality. 2. Minimal periventricular white matter changes. 3. Patient motion artifact. 4. Moderate mid cervical degenerative changes. 5. No evidence for acute cervical spine abnormality. Electronically Signed   By: Norva Pavlov M.D.   On: 11/28/2019 14:36   CT Cervical Spine Wo Contrast  Result Date: 11/28/2019 CLINICAL DATA:  Neck trauma. Patient fell in was seen yesterday. Patient had another fall today. EXAM: CT HEAD WITHOUT CONTRAST CT CERVICAL SPINE WITHOUT CONTRAST TECHNIQUE: Multidetector CT imaging of the head and cervical spine was performed following the standard protocol without intravenous contrast. Multiplanar CT image reconstructions of the cervical spine were also generated. COMPARISON:  11/26/2019 FINDINGS: CT HEAD FINDINGS Brain: There is minimal periventricular white matter change. There is no intra or extra-axial fluid collection or mass lesion. The basilar cisterns and ventricles have a normal appearance. There is no CT evidence for acute infarction or hemorrhage. Vascular: There is atherosclerotic calcification of the internal carotid arteries. No hyperdense vessels. Skull: Normal. Negative for fracture or focal lesion. Sinuses/Orbits: No acute finding. Other: None CT CERVICAL SPINE FINDINGS There is significant patient motion artifact. Alignment: There is trace, stable  anterolisthesis of C3 on C4 and C7 on T1. Trace retrolisthesis of C5 on C6. Otherwise alignment is normal. Skull base and vertebrae: No acute fracture. No primary bone lesion or focal pathologic process. Soft tissues and spinal canal: No prevertebral fluid or swelling. No visible canal hematoma. Disc levels: Moderate disc height loss and uncovertebral spurring primarily at C4-5, C5-6, C6-7. Upper chest: Negative. Other: None IMPRESSION: 1. No evidence for acute intracranial abnormality. 2. Minimal periventricular white matter changes. 3. Patient motion artifact. 4. Moderate mid cervical degenerative changes. 5. No evidence for acute cervical spine abnormality. Electronically Signed   By: Norva Pavlov M.D.   On: 11/28/2019 14:36   DG C-Arm 1-60 Min  Result Date: 11/28/2019 CLINICAL DATA:  Status post intramedullary nail placement EXAM: DG C-ARM 1-60 MIN FLUOROSCOPY TIME:  Fluoroscopy Time:  1 minutes and 10 seconds Radiation Exposure Index (if provided by the fluoroscopic device): 8.1 mGy Number of Acquired Spot Images: 4 COMPARISON:  11/28/2019 FINDINGS: The patient has undergone intramedullary nail placement. There are expected postsurgical changes. The alignment is improved. The hardware is intact. IMPRESSION: Status post intramedullary nail placement. No evidence of hardware complication. Electronically Signed   By: Katherine Mantle M.D.   On: 11/28/2019 20:23   DG Hip Unilat W or Wo Pelvis 2-3 Views Right  Result Date: 11/28/2019 CLINICAL DATA:  Larey Seat.  Right hip pain. EXAM: DG HIP (WITH OR WITHOUT PELVIS) 2-3V RIGHT COMPARISON:  None. FINDINGS: There is a displaced intertrochanteric fracture of the right hip. Both hips are normally located.  No pelvic fractures are identified. IMPRESSION: Displaced intertrochanteric fracture of the right hip. Electronically Signed   By: Rudie Meyer M.D.   On: 11/28/2019 14:50   DG FEMUR, MIN 2 VIEWS RIGHT  Result Date: 11/28/2019 CLINICAL DATA:  Status  post intramedullary nail placement EXAM: RIGHT FEMUR 2 VIEWS COMPARISON:  11/28/2019 FINDINGS: The patient has undergone intramedullary nail placement. There are expected  postsurgical changes. The alignment is improved. The hardware is intact. IMPRESSION: Status post intramedullary nail placement with improved alignment. No evidence of hardware complication. Electronically Signed   By: Katherine Mantlehristopher  Green M.D.   On: 11/28/2019 20:24   DG Femur Min 2 Views Right  Result Date: 11/28/2019 CLINICAL DATA:  Larey SeatFell.  Right hip pain. EXAM: RIGHT FEMUR 2 VIEWS COMPARISON:  None. FINDINGS: There is a displaced intertrochanteric fracture of the right hip. The right hip is normally located. No pelvic fractures are identified. IMPRESSION: Displaced intertrochanteric fracture of the right hip. Electronically Signed   By: Rudie MeyerP.  Gallerani M.D.   On: 11/28/2019 14:47      Scheduled Meds: . acetaminophen  500 mg Oral Q6H  . darifenacin  7.5 mg Oral Daily  . docusate sodium  100 mg Oral BID  . donepezil  10 mg Oral QHS  . enoxaparin (LOVENOX) injection  30 mg Subcutaneous Q24H  . feeding supplement  237 mL Oral BID BM  . levothyroxine  25 mcg Oral QAC breakfast  . loratadine  10 mg Oral Daily  . memantine  10 mg Oral BID  . multivitamin with minerals  1 tablet Oral Q breakfast  . pantoprazole  40 mg Oral Daily  . QUEtiapine  50 mg Oral BID  . sertraline  25 mg Oral QHS  . traZODone  50 mg Oral QHS   Continuous Infusions: . lactated ringers 50 mL/hr at 11/29/19 0238     LOS: 1 day      Calvert CantorSaima Sanjuanita Condrey, MD [facility] Pager: [email or web address] 11/29/2019, 2:21 PM

## 2019-11-29 NOTE — Progress Notes (Addendum)
   Subjective: Patient is a 86 year old female s/p right IM Hip nailing by Dr. Aundria Rud.  Patient reports pain as moderate at the hip. She reports some tingling. She is accompanied by her son today.   Objective:   VITALS:   Vitals:   11/29/19 0000 11/29/19 0002 11/29/19 0100 11/29/19 0200  BP: (!) 92/43  (!) 107/47 (!) 134/51  Pulse:  100 87   Resp: 15  12 12   Temp: 98 F (36.7 C)     TempSrc: Oral     SpO2: 96%  99%   Weight:      Height:       General: No acute distress, resting comfortably in the bed.   Right Lower Extremity:  Neurologically intact Sensation intact distally Intact pulses distally Dorsiflexion/Plantar flexion intact Incision: dressing C/D/I Able to move all her toes. No bleeding noted on her dressings.   Lab Results  Component Value Date   WBC 19.7 (H) 11/28/2019   HGB 13.7 11/28/2019   HCT 42.0 11/28/2019   MCV 94.2 11/28/2019   PLT 243 11/28/2019   BMET    Component Value Date/Time   NA 142 11/28/2019 1543   K 3.7 11/28/2019 1543   CL 109 11/28/2019 1543   CO2 19 (L) 11/28/2019 1543   GLUCOSE 118 (H) 11/28/2019 1543   BUN 20 11/28/2019 1543   CREATININE 0.96 11/28/2019 1543   CALCIUM 8.5 (L) 11/28/2019 1543   GFRNONAA 55 (L) 11/28/2019 1543   GFRAA >60 04/30/2019 1850     Assessment/Plan: 1 Day Post-Op   Principal Problem:   Intertrochanteric fracture of right femur (HCC) Active Problems:   Hypothyroidism   Mild persistent chronic asthma without complication   Delirium   Closed right hip fracture, initial encounter (HCC)  Dressing intact. maintain dressing until follow up in 2 weeks unless saturated, change PRN if needed.  Advance diet Up with therapy Weightbearing Status: WBAT to the right lower extremity DVT Prophylaxis: Lovenox while inpatient,  81mg  aspirin twice a day for 6 weeks upon discharge  Return to [facility] in 2 weeks for staple removal, wound check, and x-rays with Dr. or [facility] PA-C  [phone]  Dion Saucier 11/29/2019, 8:40 AM  Arbie Cookey PA-C  Physician Assistant with Dr. 12/01/2019 [facility]

## 2019-11-29 NOTE — Evaluation (Signed)
Physical Therapy Evaluation Patient Details Name: Mackenzie Peterson MRN: [id number] DOB: 08/09/1924 Today's Date: 11/29/2019   History of Present Illness  Mackenzie Peterson is a 86 y.o. female with medical history significant of alzheimer's, htn. Hyperlipidemia,hypothyroid, PVD seen in ed for Fall and rt hip fracture.  Patient discharged yesterday for delirium admission to rehab facility.  Since which patient was 5 after an unwitnessed fall and patient was not able to get up.  Imaging showed that patient has sustained a right intertrochanteric fracture.  Clinical Impression  Pt admitted with above diagnosis. Pt was able to pivot to recliner with +2 min to mod assist. Will need SNF to progress ambulation vs 24 hour care at home.  Will follow acutely.  Pt currently with functional limitations due to the deficits listed below (see PT Problem List). Pt will benefit from skilled PT to increase their independence and safety with mobility to allow discharge to the venue listed below.      Follow Up Recommendations SNF;Supervision/Assistance - 24 hour    Equipment Recommendations  Rolling walker with 5" wheels;3in1 (PT)    Recommendations for Other Services       Precautions / Restrictions Precautions Precautions: Fall Restrictions Weight Bearing Restrictions: Yes RLE Weight Bearing: Weight bearing as tolerated      Mobility  Bed Mobility Overal bed mobility: Needs Assistance Bed Mobility: Supine to Sit     Supine to sit: Mod assist;+2 for physical assistance     General bed mobility comments: Pt needed assist as she had pain right hip.      Transfers Overall transfer level: Needs assistance Equipment used: 2 person hand held assist Transfers: Sit to/from [employer] Sit to Stand: Mod assist Stand pivot transfers: Min assist       General transfer comment: Pt needed mod assis t to power up and min assist to take pivotal steps to the recliner.    Ambulation/Gait                 Stairs            Wheelchair Mobility    Modified Rankin (Stroke Patients Only)       Balance Overall balance assessment: Needs assistance Sitting-balance support: No upper extremity supported;Feet supported Sitting balance-Leahy Scale: Fair     Standing balance support: Bilateral upper extremity supported;During functional activity Standing balance-Leahy Scale: Poor Standing balance comment: Reliant on BUE support                              Pertinent Vitals/Pain Pain Assessment: Faces Faces Pain Scale: Hurts even more Pain Location: right hip Pain Descriptors / Indicators: Aching;Grimacing;Guarding;Sore Pain Intervention(s): Limited activity within patient's tolerance;Monitored during session;Repositioned;Patient requesting pain meds-RN notified;Ice applied    Home Living Family/patient expects to be discharged to:: Skilled nursing facility Living Arrangements: Children;Other relatives Available Help at Discharge: Family;Available 24 hours/day Type of Home: House       Home Layout: One level Home Equipment: Wheelchair - manual Additional Comments: Pt had Peterson in SNF for less than a day and fell.  No family present to determine if they can provide more care.  info above was obtained on 10/28 admission as pt having difficulty answering questions today.     Prior Function Level of Independence: Needs assistance   Gait / Transfers Assistance Needed: Per son, pt requires assist to ambulate. Does use WC sometimes  ADL's / Homemaking Assistance Needed: Requires assist  for ADLs.         Hand Dominance        Extremity/Trunk Assessment   Upper Extremity Assessment Upper Extremity Assessment: Defer to OT evaluation    Lower Extremity Assessment Lower Extremity Assessment: RLE deficits/detail RLE: Unable to fully assess due to pain    Cervical / Trunk Assessment Cervical / Trunk Assessment: Kyphotic  Communication    Communication: Prefers language other than [place] (interpreter [phone] Rayburn Ma, speaks Spanish)  Cognition Arousal/Alertness: Awake/alert Behavior During Therapy: WFL for tasks assessed/performed Overall Cognitive Status: History of cognitive impairments - at baseline                                 General Comments: History of dementia at baseline, oriented to person. Unable to orient self to place despite max cues and choices. Increased processing time and cues needed      General Comments      Exercises General Exercises - Lower Extremity Ankle Circles/Pumps: AROM;Both;10 reps;Supine Quad Sets: AROM;Both;10 reps;Supine Heel Slides: AROM;Both;10 reps;Supine   Assessment/Plan    PT Assessment Patient needs continued PT services  PT Problem List Decreased strength;Decreased balance;Decreased mobility;Decreased cognition;Decreased safety awareness;Decreased knowledge of use of DME;Decreased knowledge of precautions;Decreased activity tolerance       PT Treatment Interventions Gait training;Functional mobility training;Therapeutic activities;Therapeutic exercise;Balance training;Patient/family education    PT Goals (Current goals can be found in the Care Plan section)  Acute Rehab PT Goals Patient Stated Goal: unable to state PT Goal Formulation: With patient Time For Goal Achievement: 12/13/19 Potential to Achieve Goals: Good    Frequency Min 3X/week   Barriers to discharge Decreased caregiver support      Co-evaluation               AM-PAC PT "6 Clicks" Mobility  Outcome Measure Help needed turning from your back to your side while in a flat bed without using bedrails?: A Lot Help needed moving from lying on your back to sitting on the side of a flat bed without using bedrails?: A Lot Help needed moving to and from a bed to a chair (including a wheelchair)?: A Lot Help needed standing up from a chair using your arms (e.g., wheelchair or bedside  chair)?: A Lot Help needed to walk in hospital room?: Total Help needed climbing 3-5 steps with a railing? : Total 6 Click Score: 10    End of Session Equipment Utilized During Treatment: Gait belt Activity Tolerance: Patient tolerated treatment well Patient left: in chair;with call bell/phone within reach;with chair alarm set Nurse Communication: Mobility status;Patient requests pain meds PT Visit Diagnosis: Unsteadiness on feet (R26.81);Muscle weakness (generalized) (M62.81)    Time: 1165-7903 PT Time Calculation (min) (ACUTE ONLY): 24 min   Charges:   PT Evaluation $PT Eval Moderate Complexity: 1 Mod PT Treatments $Therapeutic Activity: 8-22 mins        Eulis Salazar W,PT Acute Rehabilitation Services Pager:  [phone]  Office:  [phone]    Berline Lopes 11/29/2019, 1:49 PM

## 2019-11-29 NOTE — Progress Notes (Signed)
Bladder scan volume as of 0400 is . Patient stated she felt that she was holding urine and felt the urge to go, but as of this time has not yet voided.

## 2019-11-29 NOTE — Progress Notes (Signed)
Patient has not yet voided. Attempted bladder scan; one resulted in but patient refused further attempts. No abdominal distension or pain, patient denies feeling need to urinate. Notified on call MD. Will attempt to scan again in 1 hour.

## 2019-11-29 NOTE — Consult Note (Addendum)
NEURO HOSPITALIST CONSULT NOTE   Requesting physician: Dr. Butler Denmark  Reason for Consult: Staring spells  History obtained from:  Son, [place]  HPI:                                                                                                                                          Mackenzie Peterson is an 86 y.o. female with a past medical history significant for Alzheimer's dementia, hypertension, peripheral vascular disease who has been admitted to [facility] secondary to a fall and fracture of the right hip. My visit with her was concerning several staring spells witnessed by her son over the two-day hospital stay from earlier this week and the morning of this visit's admission.   On the morning of 27Oct2021, Mackenzie Peterson experienced a staring episode that lasted a few minutes after her son woke her up late that morning. Her son describes her behavior as a rigid stare, straight ahead, and repeated "nada", regardless of whether or not it was an appropriate response to the question asked. She had no other movements of the face or extremities, no incontinence during this time. During her hospital stay, she had 2-3 more of these staring episodes, all with repeated forceful eyelid blinking and a vocalization, "ahhhhh". The final episode occurred in the presence of EMS before re-admission on the 29th when she stared, had repeated, forceful eyelid blinking, her lips turned blue and her extremities stiffened before vocalizing. These episodes have never occurred before. She has no history of a seizure disorder.  I spoke with Ms. Hobbins's daughter in-law Natalia Leatherwood) who controls her medications, and she stated that Mackenzie Peterson is given 1 Xanex (0.5mg ) in the morning and 1-2 at night. She tends to be anxious during the day and Natalia Leatherwood will sometimes give her an additional dose, but not if she has two the night before. Never more than three Xanex within a 24 hour period. She got two Xanex the evening of the  26th and none the morning of the 27th before the first staring spell. It does not appear as though Ms. Belcher has been given Xanex when in the hospital, during this stay or the prior (ordered as PRN anxiety).  Son, [place], present at bedside. Natalia Leatherwood available by phone.  Pertinent Medications Xanex 0.5mg  PRN anxiety Seroquel - 25mg  QD Zoloft - 25mg  QD Trazodone - 50mg  QD Namenda - 10mg  BID Synthroid - QD  Pertinent Imaging/Diagnostics 28Oct2021 - EEG - mild, diffuse encephalopathy, no seizure or epileptiform discharges  Past Medical History:  Diagnosis Date  . Alzheimer disease (HCC)   . Asthma   . Chronic bronchitis (HCC)   . Dysthymic disorder   . Emphysema   . Hyperlipidemia   . Hypertension   . hypothyroid   . PVD (peripheral  vascular disease) (HCC)     History reviewed. No pertinent surgical history.  History reviewed. No pertinent family history.  Social History:  reports that she has never smoked. She has never used smokeless tobacco. She reports that she does not drink alcohol and does not use drugs.  No Known Allergies  MEDICATIONS:                                                                                                                   Current Meds  Medication Sig  . acetaminophen (TYLENOL) 500 MG tablet Take 500-1,000 mg by mouth every 6 (six) hours as needed for mild pain or headache.  . albuterol (PROVENTIL) (2.5 MG/3ML) 0.083% nebulizer solution Take 2.5 mg by nebulization 3 (three) times daily as needed for wheezing or shortness of breath.   . ALPRAZolam (XANAX) 0.5 MG tablet Take 0.5 mg by mouth 3 (three) times daily as needed for anxiety.   Marland Kitchen aspirin 81 MG tablet Take 81 mg by mouth daily.  . cetirizine (ZYRTEC) 10 MG tablet Take 10 mg by mouth at bedtime.  . donepezil (ARICEPT) 10 MG tablet Take 10 mg by mouth at bedtime.  Marland Kitchen levothyroxine (SYNTHROID, LEVOTHROID) 25 MCG tablet Take 25 mcg by mouth daily before breakfast.   . memantine  (NAMENDA) 10 MG tablet Take 10 mg by mouth 2 (two) times daily.  . Multiple Vitamins-Minerals (ONE-A-DAY PROACTIVE 65+) TABS Take 1 tablet by mouth daily with breakfast.  . nitroGLYCERIN (NITROSTAT) 0.4 MG SL tablet Place 0.4 mg under the tongue every 5 (five) minutes as needed for chest pain.   Marland Kitchen omeprazole (PRILOSEC) 20 MG capsule TAKE 1 CAPSULE BY MOUTH 30 TO 60 MINUTES BEFORE THE FIRST AND LAST MEALS OF THE DAY (Patient taking differently: Take 20 mg by mouth 2 (two) times daily before a meal. )  . pravastatin (PRAVACHOL) 20 MG tablet Take 20 mg by mouth at bedtime.  Marland Kitchen QUEtiapine (SEROQUEL) 50 MG tablet Take 50 mg by mouth 2 (two) times daily.  . sertraline (ZOLOFT) 25 MG tablet Take 25 mg by mouth at bedtime.  . solifenacin (VESICARE) 5 MG tablet Take 5 mg by mouth at bedtime.   . traZODone (DESYREL) 50 MG tablet Take 50 mg by mouth at bedtime.     Review Of Systems:                                                                                                           History obtained from unobtainable from patient due to mental status  Blood pressure Marland Kitchen)  105/39, pulse 76, temperature 98 F (36.7 C), temperature source Oral, resp. rate 13, height 5' (1.524 m), weight 46.3 kg, SpO2 100 %.   Physical Examination:                                                                                                      General: WDWN female. Appears calm and comfortable, sitting in the chair adjacent to the bed HEENT:  Normocephalic, large swelling and bruising on the right maxilla/temopral/eye region. No obvious abnormality otherwise. Normal external ears and external nose, mucus membranes. Cardiovascular: RRR Pulmonary: Breathing comfortably on room air Abdomen: Soft Extremities: no joint deformities, effusion, or inflammation Musculoskeletal: Tone and bulk normal for age, some atrophy noted Skin: warm and dry, no hyperpigmentation, vitiligo, or suspicious lesions other than noted above. I  did not assess surgical site.  Neurological Examination:                                                                                               Mental Status: [name] is alert and oriented to self. She is at her baseline cognitively per her son. Speech fluent without evidence of aphasia.  Cranial Nerves: II: Visual fields grossly normal, pupils are equal, round, reactive to light. III,IV, VI: Ptosis not present, extra-ocular muscle movements intact bilaterally V,VII: Smile and eyebrow raise is symmetric. Facial light touch sensation intact bilaterally VIII: Hearing grossly intact to loud speaking voice IX,X: Uvula and palate rise symmetrically XI: SCM and bilateral shoulder shrug strength 5/5 XII: Midline tongue extension Motor: Right :     Upper extremity   5/5   Left:     Upper extremity   5/5 Given recent surgery, the strength of bilateral lower extremities was not tested - Ms. Kreiger was able to wiggle the toes of both feet without a problem. Pronator drift not present Sensory: Light touch sense intact throughout, bilaterally, endorses some lost sensation from the feet to mid-calf, bilaterally. Deep Tendon Reflexes: 1+ and symmetric throughout Plantars: Right: downgoing   Left: downgoing Cerebellar: Finger-to-nose test without evidence of dysmetria or ataxia.  Gait: Not tested   Lab Results: Basic Metabolic Panel: Recent Labs  Lab 11/26/19 1544 11/26/19 1829 11/28/19 1543  NA 139  --  142  K 4.0  --  3.7  CL 104  --  109  CO2 23  --  19*  GLUCOSE 95  --  118*  BUN 20  --  20  CREATININE 0.82 0.84 0.96  CALCIUM 8.6*  --  8.5*    Liver Function Tests: Recent Labs  Lab 11/26/19 1544  AST 32  ALT 24  ALKPHOS 78  BILITOT 1.0  PROT 6.6  ALBUMIN 3.5   No results for input(s): LIPASE, AMYLASE in the last 168 hours. Recent Labs  Lab 11/27/19 1055  AMMONIA 44*    CBC: Recent Labs  Lab 11/26/19 1544 11/26/19 1829 11/28/19 1543 11/29/19 1149  WBC  10.5 9.7 19.7* 11.5*  NEUTROABS 8.1*  --  17.4*  --   HGB 15.7* 15.9* 13.7 8.8*  HCT 47.9* 47.6* 42.0 26.6*  MCV 92.5 91.5 94.2 95.3  PLT 225 242 243 148*    Cardiac Enzymes: No results for input(s): CKTOTAL, CKMB, CKMBINDEX, TROPONINI in the last 168 hours.  Lipid Panel: No results for input(s): CHOL, TRIG, HDL, CHOLHDL, VLDL, LDLCALC in the last 168 hours.  CBG: Recent Labs  Lab 11/26/19 1829  GLUCAP 94    Microbiology: Results for orders placed or performed during the hospital encounter of 11/28/19  Respiratory Panel by RT PCR (Flu A&B, Covid) - Nasopharyngeal Swab     Status: None   Collection Time: 11/28/19  4:04 PM   Specimen: Nasopharyngeal Swab  Result Value Ref Range Status   SARS Coronavirus 2 by RT PCR NEGATIVE NEGATIVE Final    Comment: (NOTE) SARS-CoV-2 target nucleic acids are NOT DETECTED.  The SARS-CoV-2 RNA is generally detectable in upper respiratoy specimens during the acute phase of infection. The lowest concentration of SARS-CoV-2 viral copies this assay can detect is 131 copies/mL. A negative result does not preclude SARS-Cov-2 infection and should not be used as the sole basis for treatment or other patient management decisions. A negative result may occur with  improper specimen collection/handling, submission of specimen other than nasopharyngeal swab, presence of viral mutation(s) within the areas targeted by this assay, and inadequate number of viral copies (<131 copies/mL). A negative result must be combined with clinical observations, patient history, and epidemiological information. The expected result is Negative.  Fact Sheet for Patients:  https://www.moore.com/  Fact Sheet for Healthcare Providers:  https://www.young.biz/  This test is no t yet approved or cleared by the [place] FDA and  has been authorized for detection and/or diagnosis of SARS-CoV-2 by FDA under an Emergency Use  Authorization (EUA). This EUA will remain  in effect (meaning this test can be used) for the duration of the COVID-19 declaration under Section 564(b)(1) of the Act, 21 U.S.C. section 360bbb-3(b)(1), unless the authorization is terminated or revoked sooner.     Influenza A by PCR NEGATIVE NEGATIVE Final   Influenza B by PCR NEGATIVE NEGATIVE Final    Comment: (NOTE) The Xpert Xpress SARS-CoV-2/FLU/RSV assay is intended as an aid in  the diagnosis of influenza from Nasopharyngeal swab specimens and  should not be used as a sole basis for treatment. Nasal washings and  aspirates are unacceptable for Xpert Xpress SARS-CoV-2/FLU/RSV  testing.  Fact Sheet for Patients: https://www.moore.com/  Fact Sheet for Healthcare Providers: https://www.young.biz/  This test is not yet approved or cleared by the [place] FDA and  has been authorized for detection and/or diagnosis of SARS-CoV-2 by  FDA under an Emergency Use Authorization (EUA). This EUA will remain  in effect (meaning this test can be used) for the duration of the  Covid-19 declaration under Section 564(b)(1) of the Act, 21  U.S.C. section 360bbb-3(b)(1), unless the authorization is  terminated or revoked. Performed at [facility], [address], [place], [place] [address]     Coagulation Studies: Recent Labs    11/28/19 1543  LABPROT 15.0  INR 1.2    Imaging: DG Knee 2 Views Right  Result Date: 11/28/2019 CLINICAL DATA:  Larey Seat.  Right hip pain. EXAM: RIGHT KNEE - 1-2 VIEW COMPARISON:  None. FINDINGS: The right knee joint is maintained. No acute fracture is identified. IMPRESSION: No acute bony findings. Electronically Signed   By: Rudie Meyer M.D.   On: 11/28/2019 14:51   CT Head Wo Contrast  Result Date: 11/28/2019 CLINICAL DATA:  Neck trauma. Patient fell in was seen yesterday. Patient had another fall today. EXAM: CT HEAD WITHOUT CONTRAST CT CERVICAL SPINE  WITHOUT CONTRAST TECHNIQUE: Multidetector CT imaging of the head and cervical spine was performed following the standard protocol without intravenous contrast. Multiplanar CT image reconstructions of the cervical spine were also generated. COMPARISON:  11/26/2019 FINDINGS: CT HEAD FINDINGS Brain: There is minimal periventricular white matter change. There is no intra or extra-axial fluid collection or mass lesion. The basilar cisterns and ventricles have a normal appearance. There is no CT evidence for acute infarction or hemorrhage. Vascular: There is atherosclerotic calcification of the internal carotid arteries. No hyperdense vessels. Skull: Normal. Negative for fracture or focal lesion. Sinuses/Orbits: No acute finding. Other: None CT CERVICAL SPINE FINDINGS There is significant patient motion artifact. Alignment: There is trace, stable anterolisthesis of C3 on C4 and C7 on T1. Trace retrolisthesis of C5 on C6. Otherwise alignment is normal. Skull base and vertebrae: No acute fracture. No primary bone lesion or focal pathologic process. Soft tissues and spinal canal: No prevertebral fluid or swelling. No visible canal hematoma. Disc levels: Moderate disc height loss and uncovertebral spurring primarily at C4-5, C5-6, C6-7. Upper chest: Negative. Other: None IMPRESSION: 1. No evidence for acute intracranial abnormality. 2. Minimal periventricular white matter changes. 3. Patient motion artifact. 4. Moderate mid cervical degenerative changes. 5. No evidence for acute cervical spine abnormality. Electronically Signed   By: Norva Pavlov M.D.   On: 11/28/2019 14:36   CT Cervical Spine Wo Contrast  Result Date: 11/28/2019 CLINICAL DATA:  Neck trauma. Patient fell in was seen yesterday. Patient had another fall today. EXAM: CT HEAD WITHOUT CONTRAST CT CERVICAL SPINE WITHOUT CONTRAST TECHNIQUE: Multidetector CT imaging of the head and cervical spine was performed following the standard protocol without  intravenous contrast. Multiplanar CT image reconstructions of the cervical spine were also generated. COMPARISON:  11/26/2019 FINDINGS: CT HEAD FINDINGS Brain: There is minimal periventricular white matter change. There is no intra or extra-axial fluid collection or mass lesion. The basilar cisterns and ventricles have a normal appearance. There is no CT evidence for acute infarction or hemorrhage. Vascular: There is atherosclerotic calcification of the internal carotid arteries. No hyperdense vessels. Skull: Normal. Negative for fracture or focal lesion. Sinuses/Orbits: No acute finding. Other: None CT CERVICAL SPINE FINDINGS There is significant patient motion artifact. Alignment: There is trace, stable anterolisthesis of C3 on C4 and C7 on T1. Trace retrolisthesis of C5 on C6. Otherwise alignment is normal. Skull base and vertebrae: No acute fracture. No primary bone lesion or focal pathologic process. Soft tissues and spinal canal: No prevertebral fluid or swelling. No visible canal hematoma. Disc levels: Moderate disc height loss and uncovertebral spurring primarily at C4-5, C5-6, C6-7. Upper chest: Negative. Other: None IMPRESSION: 1. No evidence for acute intracranial abnormality. 2. Minimal periventricular white matter changes. 3. Patient motion artifact. 4. Moderate mid cervical degenerative changes. 5. No evidence for acute cervical spine abnormality. Electronically Signed   By: Norva Pavlov M.D.   On: 11/28/2019 14:36   DG C-Arm 1-60 Min  Result Date: 11/28/2019 CLINICAL DATA:  Status  post intramedullary nail placement EXAM: DG C-ARM 1-60 MIN FLUOROSCOPY TIME:  Fluoroscopy Time:  1 minutes and 10 seconds Radiation Exposure Index (if provided by the fluoroscopic device): 8.1 mGy Number of Acquired Spot Images: 4 COMPARISON:  11/28/2019 FINDINGS: The patient has undergone intramedullary nail placement. There are expected postsurgical changes. The alignment is improved. The hardware is intact.  IMPRESSION: Status post intramedullary nail placement. No evidence of hardware complication. Electronically Signed   By: Katherine Mantlehristopher  Green M.D.   On: 11/28/2019 20:23   DG Hip Unilat W or Wo Pelvis 2-3 Views Right  Result Date: 11/28/2019 CLINICAL DATA:  Larey SeatFell.  Right hip pain. EXAM: DG HIP (WITH OR WITHOUT PELVIS) 2-3V RIGHT COMPARISON:  None. FINDINGS: There is a displaced intertrochanteric fracture of the right hip. Both hips are normally located.  No pelvic fractures are identified. IMPRESSION: Displaced intertrochanteric fracture of the right hip. Electronically Signed   By: Rudie MeyerP.  Gallerani M.D.   On: 11/28/2019 14:50   DG FEMUR, MIN 2 VIEWS RIGHT  Result Date: 11/28/2019 CLINICAL DATA:  Status post intramedullary nail placement EXAM: RIGHT FEMUR 2 VIEWS COMPARISON:  11/28/2019 FINDINGS: The patient has undergone intramedullary nail placement. There are expected postsurgical changes. The alignment is improved. The hardware is intact. IMPRESSION: Status post intramedullary nail placement with improved alignment. No evidence of hardware complication. Electronically Signed   By: Katherine Mantlehristopher  Green M.D.   On: 11/28/2019 20:24   DG Femur Min 2 Views Right  Result Date: 11/28/2019 CLINICAL DATA:  Larey SeatFell.  Right hip pain. EXAM: RIGHT FEMUR 2 VIEWS COMPARISON:  None. FINDINGS: There is a displaced intertrochanteric fracture of the right hip. The right hip is normally located. No pelvic fractures are identified. IMPRESSION: Displaced intertrochanteric fracture of the right hip. Electronically Signed   By: Rudie MeyerP.  Gallerani M.D.   On: 11/28/2019 14:47   Assessment and Plan: Ms. Mackenzie Peterson is a 86 year old woman with a past medical history significant for Alzheimer's Dementia, hypertension, peripheral vascular disease who is currently in the hospital recovering from a hip fracture repair. My consult today concerned staring spells that occurred over the course of three days prior to this visit's admission. The  description of the events as noted in the HPI is concerning for seizure. The spot EEG done on 28Oct2021 showed only diffuse encephalopathy, which was supported by the clinical picture at that time. However, as someone with Alzheimer's disease, [name] is more prone to seizure than non-AD peers. It is possible that the Xanex not being provided the morning of her first admission or during her hospital stay had further lowered her seizure threshold and drove the events noted over the following days. I discussed with Natalia LeatherwoodKatherine the importance of diligence with PRN medications such as Xanex and encouraged her to discuss the possibility of a different medication with the prescribing physician to help control her mother-in-law's anxiety.  Next steps: Keppra 250mg  BID   Thank you for consulting the [facility] team. Assessment and plan per attending neurologist.   Bruna PotterJamie Aldridge PA-C [facility]  11/29/2019, 4:06 PM  I have seen the patient and reviewed the above note.  She gives the month as October, but the year as 1957.  The spells as described above had most consistent with seizures, and in the setting of someone with a history of Alzheimer's dementia, without treatment she would be at high risk of having further seizures.  I recommended starting antiepileptic therapy with Keppra and of started this at 250  twice daily.   Neurological continue to follow.  Ritta Slot, MD [facility] [phone]  If 7pm- 7am, please page neurology on call as listed in AMION.

## 2019-11-29 NOTE — Progress Notes (Signed)
Left wrist IV removed D/T leakage

## 2019-11-30 DIAGNOSIS — G309 Alzheimer's disease, unspecified: Secondary | ICD-10-CM | POA: Diagnosis not present

## 2019-11-30 DIAGNOSIS — E039 Hypothyroidism, unspecified: Secondary | ICD-10-CM | POA: Diagnosis not present

## 2019-11-30 DIAGNOSIS — D62 Acute posthemorrhagic anemia: Secondary | ICD-10-CM

## 2019-11-30 DIAGNOSIS — R569 Unspecified convulsions: Secondary | ICD-10-CM | POA: Diagnosis not present

## 2019-11-30 DIAGNOSIS — G40909 Epilepsy, unspecified, not intractable, without status epilepticus: Secondary | ICD-10-CM

## 2019-11-30 DIAGNOSIS — S72141A Displaced intertrochanteric fracture of right femur, initial encounter for closed fracture: Secondary | ICD-10-CM | POA: Diagnosis not present

## 2019-11-30 DIAGNOSIS — S72144A Nondisplaced intertrochanteric fracture of right femur, initial encounter for closed fracture: Secondary | ICD-10-CM | POA: Diagnosis not present

## 2019-11-30 LAB — HEMOGLOBIN AND HEMATOCRIT, BLOOD
HCT: 31.5 % — ABNORMAL LOW (ref 36.0–46.0)
Hemoglobin: 10.6 g/dL — ABNORMAL LOW (ref 12.0–15.0)

## 2019-11-30 LAB — CBC
HCT: 21.6 % — ABNORMAL LOW (ref 36.0–46.0)
Hemoglobin: 7.1 g/dL — ABNORMAL LOW (ref 12.0–15.0)
MCH: 30.9 pg (ref 26.0–34.0)
MCHC: 32.9 g/dL (ref 30.0–36.0)
MCV: 93.9 fL (ref 80.0–100.0)
Platelets: 127 10*3/uL — ABNORMAL LOW (ref 150–400)
RBC: 2.3 MIL/uL — ABNORMAL LOW (ref 3.87–5.11)
RDW: 13.7 % (ref 11.5–15.5)
WBC: 10.2 10*3/uL (ref 4.0–10.5)
nRBC: 0 % (ref 0.0–0.2)

## 2019-11-30 LAB — PREPARE RBC (CROSSMATCH)

## 2019-11-30 LAB — BASIC METABOLIC PANEL
Anion gap: 5 (ref 5–15)
BUN: 19 mg/dL (ref 8–23)
CO2: 28 mmol/L (ref 22–32)
Calcium: 7.6 mg/dL — ABNORMAL LOW (ref 8.9–10.3)
Chloride: 105 mmol/L (ref 98–111)
Creatinine, Ser: 0.88 mg/dL (ref 0.44–1.00)
GFR, Estimated: >60 mL/min (ref >60)
Glucose, Bld: 124 mg/dL — ABNORMAL HIGH (ref 70–99)
Potassium: 3.8 mmol/L (ref 3.5–5.1)
Sodium: 138 mmol/L (ref 135–145)

## 2019-11-30 MED ORDER — SODIUM CHLORIDE 0.9% IV SOLUTION: Freq: Once | INTRAVENOUS | Status: AC

## 2019-11-30 MED ORDER — PRAVASTATIN SODIUM 10 MG PO TABS
20.0000 mg | ORAL_TABLET | Freq: Every day | ORAL | Status: DC
  Administered 2019-11-30 – 2019-12-04 (×5): 20 mg via ORAL
  Filled 2019-11-30 (×6): qty 2

## 2019-11-30 MED ORDER — ORAL CARE MOUTH RINSE
15.0000 mL | Freq: Two times a day (BID) | OROMUCOSAL | Status: DC
  Administered 2019-11-30 – 2019-12-04 (×6): 15 mL via OROMUCOSAL

## 2019-11-30 MED ORDER — ALPRAZOLAM 0.5 MG PO TABS
0.5000 mg | ORAL_TABLET | Freq: Every day | ORAL | Status: DC
  Administered 2019-11-30 – 2019-12-05 (×6): 0.5 mg via ORAL
  Filled 2019-11-30 (×6): qty 1

## 2019-11-30 NOTE — Progress Notes (Signed)
PROGRESS NOTE    Mackenzie Peterson   RUE:[id number]  DOB: May 04, 1924  DOA: 11/28/2019 PCP: [name], MD   Brief Narrative:  Mackenzie Peterson  is a 86 y.o. mostly spanish speaking female with medical history significant of alzheimer's dementia, HTN, Hypothyroidism, PVD seen in ed for Fall and rt hip fracture. She had a hospital stay from 10/27-10/28 for confusion thought to be related to polypharmacy. She returned to baseline the following day and was discharged.  She fell at home (unwitnessed) and sustained a right hip fracture and a hematoma to the right eye.    Subjective: She has no complaints today.   Assessment & Plan:   Principal Problem:   Intertrochanteric fracture of right femur - s/p IM nail - on Lovenox for DVT prophylaxis - PT recommending SNF  Active Problems:  Dementia with sundowning (per son) - son states her current level of confusion (thinking she is in [place] and not knowing the month or year) is baseline - on Aricept and Namenda - follow for sundowning- none noted so far daughter in law give her meds _ cont Seroquel BID, Trazodone and Zoloft QHS and follow   - she receives Xanax daily in the AM and 1-2 in evenings- will continue with 1 tab BID in hospital    Anemia and thrombocytopenia - Baseline Hb seems to range from 10-15 - Hb 13.7 on admission > 8.8 by 10/30> 7.1 today- transfuse 1 U PRBC today- have obtained consent from son - possibly due to acute blood loss from fracture   Change in mental status on 10/27 - suspected to be polypharmacy - I've spoken with the son who described episode of staring and repeating word and an episode of "stiffing of her body and gurgling" prior to the MRI and followed by "no speaking" for almost an entire day and by 11 PM the following day she suddenly was back to her baseline - MRI was negative- - EEG noted below did not show epileptiform changes - Neuro eval requested- started on Keppra 250 BID for probable  seizures    Hypothyroidism - Cont Synthroid    Mild persistent chronic asthma without complication - albuterol PRN    GERD - on PPI  Hyperlipidemia - Pravachol       Time spent in minutes: 35 DVT prophylaxis: enoxaparin (LOVENOX) injection 30 mg Start: 11/29/19 0800 SCDs Start: 11/28/19 2254 SCDs Start: 11/28/19 1632  Code Status: Full code Family Communication: son, Mackenzie Peterson Disposition Plan:  Status is: Inpatient  Remains inpatient appropriate because:  requested neuro eval   Dispo: The patient is from: Home              Anticipated d/c is to: SNF              Anticipated d/c date is: > 3 days              Patient currently is medically stable to d/c.      Consultants:   Ortho Procedures:  Treatment of intertrochanteric, pertrochanteric, subtrochanteric fracture with intramedullary implant EEG:  This study is suggestive of mild diffuse encephalopathy, nonspecific etiology. No seizures or epileptiform discharges were seen throughout the recording.   Antimicrobials:  Anti-infectives (From admission, onward)   Start     Dose/Rate Route Frequency Ordered Stop   11/29/19 0600  ceFAZolin (ANCEF) IVPB 2g/100 mL premix        2 g 200 mL/hr over 30 Minutes Intravenous On call to O.R. 11/28/19 1758  11/28/19 1900   11/29/19 0000  ceFAZolin (ANCEF) IVPB 2g/100 mL premix        2 g 200 mL/hr over 30 Minutes Intravenous Every 6 hours 11/28/19 2253 11/29/19 0722   11/28/19 1759  ceFAZolin (ANCEF) 2-4 GM/100ML-% IVPB       Note to Pharmacy: Aquilla Hacker   : cabinet override      11/28/19 1759 11/28/19 1911       Objective: Vitals:   11/29/19 2000 11/30/19 0400 11/30/19 0900 11/30/19 1238  BP: (!) 126/40 (!) 109/40 (!) 108/43 (!) 117/44  Pulse:   89   Resp: 12 12    Temp: 98.2 F (36.8 C) (!) 97.5 F (36.4 C)  97.7 F (36.5 C)  TempSrc: Oral Oral  Oral  SpO2: 98% 98% 97% 93%  Weight:  48.1 kg    Height:        Intake/Output Summary (Last 24 hours)  at 11/30/2019 1430 Last data filed at 11/30/2019 1125 Gross per 24 hour  Intake 953.67 ml  Output 360 ml  Net 593.67 ml   Filed Weights   11/28/19 1813 11/28/19 2213 11/30/19 0400  Weight: 49.9 kg 46.3 kg 48.1 kg    Examination: General exam: Appears comfortable  HEENT: PERRLA, oral mucosa moist, no sclera icterus or thrush Respiratory system: Clear to auscultation. Respiratory effort normal. Cardiovascular system: S1 & S2 heard, RRR.   Gastrointestinal system: Abdomen soft, non-tender, nondistended. Normal bowel sounds. Central nervous system: Alert and oriented to person, place (hospital) but not time. No focal neurological deficits. Extremities: No cyanosis, clubbing or edema Skin: No rashes or ulcers Psychiatry:  Mood & affect appropriate.     Data Reviewed: I have personally reviewed following labs and imaging studies  CBC: Recent Labs  Lab 11/26/19 1544 11/26/19 1829 11/28/19 1543 11/29/19 1149 11/30/19 0906  WBC 10.5 9.7 19.7* 11.5* 10.2  NEUTROABS 8.1*  --  17.4*  --   --   HGB 15.7* 15.9* 13.7 8.8* 7.1*  HCT 47.9* 47.6* 42.0 26.6* 21.6*  MCV 92.5 91.5 94.2 95.3 93.9  PLT 225 242 243 148* 127*   Basic Metabolic Panel: Recent Labs  Lab 11/26/19 1544 11/26/19 1829 11/28/19 1543 11/30/19 0906  NA 139  --  142 138  K 4.0  --  3.7 3.8  CL 104  --  109 105  CO2 23  --  19* 28  GLUCOSE 95  --  118* 124*  BUN 20  --  20 19  CREATININE 0.82 0.84 0.96 0.88  CALCIUM 8.6*  --  8.5* 7.6*   GFR: Estimated Creatinine Clearance: 28.7 mL/min (by C-G formula based on SCr of 0.88 mg/dL). Liver Function Tests: Recent Labs  Lab 11/26/19 1544  AST 32  ALT 24  ALKPHOS 78  BILITOT 1.0  PROT 6.6  ALBUMIN 3.5   No results for input(s): LIPASE, AMYLASE in the last 168 hours. Recent Labs  Lab 11/27/19 1055  AMMONIA 44*   Coagulation Profile: Recent Labs  Lab 11/28/19 1543  INR 1.2   Cardiac Enzymes: No results for input(s): CKTOTAL, CKMB, CKMBINDEX,  TROPONINI in the last 168 hours. BNP (last 3 results) No results for input(s): PROBNP in the last 8760 hours. HbA1C: No results for input(s): HGBA1C in the last 72 hours. CBG: Recent Labs  Lab 11/26/19 1829  GLUCAP 94   Lipid Profile: No results for input(s): CHOL, HDL, LDLCALC, TRIG, CHOLHDL, LDLDIRECT in the last 72 hours. Thyroid Function Tests: No results for input(s):  TSH, T4TOTAL, FREET4, T3FREE, THYROIDAB in the last 72 hours. Anemia Panel: No results for input(s): VITAMINB12, FOLATE, FERRITIN, TIBC, IRON, RETICCTPCT in the last 72 hours. Urine analysis:    Component Value Date/Time   COLORURINE YELLOW 11/26/2019 1322   APPEARANCEUR CLEAR 11/26/2019 1322   LABSPEC >1.030 (H) 11/26/2019 1322   PHURINE 5.5 11/26/2019 1322   GLUCOSEU NEGATIVE 11/26/2019 1322   HGBUR SMALL (A) 11/26/2019 1322   BILIRUBINUR NEGATIVE 11/26/2019 1322   KETONESUR NEGATIVE 11/26/2019 1322   PROTEINUR NEGATIVE 11/26/2019 1322   UROBILINOGEN 0.2 06/17/2012 1645   NITRITE NEGATIVE 11/26/2019 1322   LEUKOCYTESUR TRACE (A) 11/26/2019 1322   Sepsis Labs: @LABRCNTIP (procalcitonin:4,lacticidven:4) ) Recent Results (from the past 240 hour(s))  Urine culture     Status: None   Collection Time: 11/26/19 12:58 PM   Specimen: Urine, Catheterized  Result Value Ref Range Status   Specimen Description URINE, CATHETERIZED  Final   Special Requests NONE  Final   Culture   Final    NO GROWTH Performed at [facility], [address], [place], [place]    Report Status 11/27/2019 FINAL  Final  Respiratory Panel by RT PCR (Flu A&B, Covid) - Nasopharyngeal Swab     Status: None   Collection Time: 11/26/19  5:06 PM   Specimen: Nasopharyngeal Swab  Result Value Ref Range Status   SARS Coronavirus 2 by RT PCR NEGATIVE NEGATIVE Final    Comment: (NOTE) SARS-CoV-2 target nucleic acids are NOT DETECTED.  The SARS-CoV-2 RNA is generally detectable in upper respiratoy specimens during the  acute phase of infection. The lowest concentration of SARS-CoV-2 viral copies this assay can detect is 131 copies/mL. A negative result does not preclude SARS-Cov-2 infection and should not be used as the sole basis for treatment or other patient management decisions. A negative result may occur with  improper specimen collection/handling, submission of specimen other than nasopharyngeal swab, presence of viral mutation(s) within the areas targeted by this assay, and inadequate number of viral copies (<131 copies/mL). A negative result must be combined with clinical observations, patient history, and epidemiological information. The expected result is Negative.  Fact Sheet for Patients:  11/28/19  Fact Sheet for Healthcare Providers:  https://www.moore.com/  This test is no t yet approved or cleared by the https://www.young.biz/ FDA and  has been authorized for detection and/or diagnosis of SARS-CoV-2 by FDA under an Emergency Use Authorization (EUA). This EUA will remain  in effect (meaning this test can be used) for the duration of the COVID-19 declaration under Section 564(b)(1) of the Act, 21 U.S.C. section 360bbb-3(b)(1), unless the authorization is terminated or revoked sooner.     Influenza A by PCR NEGATIVE NEGATIVE Final   Influenza B by PCR NEGATIVE NEGATIVE Final    Comment: (NOTE) The Xpert Xpress SARS-CoV-2/FLU/RSV assay is intended as an aid in  the diagnosis of influenza from Nasopharyngeal swab specimens and  should not be used as a sole basis for treatment. Nasal washings and  aspirates are unacceptable for Xpert Xpress SARS-CoV-2/FLU/RSV  testing.  Fact Sheet for Patients: [place]  Fact Sheet for Healthcare Providers: https://www.moore.com/  This test is not yet approved or cleared by the https://www.young.biz/ FDA and  has been authorized for detection and/or diagnosis  of SARS-CoV-2 by  FDA under an Emergency Use Authorization (EUA). This EUA will remain  in effect (meaning this test can be used) for the duration of the  Covid-19 declaration under Section 564(b)(1) of the  Act, 21  U.S.C. section 360bbb-3(b)(1), unless the authorization is  terminated or revoked. Performed at [facility], [address] [place], [place] [address]   Respiratory Panel by RT PCR (Flu A&B, Covid) - Nasopharyngeal Swab     Status: None   Collection Time: 11/28/19  4:04 PM   Specimen: Nasopharyngeal Swab  Result Value Ref Range Status   SARS Coronavirus 2 by RT PCR NEGATIVE NEGATIVE Final    Comment: (NOTE) SARS-CoV-2 target nucleic acids are NOT DETECTED.  The SARS-CoV-2 RNA is generally detectable in upper respiratoy specimens during the acute phase of infection. The lowest concentration of SARS-CoV-2 viral copies this assay can detect is 131 copies/mL. A negative result does not preclude SARS-Cov-2 infection and should not be used as the sole basis for treatment or other patient management decisions. A negative result may occur with  improper specimen collection/handling, submission of specimen other than nasopharyngeal swab, presence of viral mutation(s) within the areas targeted by this assay, and inadequate number of viral copies (<131 copies/mL). A negative result must be combined with clinical observations, patient history, and epidemiological information. The expected result is Negative.  Fact Sheet for Patients:  https://www.moore.com/  Fact Sheet for Healthcare Providers:  https://www.young.biz/  This test is no t yet approved or cleared by the [place] FDA and  has been authorized for detection and/or diagnosis of SARS-CoV-2 by FDA under an Emergency Use Authorization (EUA). This EUA will remain  in effect (meaning this test can be used) for the duration of the COVID-19 declaration under Section  564(b)(1) of the Act, 21 U.S.C. section 360bbb-3(b)(1), unless the authorization is terminated or revoked sooner.     Influenza A by PCR NEGATIVE NEGATIVE Final   Influenza B by PCR NEGATIVE NEGATIVE Final    Comment: (NOTE) The Xpert Xpress SARS-CoV-2/FLU/RSV assay is intended as an aid in  the diagnosis of influenza from Nasopharyngeal swab specimens and  should not be used as a sole basis for treatment. Nasal washings and  aspirates are unacceptable for Xpert Xpress SARS-CoV-2/FLU/RSV  testing.  Fact Sheet for Patients: https://www.moore.com/  Fact Sheet for Healthcare Providers: https://www.young.biz/  This test is not yet approved or cleared by the [place] FDA and  has been authorized for detection and/or diagnosis of SARS-CoV-2 by  FDA under an Emergency Use Authorization (EUA). This EUA will remain  in effect (meaning this test can be used) for the duration of the  Covid-19 declaration under Section 564(b)(1) of the Act, 21  U.S.C. section 360bbb-3(b)(1), unless the authorization is  terminated or revoked. Performed at [facility], [address], [place], [place] [address]          Radiology Studies: DG Knee 2 Views Right  Result Date: 11/28/2019 CLINICAL DATA:  Larey Seat.  Right hip pain. EXAM: RIGHT KNEE - 1-2 VIEW COMPARISON:  None. FINDINGS: The right knee joint is maintained. No acute fracture is identified. IMPRESSION: No acute bony findings. Electronically Signed   By: Rudie Meyer M.D.   On: 11/28/2019 14:51   DG C-Arm 1-60 Min  Result Date: 11/28/2019 CLINICAL DATA:  Status post intramedullary nail placement EXAM: DG C-ARM 1-60 MIN FLUOROSCOPY TIME:  Fluoroscopy Time:  1 minutes and 10 seconds Radiation Exposure Index (if provided by the fluoroscopic device): 8.1 mGy Number of Acquired Spot Images: 4 COMPARISON:  11/28/2019 FINDINGS: The patient has undergone intramedullary nail placement. There are expected  postsurgical changes. The alignment is improved. The hardware is intact.  IMPRESSION: Status post intramedullary nail placement. No evidence of hardware complication. Electronically Signed   By: Katherine Mantlehristopher  Green M.D.   On: 11/28/2019 20:23   DG Hip Unilat W or Wo Pelvis 2-3 Views Right  Result Date: 11/28/2019 CLINICAL DATA:  Larey SeatFell.  Right hip pain. EXAM: DG HIP (WITH OR WITHOUT PELVIS) 2-3V RIGHT COMPARISON:  None. FINDINGS: There is a displaced intertrochanteric fracture of the right hip. Both hips are normally located.  No pelvic fractures are identified. IMPRESSION: Displaced intertrochanteric fracture of the right hip. Electronically Signed   By: Rudie MeyerP.  Gallerani M.D.   On: 11/28/2019 14:50   DG FEMUR, MIN 2 VIEWS RIGHT  Result Date: 11/28/2019 CLINICAL DATA:  Status post intramedullary nail placement EXAM: RIGHT FEMUR 2 VIEWS COMPARISON:  11/28/2019 FINDINGS: The patient has undergone intramedullary nail placement. There are expected postsurgical changes. The alignment is improved. The hardware is intact. IMPRESSION: Status post intramedullary nail placement with improved alignment. No evidence of hardware complication. Electronically Signed   By: Katherine Mantlehristopher  Green M.D.   On: 11/28/2019 20:24   DG Femur Min 2 Views Right  Result Date: 11/28/2019 CLINICAL DATA:  Larey SeatFell.  Right hip pain. EXAM: RIGHT FEMUR 2 VIEWS COMPARISON:  None. FINDINGS: There is a displaced intertrochanteric fracture of the right hip. The right hip is normally located. No pelvic fractures are identified. IMPRESSION: Displaced intertrochanteric fracture of the right hip. Electronically Signed   By: Rudie MeyerP.  Gallerani M.D.   On: 11/28/2019 14:47      Scheduled Meds: . ALPRAZolam  0.5 mg Oral Daily  . darifenacin  7.5 mg Oral Daily  . docusate sodium  100 mg Oral BID  . donepezil  10 mg Oral QHS  . enoxaparin (LOVENOX) injection  30 mg Subcutaneous Q24H  . feeding supplement  237 mL Oral BID BM  . levETIRAcetam  250 mg Oral  BID  . levothyroxine  25 mcg Oral QAC breakfast  . loratadine  10 mg Oral Daily  . mouth rinse  15 mL Mouth Rinse BID  . memantine  10 mg Oral BID  . multivitamin with minerals  1 tablet Oral Q breakfast  . pantoprazole  40 mg Oral Daily  . QUEtiapine  50 mg Oral BID  . sertraline  25 mg Oral QHS  . traZODone  50 mg Oral QHS   Continuous Infusions:    LOS: 2 days      Calvert CantorSaima Keante Urizar, MD [facility] Pager: [email or web address] 11/30/2019, 2:30 PM

## 2019-11-30 NOTE — TOC Initial Note (Signed)
Transition of Care [facility]) - Initial/Assessment Note    Patient Details  Name: Mackenzie Peterson MRN: [id number] Date of Birth: Aug 08, 1924  Transition of Care [facility]) CM/SW Contact:    Lavon Paganini Phone Number: 11/30/2019, 10:43 AM  Clinical Narrative:                 CSW received consult for possible SNF placement at time of discharge. Patient not fully oriented, patient's son [name] present bedside. CSW informed son of PT recommendation for short-term SNF placement. Son is in agreement and provided permission for referrals to be faxed out to [facility]. CSW discussed insurance authorization process and provided Medicare SNF ratings list.  Expected Discharge Plan: Skilled Nursing Facility Barriers to Discharge: Continued Medical Work up   Patient Goals and CMS Choice   CMS Medicare.gov Compare Post Acute Care list provided to:: Patient Represenative (must comment) [name], son) Choice offered to / list presented to : Adult Children  Expected Discharge Plan and Services Expected Discharge Plan: Skilled Nursing Facility In-house Referral: Clinical Social Work   Post Acute Care Choice: Skilled Nursing Facility Living arrangements for the past 2 months: Single Family Home                                      Prior Living Arrangements/Services Living arrangements for the past 2 months: Single Family Home Lives with:: Relatives Patient language and need for interpreter reviewed:: Yes Do you feel safe going back to the place where you live?: Yes      Need for Family Participation in Patient Care: Yes (Comment) Care giver support system in place?: Yes (comment)   Criminal Activity/Legal Involvement Pertinent to Current Situation/Hospitalization: No - Comment as needed  Activities of Daily Living      Permission Sought/Granted Permission sought to share information with : Family Supports, Magazine features editor    Share Information with  NAME: [name]  Permission granted to share info w AGENCY: SNF  Permission granted to share info w Relationship: Son  Permission granted to share info w Contact Information: [phone]  Emotional Assessment   Attitude/Demeanor/Rapport: Unable to Assess Affect (typically observed): Unable to Assess Orientation: : Oriented to Self, Oriented to Situation Alcohol / Substance Use: Not Applicable Psych Involvement: No (comment)  Admission diagnosis:  Facial laceration, initial encounter [S01.81XA] Closed displaced intertrochanteric fracture of right femur, initial encounter (HCC) [S72.141A] Closed right hip fracture, initial encounter [facility]) [S72.001A] Patient Active Problem List   Diagnosis Date Noted  . Intertrochanteric fracture of right femur (HCC) 11/28/2019  . Closed right hip fracture, initial encounter (HCC) 11/28/2019  . Delirium 11/26/2019  . Chronic respiratory failure due to ILD NOS (sept 2011 CT suggesive of Hypersensitivity Pneumonitis), Reports of occupational exposure in 53s in [place], [place] 01/16/2012  . Dementia (HCC) 01/06/2012  . Pneumonia 01/06/2012  . Leukocytosis 01/05/2012  . Abdominal pain 01/05/2012  . Hypothyroidism 10/26/2009  . Depression 10/26/2009  . Mild persistent chronic asthma without complication 10/26/2009  . SHORTNESS OF BREATH (SOB) 10/26/2009  . COUGH 10/26/2009   PCP:  [name], MD Pharmacy:   [id number] - [place], [place] - [address] [place] [address] Phone: [phone] Fax: [phone]     Social Determinants of Health (SDOH) Interventions    Readmission Risk Interventions No  flowsheet data found.

## 2019-11-30 NOTE — Progress Notes (Signed)
No further seizures.  She is awake, visual fields full, follows commands.  By description, the events of 27th October are certainly most consistent with seizure.  Even with negative EEG, I would favor continuing her on Keppra 250mg  bid indefinitely as long as she continues to tolerate it.  No further inpatient recommendations at this time.  She could follow-up as an outpatient with neurology.  Please call with any further questions or concerns.  , MD [facility] [phone]  If 7pm- 7am, please page neurology on call as listed in AMION.

## 2019-11-30 NOTE — Progress Notes (Signed)
Subjective: 2 Days Post-Op Procedure(s) (LRB): INTRAMEDULLARY (IM) NAIL INTERTROCHANTRIC (Right) Patient reports pain as mild.    Objective: Vital signs in last 24 hours: Temp:  [97.5 F (36.4 C)-98.2 F (36.8 C)] 97.5 F (36.4 C) (10/31 0400) Pulse Rate:  [76-79] 76 (10/30 1400) Resp:  [12-21] 12 (10/31 0400) BP: (105-126)/(38-43) 108/43 (10/31 0900) SpO2:  [97 %-100 %] 97 % (10/31 0900) Weight:  [48.1 kg] 48.1 kg (10/31 0400)  Intake/Output from previous day: 10/30 0701 - 10/31 0700 In: 799.5 [P.O.:240; I.V.:553.7; IV Piggyback:5.9] Out: 510 [Urine:510] Intake/Output this shift: Total I/O In: 240 [P.O.:240] Out: 0   Recent Labs    11/28/19 1543 11/29/19 1149  HGB 13.7 8.8*   Recent Labs    11/28/19 1543 11/29/19 1149  WBC 19.7* 11.5*  RBC 4.46 2.79*  HCT 42.0 26.6*  PLT 243 148*   Recent Labs    11/28/19 1543  NA 142  K 3.7  CL 109  CO2 19*  BUN 20  CREATININE 0.96  GLUCOSE 118*  CALCIUM 8.5*   Recent Labs    11/28/19 1543  INR 1.2    PE:  elderly HOH female in nad.  R hip dressed and dry.  R foot with palpable pulses.   Assessment/Plan: 2 Days Post-Op Procedure(s) (LRB): INTRAMEDULLARY (IM) NAIL INTERTROCHANTRIC (Right) Up with therapy.  WBAT R LE.     Toni Arthurs 11/30/2019, 9:25 AM

## 2019-11-30 NOTE — [place] FL2 (Signed)
[place] MEDICAID FL2 LEVEL OF CARE SCREENING TOOL     IDENTIFICATION  Patient Name: Mackenzie Peterson Birthdate: August 02, 1924 Sex: female Admission Date (Current Location): 11/28/2019  [facility] and [place] Number:  Producer, television/film/video and Address:  [facility], [address] [place], [place] [address]      Provider Number: [id number]  Attending Physician Name and Address:  Calvert Cantor, MD  Relative Name and Phone Number:  [name]    Current Level of Care: Hospital Recommended Level of Care: Skilled Nursing Facility Prior Approval Number:    Date Approved/Denied:   PASRR Number: [id number]  Discharge Plan: SNF    Current Diagnoses: Patient Active Problem List   Diagnosis Date Noted  . Intertrochanteric fracture of right femur (HCC) 11/28/2019  . Closed right hip fracture, initial encounter (HCC) 11/28/2019  . Delirium 11/26/2019  . 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 Dementia (HCC) 01/06/2012  . Pneumonia 01/06/2012  . Leukocytosis 01/05/2012  . Abdominal pain 01/05/2012  . Hypothyroidism 10/26/2009  . Depression 10/26/2009  . Mild persistent chronic asthma without complication 10/26/2009  . SHORTNESS OF BREATH (SOB) 10/26/2009  . COUGH 10/26/2009    Orientation RESPIRATION BLADDER Height & Weight     Self, Time  O2 (Nasal Cannula) Incontinent, External catheter Weight: 106 lb 0.7 oz (48.1 kg) Height:  5' (152.4 cm)  BEHAVIORAL SYMPTOMS/MOOD NEUROLOGICAL BOWEL NUTRITION STATUS      Continent Diet (See discharge summary)  AMBULATORY STATUS COMMUNICATION OF NEEDS Skin   Limited Assist Verbally Normal                       Personal Care Assistance Level of Assistance  Bathing, Feeding, Dressing Bathing Assistance: Limited assistance Feeding assistance: Limited assistance Dressing  Assistance: Limited assistance     Functional Limitations Info  Sight, Speech, Hearing Sight Info: Adequate Hearing Info: Adequate Speech Info: Adequate    SPECIAL CARE FACTORS FREQUENCY  PT (By licensed PT), OT (By licensed OT)     PT Frequency: 5x a week OT Frequency: 5x a week            Contractures Contractures Info: Not present    Additional Factors Info  Code Status, Allergies Code Status Info: Full Allergies Info: NKA           Current Medications (11/30/2019):  This is the current hospital active medication list Current Facility-Administered Medications  Medication Dose Route Frequency Provider Last Rate Last Admin  . acetaminophen (TYLENOL) tablet 500-1,000 mg  500-1,000 mg Oral Q6H PRN Yolonda Kida, MD      . albuterol (PROVENTIL) (2.5 MG/3ML) 0.083% nebulizer solution 2.5 mg  2.5 mg Nebulization TID PRN Yolonda Kida, MD      . ALPRAZolam Prudy Feeler) tablet 0.5 mg  0.5 mg Oral Daily Calvert Cantor, MD   0.5 mg at 11/30/19 0907  . darifenacin (ENABLEX) 24 hr tablet 7.5 mg  7.5 mg Oral Daily Yolonda Kida, MD   7.5 mg at 11/29/19 2207  . docusate sodium (COLACE) capsule 100 mg  100 mg Oral BID Yolonda Kida, MD   100 mg at 11/30/19 0908  . donepezil (ARICEPT) tablet 10 mg  10 mg Oral QHS Yolonda Kida, MD   10 mg at 11/29/19 2208  . enoxaparin (LOVENOX) injection 30 mg  30 mg  Subcutaneous Q24H Yolonda Kida, MD   30 mg at 11/30/19 5701  . feeding supplement (ENSURE ENLIVE / ENSURE PLUS) liquid 237 mL  237 mL Oral BID BM Calvert Cantor, MD   237 mL at 11/30/19 0907  . HYDROcodone-acetaminophen (NORCO/VICODIN) 5-325 MG per tablet 1-2 tablet  1-2 tablet Oral Q6H PRN Yolonda Kida, MD   2 tablet at 11/29/19 1749  . levETIRAcetam (KEPPRA) tablet 250 mg  250 mg Oral BID Rejeana Brock, MD   250 mg at 11/29/19 2208  . levothyroxine (SYNTHROID) tablet 25 mcg  25 mcg Oral QAC breakfast Yolonda Kida, MD    25 mcg at 11/30/19 0523  . loratadine (CLARITIN) tablet 10 mg  10 mg Oral Daily Yolonda Kida, MD   10 mg at 11/30/19 0908  . MEDLINE mouth rinse  15 mL Mouth Rinse BID Calvert Cantor, MD      . memantine [facility]) tablet 10 mg  10 mg Oral BID Yolonda Kida, MD   10 mg at 11/30/19 0908  . menthol-cetylpyridinium (CEPACOL) lozenge 3 mg  1 lozenge Oral PRN Yolonda Kida, MD       Or  . phenol [facility]) mouth spray 1 spray  1 spray Mouth/Throat PRN Yolonda Kida, MD      . metoCLOPramide [facility]) tablet 5-10 mg  5-10 mg Oral Q8H PRN Yolonda Kida, MD       Or  . metoCLOPramide [facility]) injection 5-10 mg  5-10 mg Intravenous Q8H PRN Yolonda Kida, MD      . morphine 2 MG/ML injection 0.5 mg  0.5 mg Intravenous Q2H PRN Yolonda Kida, MD   0.5 mg at 11/29/19 0243  . multivitamin with minerals tablet 1 tablet  1 tablet Oral Q breakfast Yolonda Kida, MD   1 tablet at 11/30/19 0908  . nitroGLYCERIN (NITROSTAT) SL tablet 0.4 mg  0.4 mg Sublingual Q5 min PRN Yolonda Kida, MD      . ondansetron [facility]) tablet 4 mg  4 mg Oral Q6H PRN Yolonda Kida, MD       Or  . ondansetron [facility]) injection 4 mg  4 mg Intravenous Q6H PRN Yolonda Kida, MD      . pantoprazole (PROTONIX) EC tablet 40 mg  40 mg Oral Daily Yolonda Kida, MD   40 mg at 11/30/19 7793  . QUEtiapine (SEROQUEL) tablet 50 mg  50 mg Oral BID Yolonda Kida, MD   50 mg at 11/30/19 0908  . sertraline (ZOLOFT) tablet 25 mg  25 mg Oral QHS Yolonda Kida, MD   25 mg at 11/29/19 2208  . traZODone (DESYREL) tablet 50 mg  50 mg Oral QHS Calvert Cantor, MD   50 mg at 11/29/19 2208     Discharge Medications: Please see discharge summary for a list of discharge medications.  Relevant Imaging Results:  Relevant Lab Results:   Additional Information SSN [id number]  Cleda Clarks, [place]

## 2019-12-01 ENCOUNTER — Encounter (HOSPITAL_COMMUNITY): Payer: Self-pay | Admitting: Orthopedic Surgery

## 2019-12-01 ENCOUNTER — Inpatient Hospital Stay (HOSPITAL_COMMUNITY): Payer: Medicare Other

## 2019-12-01 DIAGNOSIS — E860 Dehydration: Secondary | ICD-10-CM

## 2019-12-01 DIAGNOSIS — S72141A Displaced intertrochanteric fracture of right femur, initial encounter for closed fracture: Secondary | ICD-10-CM | POA: Diagnosis not present

## 2019-12-01 DIAGNOSIS — S72144A Nondisplaced intertrochanteric fracture of right femur, initial encounter for closed fracture: Secondary | ICD-10-CM | POA: Diagnosis not present

## 2019-12-01 DIAGNOSIS — G309 Alzheimer's disease, unspecified: Secondary | ICD-10-CM | POA: Diagnosis not present

## 2019-12-01 DIAGNOSIS — E039 Hypothyroidism, unspecified: Secondary | ICD-10-CM | POA: Diagnosis not present

## 2019-12-01 DIAGNOSIS — E876 Hypokalemia: Secondary | ICD-10-CM

## 2019-12-01 DIAGNOSIS — R197 Diarrhea, unspecified: Secondary | ICD-10-CM

## 2019-12-01 LAB — BASIC METABOLIC PANEL
Anion gap: 8 (ref 5–15)
Anion gap: 6 (ref 5–15)
BUN: 23 mg/dL (ref 8–23)
BUN: 23 mg/dL (ref 8–23)
CO2: 25 mmol/L (ref 22–32)
CO2: 25 mmol/L (ref 22–32)
Calcium: 7.9 mg/dL — ABNORMAL LOW (ref 8.9–10.3)
Calcium: 7.4 mg/dL — ABNORMAL LOW (ref 8.9–10.3)
Chloride: 108 mmol/L (ref 98–111)
Chloride: 109 mmol/L (ref 98–111)
Creatinine, Ser: 0.85 mg/dL (ref 0.44–1.00)
Creatinine, Ser: 0.74 mg/dL (ref 0.44–1.00)
GFR, Estimated: >60 mL/min (ref >60)
GFR, Estimated: >60 mL/min (ref >60)
Glucose, Bld: 121 mg/dL — ABNORMAL HIGH (ref 70–99)
Glucose, Bld: 108 mg/dL — ABNORMAL HIGH (ref 70–99)
Potassium: 2.6 mmol/L — CL (ref 3.5–5.1)
Potassium: 4 mmol/L (ref 3.5–5.1)
Sodium: 141 mmol/L (ref 135–145)
Sodium: 140 mmol/L (ref 135–145)

## 2019-12-01 LAB — CBC
HCT: 32.5 % — ABNORMAL LOW (ref 36.0–46.0)
Hemoglobin: 11.2 g/dL — ABNORMAL LOW (ref 12.0–15.0)
MCH: 31.7 pg (ref 26.0–34.0)
MCHC: 34.5 g/dL (ref 30.0–36.0)
MCV: 92.1 fL (ref 80.0–100.0)
Platelets: 147 10*3/uL — ABNORMAL LOW (ref 150–400)
RBC: 3.53 MIL/uL — ABNORMAL LOW (ref 3.87–5.11)
RDW: 14.3 % (ref 11.5–15.5)
WBC: 12.4 10*3/uL — ABNORMAL HIGH (ref 4.0–10.5)
nRBC: 0.4 % — ABNORMAL HIGH (ref 0.0–0.2)

## 2019-12-01 LAB — BPAM RBC
Blood Product Expiration Date: 202111192359
ISSUE DATE / TIME: 202110311551
Unit Type and Rh: 7300

## 2019-12-01 LAB — TYPE AND SCREEN
ABO/RH(D): B POS
Antibody Screen: NEGATIVE
Unit division: 0

## 2019-12-01 LAB — MAGNESIUM: Magnesium: 1.9 mg/dL (ref 1.7–2.4)

## 2019-12-01 MED ORDER — POTASSIUM CHLORIDE 10 MEQ/100ML IV SOLN
10.0000 meq | INTRAVENOUS | Status: AC
  Administered 2019-12-01 (×3): 10 meq via INTRAVENOUS
  Filled 2019-12-01 (×3): qty 100

## 2019-12-01 MED ORDER — POTASSIUM CHLORIDE IN NACL 20-0.9 MEQ/L-% IV SOLN
INTRAVENOUS | Status: DC
  Filled 2019-12-01 (×3): qty 1000

## 2019-12-01 MED ORDER — TRAMADOL HCL 50 MG PO TABS
50.0000 mg | ORAL_TABLET | Freq: Four times a day (QID) | ORAL | 0 refills | Status: DC | PRN
Start: 2019-12-01 — End: 2019-12-04

## 2019-12-01 NOTE — Progress Notes (Signed)
   Subjective: Patient is a 86 year old female 3 days s/p right IM Hip nailing by Dr. Aundria Rud.  Patient reports pain as moderate at the hip. Patient just awoke from sleep. She has been up with therapy yesterday.  Objective:   VITALS:   Vitals:   11/30/19 1616 11/30/19 1839 11/30/19 2016 12/01/19 0053  BP:  (!) 141/51 (!) 141/57   Pulse:  99 (!) 106   Resp:  20 18   Temp: 98.8 F (37.1 C) 98.6 F (37 C) 99.2 F (37.3 C)   TempSrc: Oral Oral Oral   SpO2:  94% 93%   Weight:    48.8 kg  Height:       General: No acute distress, patient resting comfortably, responding to commands  Right Lower Extremity:  Neurologically intact Sensation intact distally Intact pulses distally Dorsiflexion/Plantar flexion intact Incision: dressing C/D/I  TTP right hip No calf pain  Able to move all her toes. No bleeding noted on her dressings.   Lab Results  Component Value Date   WBC 12.4 (H) 12/01/2019   HGB 11.2 (L) 12/01/2019   HCT 32.5 (L) 12/01/2019   MCV 92.1 12/01/2019   PLT 147 (L) 12/01/2019   BMET    Component Value Date/Time   NA 138 11/30/2019 0906   K 3.8 11/30/2019 0906   CL 105 11/30/2019 0906   CO2 28 11/30/2019 0906   GLUCOSE 124 (H) 11/30/2019 0906   BUN 19 11/30/2019 0906   CREATININE 0.88 11/30/2019 0906   CALCIUM 7.6 (L) 11/30/2019 0906   GFRNONAA >60 11/30/2019 0906   GFRAA >60 04/30/2019 1850     Assessment/Plan: 3 Days Post-Op   Principal Problem:   Intertrochanteric fracture of right femur (HCC) Active Problems:   Hypothyroidism   Mild persistent chronic asthma without complication   Delirium   Closed right hip fracture, initial encounter (HCC)  Dressing reamins intact. maintain dressing until follow up in 2 weeks unless saturated, change PRN if needed.  Advance diet Up with therapy Weightbearing Status: WBAT to the right lower extremity DVT Prophylaxis: Lovenox while inpatient,  81mg  aspirin twice a day for 6 weeks upon discharge Discharge to  SNF per primary Pain : tramadol Rx printed for discharge to SNF  Return to [facility] in 2 weeks for staple removal, wound check, and x-rays with [facility] or Aundria Rud PA-C [id number]  [phone] 12/01/2019, 7:21 AM  13/01/2019 PA-C  Physician Assistant with Dr. Dion Saucier [place]

## 2019-12-01 NOTE — Progress Notes (Signed)
CRITICAL VALUE ALERT  Critical value received:  Potassium 2.6  Date of notification:  12/01/19  Time of notification: 11:40 am  MD notified (1st page):  Dr. Calvert Cantor   Time of first page:  11:40 AM

## 2019-12-01 NOTE — Discharge Instructions (Signed)
Orthopedic discharge instructions:  -Okay for full weightbearing as tolerated to the right lower extremity  -Maintain postoperative bandage until your follow-up appointment.  If this bandage becomes saturated you may remove it and change it as needed on a daily basis.  -You may begin showering on the second day from surgery.  Do not submerge underwater however until at least after your follow-up appointment.  -For the prevention of blood clots take an 81 mg aspirin twice per day for the next 6 weeks.  -Apply ice to the right hip for 20 to 30 minutes/h during the day.  -For mild to moderate pain use Tylenol and Advil around-the-clock.  For any breakthrough pain use tramadol as necessary.  -Return to see Dr. Aundria Rud in 2 weeks for routine postoperative care. [phone]

## 2019-12-01 NOTE — Progress Notes (Signed)
No episodes of loose stool since 1200 , will continue to monitor

## 2019-12-01 NOTE — Progress Notes (Signed)
PROGRESS NOTE    Mackenzie Peterson   RUE:[id number]  DOB: February 17, 1924  DOA: 11/28/2019 PCP: [name], MD   Brief Narrative:  Mackenzie Peterson  is a 86 y.o. mostly spanish speaking female with medical history significant of alzheimer's dementia, HTN, Hypothyroidism, PVD seen in ed for Fall and rt hip fracture. She had a hospital stay from 10/27-10/28 for confusion thought to be related to polypharmacy. She returned to baseline the following day and was discharged.  She fell at home (unwitnessed) and sustained a right hip fracture and a hematoma to the right eye.    Subjective: Per RN, she is have many episodes of diarrhea. The patient states she is not hungry. Per RN, she is not eating or drinking. Maybe had some sips of ensure yesterday. She has no nausea and no vomiting but has some abdominal pain and points to RLQ to show where it is.   Assessment & Plan:   Principal Problem:   Intertrochanteric fracture of right femur - s/p IM nail - on Lovenox for DVT prophylaxis - PT recommending SNF  Active Problems:  Diarrhea - severe, at least 5-6 episodes of watery stool since the night- she is sitting in watery stool at this time - mild tenderness in RLQ, no distension and normal bowel sounds - xray abdomen unrevealing, WBC 12.4 but this was 19.7 when first admitted- no fevers - start IVF, stop Colace and Ensure - check stool for lactoferrin and check a GI pathogen panel  Hypokalemia - due to diarrhea and poor oral intake - start maintenance fluids with K - add 40 meq IV K -check Mg> 1.9 -cont to follow on telemetry  Dementia with sundowning (per son) - son states her current level of confusion (thinking she is in [place] and not knowing the month or year) is baseline - on Aricept and Namenda - follow for sundowning- none noted so far daughter in law give her meds _ cont Seroquel BID, Trazodone and Zoloft QHS and follow   - she receives Xanax daily in the AM and 1-2 in  evenings- will continue with 1 tab BID in hospital    Anemia and thrombocytopenia - Baseline Hb seems to range from 10-15 - Hb 13.7 on admission > 8.8 by 10/30> 7.1- transfused 1 U PRBC 10/31- - possibly due to acute blood loss from fracture - Hb now 11.2 but likely high due to hemoconcentration  Change in mental status on 10/27 - suspected to be polypharmacy - I've spoken with the son who described episode of staring and repeating word and an episode of "stiffing of her body and gurgling" prior to the MRI and followed by "no speaking" for almost an entire day and by 11 PM the following day she suddenly was back to her baseline - MRI was negative- - EEG noted below did not show epileptiform changes - Neuro eval requested- started on Keppra 250 BID for probable seizures    Hypothyroidism - Cont Synthroid    Mild persistent chronic asthma without complication - albuterol PRN    GERD - on PPI  Hyperlipidemia - Pravachol       Time spent in minutes: 35 DVT prophylaxis: enoxaparin (LOVENOX) injection 30 mg Start: 11/29/19 0800 SCDs Start: 11/28/19 2254 SCDs Start: 11/28/19 1632  Code Status: Full code Family Communication: son, [name] Disposition Plan:  Status is: Inpatient  Remains inpatient appropriate because:  Treat dehydration, hypokalemia   Dispo: The patient is from: Home  Anticipated d/c is to: SNF              Anticipated d/c date is: > 3 days              Patient currently is medically stable to d/c.      Consultants:   Ortho Procedures:  Treatment of intertrochanteric, pertrochanteric, subtrochanteric fracture with intramedullary implant EEG:  This study is suggestive of mild diffuse encephalopathy, nonspecific etiology. No seizures or epileptiform discharges were seen throughout the recording.   Antimicrobials:  Anti-infectives (From admission, onward)   Start     Dose/Rate Route Frequency Ordered Stop   11/29/19 0600  ceFAZolin  (ANCEF) IVPB 2g/100 mL premix        2 g 200 mL/hr over 30 Minutes Intravenous On call to O.R. 11/28/19 1758 11/28/19 1900   11/29/19 0000  ceFAZolin (ANCEF) IVPB 2g/100 mL premix        2 g 200 mL/hr over 30 Minutes Intravenous Every 6 hours 11/28/19 2253 11/29/19 0722   11/28/19 1759  ceFAZolin (ANCEF) 2-4 GM/100ML-% IVPB       Note to Pharmacy: Aquilla Hacker   : cabinet override      11/28/19 1759 11/28/19 1911       Objective: Vitals:   12/01/19 0840 12/01/19 1125 12/01/19 1222 12/01/19 1400  BP: (!) 108/39  (!) 124/46 (!) 111/43  Pulse: 88   89  Resp: 20  18 17   Temp: 97.6 F (36.4 C) 98.1 F (36.7 C)    TempSrc: Oral Oral    SpO2: 94% 98% 97% 98%  Weight:      Height:        Intake/Output Summary (Last 24 hours) at 12/01/2019 1503 Last data filed at 12/01/2019 1401 Gross per 24 hour  Intake 950.63 ml  Output 0 ml  Net 950.63 ml   Filed Weights   11/28/19 2213 11/30/19 0400 12/01/19 0053  Weight: 46.3 kg 48.1 kg 48.8 kg    Examination: General exam: Appears comfortable - very listless today HEENT: PERRLA, oral mucosa moist, no sclera icterus or thrush Respiratory system: Clear to auscultation. Respiratory effort normal. Cardiovascular system: S1 & S2 heard,  No murmurs  Gastrointestinal system: Abdomen soft, mild tenderness in RLQ, nondistended. Normal bowel sounds   Central nervous system: Alert and oriented to person. No focal neurological deficits. Extremities: No cyanosis, clubbing or edema Skin: No rashes or ulcers Psychiatry:  looks depressed.    Data Reviewed: I have personally reviewed following labs and imaging studies  CBC: Recent Labs  Lab 11/26/19 1544 11/26/19 1544 11/26/19 1829 11/26/19 1829 11/28/19 1543 11/29/19 1149 11/30/19 0906 11/30/19 2128 12/01/19 0457  WBC 10.5   < > 9.7  --  19.7* 11.5* 10.2  --  12.4*  NEUTROABS 8.1*  --   --   --  17.4*  --   --   --   --   HGB 15.7*   < > 15.9*   < > 13.7 8.8* 7.1* 10.6* 11.2*  HCT  47.9*   < > 47.6*   < > 42.0 26.6* 21.6* 31.5* 32.5*  MCV 92.5   < > 91.5  --  94.2 95.3 93.9  --  92.1  PLT 225   < > 242  --  243 148* 127*  --  147*   < > = values in this interval not displayed.   Basic Metabolic Panel: Recent Labs  Lab 11/26/19 1544 11/26/19 1829 11/28/19 1543 11/30/19 0906 12/01/19  0457  NA 139  --  142 138 141  K 4.0  --  3.7 3.8 2.6*  CL 104  --  109 105 108  CO2 23  --  19* 28 25  GLUCOSE 95  --  118* 124* 121*  BUN 20  --  20 19 23   CREATININE 0.82 0.84 0.96 0.88 0.85  CALCIUM 8.6*  --  8.5* 7.6* 7.9*  MG  --   --   --   --  1.9   GFR: Estimated Creatinine Clearance: 29.7 mL/min (by C-G formula based on SCr of 0.85 mg/dL). Liver Function Tests: Recent Labs  Lab 11/26/19 1544  AST 32  ALT 24  ALKPHOS 78  BILITOT 1.0  PROT 6.6  ALBUMIN 3.5   No results for input(s): LIPASE, AMYLASE in the last 168 hours. Recent Labs  Lab 11/27/19 1055  AMMONIA 44*   Coagulation Profile: Recent Labs  Lab 11/28/19 1543  INR 1.2   Cardiac Enzymes: No results for input(s): CKTOTAL, CKMB, CKMBINDEX, TROPONINI in the last 168 hours. BNP (last 3 results) No results for input(s): PROBNP in the last 8760 hours. HbA1C: No results for input(s): HGBA1C in the last 72 hours. CBG: Recent Labs  Lab 11/26/19 1829  GLUCAP 94   Lipid Profile: No results for input(s): CHOL, HDL, LDLCALC, TRIG, CHOLHDL, LDLDIRECT in the last 72 hours. Thyroid Function Tests: No results for input(s): TSH, T4TOTAL, FREET4, T3FREE, THYROIDAB in the last 72 hours. Anemia Panel: No results for input(s): VITAMINB12, FOLATE, FERRITIN, TIBC, IRON, RETICCTPCT in the last 72 hours. Urine analysis:    Component Value Date/Time   COLORURINE YELLOW 11/26/2019 1322   APPEARANCEUR CLEAR 11/26/2019 1322   LABSPEC >1.030 (H) 11/26/2019 1322   PHURINE 5.5 11/26/2019 1322   GLUCOSEU NEGATIVE 11/26/2019 1322   HGBUR SMALL (A) 11/26/2019 1322   BILIRUBINUR NEGATIVE 11/26/2019 1322    KETONESUR NEGATIVE 11/26/2019 1322   PROTEINUR NEGATIVE 11/26/2019 1322   UROBILINOGEN 0.2 06/17/2012 1645   NITRITE NEGATIVE 11/26/2019 1322   LEUKOCYTESUR TRACE (A) 11/26/2019 1322   Sepsis Labs: @LABRCNTIP (procalcitonin:4,lacticidven:4) ) Recent Results (from the past 240 hour(s))  Urine culture     Status: None   Collection Time: 11/26/19 12:58 PM   Specimen: Urine, Catheterized  Result Value Ref Range Status   Specimen Description URINE, CATHETERIZED  Final   Special Requests NONE  Final   Culture   Final    NO GROWTH Performed at [facility], [address] [place], [address] [place]    Report Status 11/27/2019 FINAL  Final  Respiratory Panel by RT PCR (Flu A&B, Covid) - Nasopharyngeal Swab     Status: None   Collection Time: 11/26/19  5:06 PM   Specimen: Nasopharyngeal Swab  Result Value Ref Range Status   SARS Coronavirus 2 by RT PCR NEGATIVE NEGATIVE Final    Comment: (NOTE) SARS-CoV-2 target nucleic acids are NOT DETECTED.  The SARS-CoV-2 RNA is generally detectable in upper respiratoy specimens during the acute phase of infection. The lowest concentration of SARS-CoV-2 viral copies this assay can detect is 131 copies/mL. A negative result does not preclude SARS-Cov-2 infection and should not be used as the sole basis for treatment or other patient management decisions. A negative result may occur with  improper specimen collection/handling, submission of specimen other than nasopharyngeal swab, presence of viral mutation(s) within the areas targeted by this assay, and inadequate number of viral copies (<131 copies/mL). A negative result must be combined with  clinical observations, patient history, and epidemiological information. The expected result is Negative.  Fact Sheet for Patients:  https://www.moore.com/  Fact Sheet for Healthcare Providers:  https://www.young.biz/  This test is no t yet approved or  cleared by the [place] FDA and  has been authorized for detection and/or diagnosis of SARS-CoV-2 by FDA under an Emergency Use Authorization (EUA). This EUA will remain  in effect (meaning this test can be used) for the duration of the COVID-19 declaration under Section 564(b)(1) of the Act, 21 U.S.C. section 360bbb-3(b)(1), unless the authorization is terminated or revoked sooner.     Influenza A by PCR NEGATIVE NEGATIVE Final   Influenza B by PCR NEGATIVE NEGATIVE Final    Comment: (NOTE) The Xpert Xpress SARS-CoV-2/FLU/RSV assay is intended as an aid in  the diagnosis of influenza from Nasopharyngeal swab specimens and  should not be used as a sole basis for treatment. Nasal washings and  aspirates are unacceptable for Xpert Xpress SARS-CoV-2/FLU/RSV  testing.  Fact Sheet for Patients: https://www.moore.com/  Fact Sheet for Healthcare Providers: https://www.young.biz/  This test is not yet approved or cleared by the [place] FDA and  has been authorized for detection and/or diagnosis of SARS-CoV-2 by  FDA under an Emergency Use Authorization (EUA). This EUA will remain  in effect (meaning this test can be used) for the duration of the  Covid-19 declaration under Section 564(b)(1) of the Act, 21  U.S.C. section 360bbb-3(b)(1), unless the authorization is  terminated or revoked. Performed at [facility], [address] [place], [place] [address]   Respiratory Panel by RT PCR (Flu A&B, Covid) - Nasopharyngeal Swab     Status: None   Collection Time: 11/28/19  4:04 PM   Specimen: Nasopharyngeal Swab  Result Value Ref Range Status   SARS Coronavirus 2 by RT PCR NEGATIVE NEGATIVE Final    Comment: (NOTE) SARS-CoV-2 target nucleic acids are NOT DETECTED.  The SARS-CoV-2 RNA is generally detectable in upper respiratoy specimens during the acute phase of infection. The lowest concentration of SARS-CoV-2 viral copies this  assay can detect is 131 copies/mL. A negative result does not preclude SARS-Cov-2 infection and should not be used as the sole basis for treatment or other patient management decisions. A negative result may occur with  improper specimen collection/handling, submission of specimen other than nasopharyngeal swab, presence of viral mutation(s) within the areas targeted by this assay, and inadequate number of viral copies (<131 copies/mL). A negative result must be combined with clinical observations, patient history, and epidemiological information. The expected result is Negative.  Fact Sheet for Patients:  https://www.moore.com/  Fact Sheet for Healthcare Providers:  https://www.young.biz/  This test is no t yet approved or cleared by the [place] FDA and  has been authorized for detection and/or diagnosis of SARS-CoV-2 by FDA under an Emergency Use Authorization (EUA). This EUA will remain  in effect (meaning this test can be used) for the duration of the COVID-19 declaration under Section 564(b)(1) of the Act, 21 U.S.C. section 360bbb-3(b)(1), unless the authorization is terminated or revoked sooner.     Influenza A by PCR NEGATIVE NEGATIVE Final   Influenza B by PCR NEGATIVE NEGATIVE Final    Comment: (NOTE) The Xpert Xpress SARS-CoV-2/FLU/RSV assay is intended as an aid in  the diagnosis of influenza from Nasopharyngeal swab specimens and  should not be used as a sole basis for treatment. Nasal washings and  aspirates are unacceptable for Xpert Xpress SARS-CoV-2/FLU/RSV  testing.  Fact Sheet for Patients: https://www.moore.com/https://www.fda.gov/media/142436/download  Fact Sheet for Healthcare Providers: https://www.young.biz/https://www.fda.gov/media/142435/download  This test is not yet approved or cleared by the [place] FDA and  has been authorized for detection and/or diagnosis of SARS-CoV-2 by  FDA under an Emergency Use Authorization (EUA). This EUA will  remain  in effect (meaning this test can be used) for the duration of the  Covid-19 declaration under Section 564(b)(1) of the Act, 21  U.S.C. section 360bbb-3(b)(1), unless the authorization is  terminated or revoked. Performed at [facility], [address] [place], [place] [address]          Radiology Studies: DG Abd Portable 2V  Result Date: 12/01/2019 CLINICAL DATA:  Diarrhea. EXAM: PORTABLE ABDOMEN - 2 VIEW COMPARISON:  None. FINDINGS: The bowel gas pattern is normal. There is no evidence of free air. No radio-opaque calculi or other significant radiographic abnormality is seen. IMPRESSION: Negative. Electronically Signed   By: Lupita RaiderJames  Green Jr M.D.   On: 12/01/2019 10:14      Scheduled Meds: . ALPRAZolam  0.5 mg Oral Daily  . darifenacin  7.5 mg Oral Daily  . donepezil  10 mg Oral QHS  . enoxaparin (LOVENOX) injection  30 mg Subcutaneous Q24H  . levETIRAcetam  250 mg Oral BID  . levothyroxine  25 mcg Oral QAC breakfast  . loratadine  10 mg Oral Daily  . mouth rinse  15 mL Mouth Rinse BID  . memantine  10 mg Oral BID  . multivitamin with minerals  1 tablet Oral Q breakfast  . pantoprazole  40 mg Oral Daily  . pravastatin  20 mg Oral QHS  . QUEtiapine  50 mg Oral BID  . sertraline  25 mg Oral QHS  . traZODone  50 mg Oral QHS   Continuous Infusions: . 0.9 % NaCl with KCl 20 mEq / L 75 mL/hr at 12/01/19 1357  . potassium chloride 10 mEq (12/01/19 1400)     LOS: 3 days      Calvert CantorSaima Kortland Nichols, MD [facility] Pager: [email or web address] 12/01/2019, 3:03 PM

## 2019-12-01 NOTE — Progress Notes (Signed)
Unable to collect stool so far. Every time stool was watery and mixed with urine. Will keep trying.

## 2019-12-02 DIAGNOSIS — S72144A Nondisplaced intertrochanteric fracture of right femur, initial encounter for closed fracture: Secondary | ICD-10-CM | POA: Diagnosis not present

## 2019-12-02 LAB — CBC
HCT: 27.8 % — ABNORMAL LOW (ref 36.0–46.0)
Hemoglobin: 9.5 g/dL — ABNORMAL LOW (ref 12.0–15.0)
MCH: 31.7 pg (ref 26.0–34.0)
MCHC: 34.2 g/dL (ref 30.0–36.0)
MCV: 92.7 fL (ref 80.0–100.0)
Platelets: 158 10*3/uL (ref 150–400)
RBC: 3 MIL/uL — ABNORMAL LOW (ref 3.87–5.11)
RDW: 14.2 % (ref 11.5–15.5)
WBC: 14.8 10*3/uL — ABNORMAL HIGH (ref 4.0–10.5)
nRBC: 0.1 % (ref 0.0–0.2)

## 2019-12-02 LAB — BASIC METABOLIC PANEL
Anion gap: 4 — ABNORMAL LOW (ref 5–15)
BUN: 21 mg/dL (ref 8–23)
CO2: 24 mmol/L (ref 22–32)
Calcium: 7.3 mg/dL — ABNORMAL LOW (ref 8.9–10.3)
Chloride: 111 mmol/L (ref 98–111)
Creatinine, Ser: 0.71 mg/dL (ref 0.44–1.00)
GFR, Estimated: >60 mL/min (ref >60)
Glucose, Bld: 98 mg/dL (ref 70–99)
Potassium: 3.7 mmol/L (ref 3.5–5.1)
Sodium: 139 mmol/L (ref 135–145)

## 2019-12-02 LAB — SARS CORONAVIRUS 2 BY RT PCR (HOSPITAL ORDER, PERFORMED IN ~~LOC~~ HOSPITAL LAB): SARS Coronavirus 2: NEGATIVE

## 2019-12-02 NOTE — Progress Notes (Signed)
Pt able to turn on her sides by herself. She did good on repositioning. Will monitor

## 2019-12-02 NOTE — TOC CAGE-AID Note (Signed)
Transition of Care [facility]) - CAGE-AID Screening   Patient Details  Name: Mackenzie Peterson MRN: [id number] Date of Birth: 1924-09-23  [facility]) CM/SW Contact:    Jimmy Picket, LCSWA Phone Number: 12/02/2019, 3:14 PM   Clinical Narrative:  Pt unable to participate in assessment due to memory impairment.  CAGE-AID Screening: Substance Abuse Screening unable to be completed due to: : Patient unable to participate               Isabella Stalling Clinical Social Worker [phone]

## 2019-12-02 NOTE — TOC Progression Note (Addendum)
Transition of Care [facility]) - Progression Note    Patient Details  Name: Mackenzie Peterson MRN: [id number] Date of Birth: 10-Jun-1924  [facility]) CM/SW Contact  Eduard Roux, [place] Phone Number: 12/02/2019, 3:17 PM  Clinical Narrative:     CSW called and provided patient's son,Luis with bed offers. He also requested bed search in the [facility] area. CSW sent referral. CSW verbally gave names of some facilities in the [facility] area. Family will review and call CSW back.  CSW will continue to follow and assist with discharge planning.  Antony Blackbird, MSW, LCSWA Clinical Social Worker   Expected Discharge Plan: Skilled Nursing Facility Barriers to Discharge: Continued Medical Work up  Expected Discharge Plan and Services Expected Discharge Plan: Skilled Nursing Facility In-house Referral: Clinical Social Work   Post Acute Care Choice: Skilled Nursing Facility Living arrangements for the past 2 months: Single Family Home                                       Social Determinants of Health (SDOH) Interventions    Readmission Risk Interventions No flowsheet data found.

## 2019-12-02 NOTE — TOC Progression Note (Signed)
Transition of Care [facility]) - Progression Note    Patient Details  Name: Mackenzie Peterson MRN: [id number] Date of Birth: 1924-04-10  Transition of Care [facility]) CM/SW Contact  Carmina Miller, LCSWA Phone Number: 12/02/2019, 8:41 AM  Clinical Narrative:    CSW spoke with son of pt, provided bed offers, pt son stated he will be in the hospital and would look over the offers then. CSW will provide medicare.gov ratings list.    Expected Discharge Plan: Skilled Nursing Facility Barriers to Discharge: Continued Medical Work up  Expected Discharge Plan and Services Expected Discharge Plan: Skilled Nursing Facility In-house Referral: Clinical Social Work   Post Acute Care Choice: Skilled Nursing Facility Living arrangements for the past 2 months: Single Family Home                                       Social Determinants of Health (SDOH) Interventions    Readmission Risk Interventions No flowsheet data found.

## 2019-12-02 NOTE — Progress Notes (Signed)
Using bedside video interpretor, patient expressing that she does not want to eat. She is not comfortable and is having a lot of pain in her right hip. With help of the NT, patient repositioned. Pain medication administered. Patient did drink some apple juice but continues to state she is not hungry. Will continue to encourage PO intake.

## 2019-12-02 NOTE — Progress Notes (Addendum)
PROGRESS NOTE    Mackenzie Peterson   JYN:[id number]  DOB: 1924-06-11  DOA: 11/28/2019 PCP: [name], MD   Brief Narrative:  Mackenzie Peterson  is a 86 y.o. mostly spanish speaking female with medical history significant of alzheimer's dementia, HTN, Hypothyroidism, PVD seen in ed for Fall and rt hip fracture. She had a hospital stay from 10/27-10/28 for confusion thought to be related to polypharmacy. She returned to baseline the following day and was discharged.  She fell at home (unwitnessed) and sustained a right hip fracture and a hematoma to the right eye.    Subjective: She did not have an appetite but her son was able to feed her.   Assessment & Plan:   Principal Problem:   Intertrochanteric fracture of right femur - s/p IM nail - on Lovenox for DVT prophylaxis - PT recommending SNF  Active Problems:  Diarrhea - severe, at least 5-6 episodes of watery stool since the night- she is sitting in watery stool at this time - mild tenderness in RLQ, no distension and normal bowel sounds - xray abdomen unrevealing, WBC 12.4 but this was 19.7 when first admitted- no fevers - start IVF, stopped Colace and Ensure  - diarrhea resolved as of yesterday afternoon - son was able to feed her and she is eating/and drinking again.- stopped IVF today  Hypokalemia - due to diarrhea and poor oral intake - start maintenance fluids with K - add 40 meq IV K -check Mg> 1.9 - K 3.7 today  Dementia with sundowning (per son) - son states her current level of confusion (thinking she is in [place] and not knowing the month or year) is baseline - on Aricept and Namenda - follow for sundowning- none noted so far daughter in law give her meds _ cont Seroquel BID, Trazodone and Zoloft QHS and follow   - she receives Xanax daily in the AM and 1-2 in evenings- will continue with 1 tab BID in hospital    Anemia and thrombocytopenia - Baseline Hb seems to range from 10-15 - Hb 13.7 on admission >  8.8 by 10/30> 7.1- transfused 1 U PRBC 10/31- - possibly due to acute blood loss from fracture - Hb now 9.5  Change in mental status on 10/27 - suspected to be polypharmacy - I've spoken with the son who described episode of staring and repeating word and an episode of "stiffing of her body and gurgling" prior to the MRI and followed by "no speaking" for almost an entire day and by 11 PM the following day she suddenly was back to her baseline - MRI was negative- - EEG noted below did not show epileptiform changes - Neuro eval requested- started on Keppra 250 BID for probable seizures    Hypothyroidism - Cont Synthroid    Mild persistent chronic asthma without complication - albuterol PRN    GERD - on PPI  Hyperlipidemia - Pravachol       Time spent in minutes: 35 DVT prophylaxis: enoxaparin (LOVENOX) injection 30 mg Start: 11/29/19 0800 SCDs Start: 11/28/19 2254 SCDs Start: 11/28/19 1632  Code Status: Full code Family Communication: son, Mackenzie Peterson Disposition Plan:  Status is: Inpatient  Remains inpatient appropriate because:  Treat dehydration, hypokalemia   Dispo: The patient is from: Home              Anticipated d/c is to: SNF              Anticipated d/c date is: tomorrow  Patient currently is medically stable to d/c. waiting on facility - COVID negative      Consultants:   Ortho Procedures:  Treatment of intertrochanteric, pertrochanteric, subtrochanteric fracture with intramedullary implant EEG:  This study is suggestive of mild diffuse encephalopathy, nonspecific etiology. No seizures or epileptiform discharges were seen throughout the recording.   Antimicrobials:  Anti-infectives (From admission, onward)   Start     Dose/Rate Route Frequency Ordered Stop   11/29/19 0600  ceFAZolin (ANCEF) IVPB 2g/100 mL premix        2 g 200 mL/hr over 30 Minutes Intravenous On call to O.R. 11/28/19 1758 11/28/19 1900   11/29/19 0000  ceFAZolin  (ANCEF) IVPB 2g/100 mL premix        2 g 200 mL/hr over 30 Minutes Intravenous Every 6 hours 11/28/19 2253 11/29/19 0722   11/28/19 1759  ceFAZolin (ANCEF) 2-4 GM/100ML-% IVPB       Note to Pharmacy: Aquilla HackerWalton, Susan   : cabinet override      11/28/19 1759 11/28/19 1911       Objective: Vitals:   12/01/19 1400 12/01/19 2009 12/02/19 0301 12/02/19 1108  BP: (!) 111/43 (!) 109/42 (!) 119/47   Pulse: 89  86   Resp: 17 17 17    Temp:  97.8 F (36.6 C) 98 F (36.7 C) 98.4 F (36.9 C)  TempSrc:  Oral Oral Oral  SpO2: 98% 95% 96% 95%  Weight:   48.8 kg   Height:        Intake/Output Summary (Last 24 hours) at 12/02/2019 1703 Last data filed at 12/02/2019 1555 Gross per 24 hour  Intake 2411.57 ml  Output 700 ml  Net 1711.57 ml   Filed Weights   11/30/19 0400 12/01/19 0053 12/02/19 0301  Weight: 48.1 kg 48.8 kg 48.8 kg    Examination: General exam: Appears comfortable  HEENT: PERRLA, oral mucosa moist, no sclera icterus or thrush Respiratory system: Clear to auscultation. Respiratory effort normal. Cardiovascular system: S1 & S2 heard,  No murmurs  Gastrointestinal system: Abdomen soft, non-tender, nondistended. Normal bowel sounds   Central nervous system: Alert and oriented. No focal neurological deficits. Extremities: No cyanosis, clubbing or edema Skin: No rashes or ulcers Psychiatry:  Mood & affect appropriate.    Data Reviewed: I have personally reviewed following labs and imaging studies  CBC: Recent Labs  Lab 11/26/19 1544 11/26/19 1829 11/28/19 1543 11/28/19 1543 11/29/19 1149 11/30/19 0906 11/30/19 2128 12/01/19 0457 12/02/19 0355  WBC 10.5   < > 19.7*  --  11.5* 10.2  --  12.4* 14.8*  NEUTROABS 8.1*  --  17.4*  --   --   --   --   --   --   HGB 15.7*   < > 13.7   < > 8.8* 7.1* 10.6* 11.2* 9.5*  HCT 47.9*   < > 42.0   < > 26.6* 21.6* 31.5* 32.5* 27.8*  MCV 92.5   < > 94.2  --  95.3 93.9  --  92.1 92.7  PLT 225   < > 243  --  148* 127*  --  147* 158   <  > = values in this interval not displayed.   Basic Metabolic Panel: Recent Labs  Lab 11/28/19 1543 11/30/19 0906 12/01/19 0457 12/01/19 2113 12/02/19 0355  NA 142 138 141 140 139  K 3.7 3.8 2.6* 4.0 3.7  CL 109 105 108 109 111  CO2 19* 28 25 25 24   GLUCOSE 118* 124* 121*  108* 98  BUN 20 19 23 23 21   CREATININE 0.96 0.88 0.85 0.74 0.71  CALCIUM 8.5* 7.6* 7.9* 7.4* 7.3*  MG  --   --  1.9  --   --    GFR: Estimated Creatinine Clearance: 31.6 mL/min (by C-G formula based on SCr of 0.71 mg/dL). Liver Function Tests: Recent Labs  Lab 11/26/19 1544  AST 32  ALT 24  ALKPHOS 78  BILITOT 1.0  PROT 6.6  ALBUMIN 3.5   No results for input(s): LIPASE, AMYLASE in the last 168 hours. Recent Labs  Lab 11/27/19 1055  AMMONIA 44*   Coagulation Profile: Recent Labs  Lab 11/28/19 1543  INR 1.2   Cardiac Enzymes: No results for input(s): CKTOTAL, CKMB, CKMBINDEX, TROPONINI in the last 168 hours. BNP (last 3 results) No results for input(s): PROBNP in the last 8760 hours. HbA1C: No results for input(s): HGBA1C in the last 72 hours. CBG: Recent Labs  Lab 11/26/19 1829  GLUCAP 94   Lipid Profile: No results for input(s): CHOL, HDL, LDLCALC, TRIG, CHOLHDL, LDLDIRECT in the last 72 hours. Thyroid Function Tests: No results for input(s): TSH, T4TOTAL, FREET4, T3FREE, THYROIDAB in the last 72 hours. Anemia Panel: No results for input(s): VITAMINB12, FOLATE, FERRITIN, TIBC, IRON, RETICCTPCT in the last 72 hours. Urine analysis:    Component Value Date/Time   COLORURINE YELLOW 11/26/2019 1322   APPEARANCEUR CLEAR 11/26/2019 1322   LABSPEC >1.030 (H) 11/26/2019 1322   PHURINE 5.5 11/26/2019 1322   GLUCOSEU NEGATIVE 11/26/2019 1322   HGBUR SMALL (A) 11/26/2019 1322   BILIRUBINUR NEGATIVE 11/26/2019 1322   KETONESUR NEGATIVE 11/26/2019 1322   PROTEINUR NEGATIVE 11/26/2019 1322   UROBILINOGEN 0.2 06/17/2012 1645   NITRITE NEGATIVE 11/26/2019 1322   LEUKOCYTESUR TRACE (A)  11/26/2019 1322   Sepsis Labs: @LABRCNTIP (procalcitonin:4,lacticidven:4) ) Recent Results (from the past 240 hour(s))  Urine culture     Status: None   Collection Time: 11/26/19 12:58 PM   Specimen: Urine, Catheterized  Result Value Ref Range Status   Specimen Description URINE, CATHETERIZED  Final   Special Requests NONE  Final   Culture   Final    NO GROWTH Performed at [facility], [address] [place], [address] [place]    Report Status 11/27/2019 FINAL  Final  Respiratory Panel by RT PCR (Flu A&B, Covid) - Nasopharyngeal Swab     Status: None   Collection Time: 11/26/19  5:06 PM   Specimen: Nasopharyngeal Swab  Result Value Ref Range Status   SARS Coronavirus 2 by RT PCR NEGATIVE NEGATIVE Final    Comment: (NOTE) SARS-CoV-2 target nucleic acids are NOT DETECTED.  The SARS-CoV-2 RNA is generally detectable in upper respiratoy specimens during the acute phase of infection. The lowest concentration of SARS-CoV-2 viral copies this assay can detect is 131 copies/mL. A negative result does not preclude SARS-Cov-2 infection and should not be used as the sole basis for treatment or other patient management decisions. A negative result may occur with  improper specimen collection/handling, submission of specimen other than nasopharyngeal swab, presence of viral mutation(s) within the areas targeted by this assay, and inadequate number of viral copies (<131 copies/mL). A negative result must be combined with clinical observations, patient history, and epidemiological information. The expected result is Negative.  Fact Sheet for Patients:  11/29/2019  Fact Sheet for Healthcare Providers:  11/28/19  This test is no t yet approved or cleared by the https://www.moore.com/ and  has been authorized for  detection and/or diagnosis of SARS-CoV-2 by FDA under an Emergency Use Authorization (EUA). This EUA will remain    in effect (meaning this test can be used) for the duration of the COVID-19 declaration under Section 564(b)(1) of the Act, 21 U.S.C. section 360bbb-3(b)(1), unless the authorization is terminated or revoked sooner.     Influenza A by PCR NEGATIVE NEGATIVE Final   Influenza B by PCR NEGATIVE NEGATIVE Final    Comment: (NOTE) The Xpert Xpress SARS-CoV-2/FLU/RSV assay is intended as an aid in  the diagnosis of influenza from Nasopharyngeal swab specimens and  should not be used as a sole basis for treatment. Nasal washings and  aspirates are unacceptable for Xpert Xpress SARS-CoV-2/FLU/RSV  testing.  Fact Sheet for Patients: https://www.moore.com/  Fact Sheet for Healthcare Providers: https://www.young.biz/  This test is not yet approved or cleared by the [place] FDA and  has been authorized for detection and/or diagnosis of SARS-CoV-2 by  FDA under an Emergency Use Authorization (EUA). This EUA will remain  in effect (meaning this test can be used) for the duration of the  Covid-19 declaration under Section 564(b)(1) of the Act, 21  U.S.C. section 360bbb-3(b)(1), unless the authorization is  terminated or revoked. Performed at [facility], [address], [place], [place] [address]   Respiratory Panel by RT PCR (Flu A&B, Covid) - Nasopharyngeal Swab     Status: None   Collection Time: 11/28/19  4:04 PM   Specimen: Nasopharyngeal Swab  Result Value Ref Range Status   SARS Coronavirus 2 by RT PCR NEGATIVE NEGATIVE Final    Comment: (NOTE) SARS-CoV-2 target nucleic acids are NOT DETECTED.  The SARS-CoV-2 RNA is generally detectable in upper respiratoy specimens during the acute phase of infection. The lowest concentration of SARS-CoV-2 viral copies this assay can detect is 131 copies/mL. A negative result does not preclude SARS-Cov-2 infection and should not be used as the sole basis for treatment or other patient management  decisions. A negative result may occur with  improper specimen collection/handling, submission of specimen other than nasopharyngeal swab, presence of viral mutation(s) within the areas targeted by this assay, and inadequate number of viral copies (<131 copies/mL). A negative result must be combined with clinical observations, patient history, and epidemiological information. The expected result is Negative.  Fact Sheet for Patients:  https://www.moore.com/  Fact Sheet for Healthcare Providers:  https://www.young.biz/  This test is no t yet approved or cleared by the [place] FDA and  has been authorized for detection and/or diagnosis of SARS-CoV-2 by FDA under an Emergency Use Authorization (EUA). This EUA will remain  in effect (meaning this test can be used) for the duration of the COVID-19 declaration under Section 564(b)(1) of the Act, 21 U.S.C. section 360bbb-3(b)(1), unless the authorization is terminated or revoked sooner.     Influenza A by PCR NEGATIVE NEGATIVE Final   Influenza B by PCR NEGATIVE NEGATIVE Final    Comment: (NOTE) The Xpert Xpress SARS-CoV-2/FLU/RSV assay is intended as an aid in  the diagnosis of influenza from Nasopharyngeal swab specimens and  should not be used as a sole basis for treatment. Nasal washings and  aspirates are unacceptable for Xpert Xpress SARS-CoV-2/FLU/RSV  testing.  Fact Sheet for Patients: https://www.moore.com/  Fact Sheet for Healthcare Providers: https://www.young.biz/  This test is not yet approved or cleared by the [place] FDA and  has been authorized for detection and/or diagnosis of SARS-CoV-2 by  FDA under an Emergency Use Authorization (EUA). This  EUA will remain  in effect (meaning this test can be used) for the duration of the  Covid-19 declaration under Section 564(b)(1) of the Act, 21  U.S.C. section 360bbb-3(b)(1), unless the  authorization is  terminated or revoked. Performed at [facility], [address], [place], [place] [address]   SARS Coronavirus 2 by RT PCR (hospital order, performed in [facility] hospital lab) Nasopharyngeal Nasopharyngeal Swab     Status: None   Collection Time: 12/02/19  2:30 PM   Specimen: Nasopharyngeal Swab  Result Value Ref Range Status   SARS Coronavirus 2 NEGATIVE NEGATIVE Final    Comment: (NOTE) SARS-CoV-2 target nucleic acids are NOT DETECTED.  The SARS-CoV-2 RNA is generally detectable in upper and lower respiratory specimens during the acute phase of infection. The lowest concentration of SARS-CoV-2 viral copies this assay can detect is 250 copies / mL. A negative result does not preclude SARS-CoV-2 infection and should not be used as the sole basis for treatment or other patient management decisions.  A negative result may occur with improper specimen collection / handling, submission of specimen other than nasopharyngeal swab, presence of viral mutation(s) within the areas targeted by this assay, and inadequate number of viral copies (<250 copies / mL). A negative result must be combined with clinical observations, patient history, and epidemiological information.  Fact Sheet for Patients:   BoilerBrush.com.cy  Fact Sheet for Healthcare Providers: https://pope.com/  This test is not yet approved or  cleared by the [place] FDA and has been authorized for detection and/or diagnosis of SARS-CoV-2 by FDA under an Emergency Use Authorization (EUA).  This EUA will remain in effect (meaning this test can be used) for the duration of the COVID-19 declaration under Section 564(b)(1) of the Act, 21 U.S.C. section 360bbb-3(b)(1), unless the authorization is terminated or revoked sooner.  Performed at [facility], [address] [place], [place] [address]          Radiology Studies: DG Abd  Portable 2V  Result Date: 12/01/2019 CLINICAL DATA:  Diarrhea. EXAM: PORTABLE ABDOMEN - 2 VIEW COMPARISON:  None. FINDINGS: The bowel gas pattern is normal. There is no evidence of free air. No radio-opaque calculi or other significant radiographic abnormality is seen. IMPRESSION: Negative. Electronically Signed   By: Lupita Raider M.D.   On: 12/01/2019 10:14      Scheduled Meds: . ALPRAZolam  0.5 mg Oral Daily  . darifenacin  7.5 mg Oral Daily  . donepezil  10 mg Oral QHS  . enoxaparin (LOVENOX) injection  30 mg Subcutaneous Q24H  . levETIRAcetam  250 mg Oral BID  . levothyroxine  25 mcg Oral QAC breakfast  . loratadine  10 mg Oral Daily  . mouth rinse  15 mL Mouth Rinse BID  . memantine  10 mg Oral BID  . multivitamin with minerals  1 tablet Oral Q breakfast  . pantoprazole  40 mg Oral Daily  . pravastatin  20 mg Oral QHS  . QUEtiapine  50 mg Oral BID  . sertraline  25 mg Oral QHS  . traZODone  50 mg Oral QHS   Continuous Infusions:    LOS: 4 days      Calvert Cantor, MD [facility] Pager: [email or web address] 12/02/2019, 5:03 PM

## 2019-12-02 NOTE — Progress Notes (Signed)
Physical Therapy Treatment Patient Details Name: Mackenzie Peterson MRN: [id number] DOB: [birth year] Today's Date: 12/02/2019    History of Present Illness Brenda Samano is a 86 y.o. female with medical history significant of alzheimer's, htn. Hyperlipidemia,hypothyroid, PVD seen in ed for Fall and rt hip fracture.  Patient discharged yesterday for delirium admission to rehab facility.  Since which patient was 5 after an unwitnessed fall and patient was not able to get up.  Imaging showed that patient has sustained a right intertrochanteric fracture. Patient s/p R hip IM nail on 10/29.    PT Comments    Patient progressing towards physical therapy goals, however limited this session due to orthostatics, see below. Patient required modAx2 for supine>sit and minAx2 for sit to stand and stand pivot transfer, assist required for RW management during transfers. Patient continues to be limited by pain, decreased activity tolerance, generalized weakness, impaired balance, and impaired functional mobility. Continue to recommend SNF for ongoing Physical Therapy.     Orthostatic BPs  Supine 138/54 HR 93  Sitting 91/77 HR 95  Sitting after 3 min 134/49 HR 96  Standing 110/53 HR 95  Standing after 3 min 125/63 HR 93  After transfer to chair 127/51 HR 89      Follow Up Recommendations  SNF;Supervision/Assistance - 24 hour     Equipment Recommendations   (defer to post acute rehab)    Recommendations for Other Services       Precautions / Restrictions Precautions Precautions: Fall Restrictions Weight Bearing Restrictions: Yes RLE Weight Bearing: Weight bearing as tolerated    Mobility  Bed Mobility Overal bed mobility: Needs Assistance Bed Mobility: Supine to Sit     Supine to sit: Mod assist;+2 for physical assistance        Transfers Overall transfer level: Needs assistance Equipment used: Rolling Eyoel Throgmorton (2 wheeled) Transfers: Sit to/from [employer] Sit to Stand:  Min assist;+2 physical assistance Stand pivot transfers: Min assist       General transfer comment: Patient required minAx2 for power up and minA for stand pivot transfer for RW management   Ambulation/Gait                 Stairs             Wheelchair Mobility    Modified Rankin (Stroke Patients Only)       Balance Overall balance assessment: Needs assistance Sitting-balance support: No upper extremity supported;Feet supported Sitting balance-Leahy Scale: Fair     Standing balance support: Bilateral upper extremity supported;During functional activity Standing balance-Leahy Scale: Poor Standing balance comment: Reliant on BUE support                             Cognition Arousal/Alertness: Awake/alert Behavior During Therapy: WFL for tasks assessed/performed Overall Cognitive Status: History of cognitive impairments - at baseline                                 General Comments: history of dementia at baseline      Exercises      General Comments General comments (skin integrity, edema, etc.): Son present during session, willing to translate as patient has difficulty hearing and video interpreter is not loud enough at times      Pertinent Vitals/Pain Pain Assessment: Faces Faces Pain Scale: Hurts even more Pain Location: right hip Pain Descriptors / Indicators: Aching;Grimacing;Guarding;Sore Pain  Intervention(s): Limited activity within patient's tolerance;Monitored during session;Repositioned    Home Living                      Prior Function            PT Goals (current goals can now be found in the care plan section) Acute Rehab PT Goals Patient Stated Goal: unable to state PT Goal Formulation: With patient/family Time For Goal Achievement: 12/13/19 Potential to Achieve Goals: Good Progress towards PT goals: Progressing toward goals    Frequency    Min 3X/week      PT Plan Current plan  remains appropriate    Co-evaluation              AM-PAC PT "6 Clicks" Mobility   Outcome Measure  Help needed turning from your back to your side while in a flat bed without using bedrails?: A Lot Help needed moving from lying on your back to sitting on the side of a flat bed without using bedrails?: A Lot Help needed moving to and from a bed to a chair (including a wheelchair)?: A Little Help needed standing up from a chair using your arms (e.g., wheelchair or bedside chair)?: A Little Help needed to walk in hospital room?: A Lot Help needed climbing 3-5 steps with a railing? : Total 6 Click Score: 13    End of Session Equipment Utilized During Treatment: Gait belt Activity Tolerance: Other (comment) (Patient limited by orthostatics and complaints of dizziness) Patient left: in chair;with call bell/phone within reach;with chair alarm set;with family/visitor present Nurse Communication: Mobility status PT Visit Diagnosis: Unsteadiness on feet (R26.81);Muscle weakness (generalized) (M62.81)     Time: 9449-6759 PT Time Calculation (min) (ACUTE ONLY): 23 min  Charges:  $Therapeutic Activity: 23-37 mins                     Gregor Hams, PT, DPT Acute Rehabilitation Services Pager [phone] Office [phone]    Zannie Kehr Allred 12/02/2019, 1:04 PM

## 2019-12-03 DIAGNOSIS — D72829 Elevated white blood cell count, unspecified: Secondary | ICD-10-CM | POA: Diagnosis not present

## 2019-12-03 DIAGNOSIS — S72141A Displaced intertrochanteric fracture of right femur, initial encounter for closed fracture: Secondary | ICD-10-CM | POA: Diagnosis not present

## 2019-12-03 LAB — URINALYSIS, ROUTINE W REFLEX MICROSCOPIC
Bilirubin Urine: NEGATIVE
Glucose, UA: NEGATIVE mg/dL
Ketones, ur: NEGATIVE mg/dL
Nitrite: NEGATIVE
Protein, ur: NEGATIVE mg/dL
Specific Gravity, Urine: 1.012 (ref 1.005–1.030)
pH: 5 (ref 5.0–8.0)

## 2019-12-03 NOTE — Care Management Important Message (Signed)
Important Message  Patient Details  Name: Mackenzie Peterson MRN: [id number] Date of Birth: [birth year]   Medicare Important Message Given:  Yes     Renie Ora 12/03/2019, 8:59 AM

## 2019-12-03 NOTE — Progress Notes (Signed)
Physical Therapy Treatment Patient Details Name: Mackenzie Peterson MRN: [id number] DOB: 05-04-1924 Today's Date: 12/03/2019    History of Present Illness Mackenzie Peterson is a 86 y.o. female with medical history significant of alzheimer's, htn. Hyperlipidemia,hypothyroid, PVD seen in ed for Fall and rt hip fracture.  Patient discharged yesterday for delirium admission to rehab facility.  Since which patient was 5 after an unwitnessed fall and patient was not able to get up.  Imaging showed that patient has sustained a right intertrochanteric fracture. Patient s/p R hip IM nail on 10/29.    PT Comments    Patient limited this session due to lethargy, son states she has been like this all day. Notified RN. Performed functional exercises at bed level to address B LE strengthening and ROM. Deferred OOB mobility due to lethargy. Continue to recommend SNF for ongoing Physical Therapy.       Follow Up Recommendations  SNF;Supervision/Assistance - 24 hour     Equipment Recommendations   (defer to post acute rehab)    Recommendations for Other Services       Precautions / Restrictions Precautions Precautions: Fall Restrictions Weight Bearing Restrictions: Yes RLE Weight Bearing: Weight bearing as tolerated    Mobility  Bed Mobility Overal bed mobility:  (defer due to lethargy)                Transfers                    Ambulation/Gait                 Stairs             Wheelchair Mobility    Modified Rankin (Stroke Patients Only)       Balance                                            Cognition Arousal/Alertness: Lethargic Behavior During Therapy: WFL for tasks assessed/performed Overall Cognitive Status: History of cognitive impairments - at baseline                                 General Comments: history of dementia at baseline      Exercises General Exercises - Lower Extremity Ankle Circles/Pumps:  AAROM;PROM;Both;Supine Quad Sets: AROM;Right;5 reps Heel Slides: AROM;Both;5 reps    General Comments        Pertinent Vitals/Pain Pain Assessment: Faces Faces Pain Scale: Hurts even more Pain Location: right hip Pain Descriptors / Indicators: Grimacing;Guarding;Sore Pain Intervention(s): Limited activity within patient's tolerance;Monitored during session    Home Living                      Prior Function            PT Goals (current goals can now be found in the care plan section) Acute Rehab PT Goals Patient Stated Goal: unable to state PT Goal Formulation: With patient/family Time For Goal Achievement: 12/13/19 Potential to Achieve Goals: Good Progress towards PT goals: Progressing toward goals    Frequency    Min 3X/week      PT Plan Current plan remains appropriate    Co-evaluation              AM-PAC PT "6 Clicks" Mobility   Outcome Measure  Help  needed turning from your back to your side while in a flat bed without using bedrails?: A Lot Help needed moving from lying on your back to sitting on the side of a flat bed without using bedrails?: A Lot Help needed moving to and from a bed to a chair (including a wheelchair)?: A Little Help needed standing up from a chair using your arms (e.g., wheelchair or bedside chair)?: A Little Help needed to walk in hospital room?: A Lot Help needed climbing 3-5 steps with a railing? : Total 6 Click Score: 13    End of Session   Activity Tolerance: Patient limited by lethargy Patient left: in bed;with call bell/phone within reach;with bed alarm set;with family/visitor present Nurse Communication: Mobility status PT Visit Diagnosis: Unsteadiness on feet (R26.81);Muscle weakness (generalized) (M62.81)     Time: 0938-1829 PT Time Calculation (min) (ACUTE ONLY): 11 min  Charges:  $Therapeutic Exercise: 8-22 mins                     Gregor Hams, PT, DPT Acute Rehabilitation Services Pager  [phone] Office [phone]    Zannie Kehr Allred 12/03/2019, 1:59 PM

## 2019-12-03 NOTE — TOC Progression Note (Signed)
Transition of Care [facility]) - Progression Note    Patient Details  Name: Catalaya Garr MRN: [id number] Date of Birth: 10-Sep-1924  Transition of Care [facility]) CM/SW Contact  Carmina Miller, LCSWA Phone Number: 12/03/2019, 5:12 PM  Clinical Narrative:    CSW spoke with [facility], they can take pt when medically stable to dc.    Expected Discharge Plan: Skilled Nursing Facility Barriers to Discharge: Continued Medical Work up  Expected Discharge Plan and Services Expected Discharge Plan: Skilled Nursing Facility In-house Referral: Clinical Social Work   Post Acute Care Choice: Skilled Nursing Facility Living arrangements for the past 2 months: Single Family Home                                       Social Determinants of Health (SDOH) Interventions    Readmission Risk Interventions No flowsheet data found.

## 2019-12-03 NOTE — TOC Progression Note (Signed)
Transition of Care [facility]) - Progression Note    Patient Details  Name: Loucinda Croy MRN: [id number] Date of Birth: 05-23-1924  Transition of Care [facility]) CM/SW Contact  Eduard Roux, [place] Phone Number: 12/03/2019, 10:40 AM  Clinical Narrative:     CSW spoke with patient's son- he has selected [facility]-CSW spoke with [employer] - they are unable to make offer. CSW informed the patient's son. CSW advised another SNF choice is needed.  CSW will continue to follow and assist with discharge planning.  Antony Blackbird, MSW, LCSWA Clinical Social Worker    Antony Blackbird, MSW, LCSWA Clinical Social Worker   Expected Discharge Plan: Skilled Nursing Facility Barriers to Discharge: Continued Medical Work up  Expected Discharge Plan and Services Expected Discharge Plan: Skilled Nursing Facility In-house Referral: Clinical Social Work   Post Acute Care Choice: Skilled Nursing Facility Living arrangements for the past 2 months: Single Family Home                                       Social Determinants of Health (SDOH) Interventions    Readmission Risk Interventions No flowsheet data found.

## 2019-12-03 NOTE — Progress Notes (Signed)
[facility] PROGRESS NOTE    Progress Note  Mackenzie Peterson  VOZ:[id number] DOB: 09/08/1924 DOA: 11/28/2019 PCP: [name], MD     Brief Narrative:   Mackenzie Peterson is an 86 y.o. female mostly Spanish-speaking with a history of Alzheimer's hypothyroidism comes into the hospital for a fall and a right hip fracture at home.  Assessment/Plan:   Intertrochanteric fracture of right femur [facility]): Status post intramedullary nailing. Orthopedic surgery recommended DVT for prophylaxis. Physical therapy evaluated the patient recommended skilled nursing facility. Drop in hemoglobin from 11.2-9.2 we will recheck a CBC tomorrow we will need to be monitoring her stools for melena or bloody stools. With a drop in hemoglobin from 11-9 and a rising white count, I am concerned for possible bleed versus infection due to her age. She has remained afebrile we will check a CBC tomorrow morning. SARS-CoV-2 2 PCR is negative.  New diarrhea: Continues to have watery stool with mild right upper quadrant tenderness no distention. X-ray was unrevealing with a white count of 12 she has remained afebrile. White Cell count today is trending up.  She has remained afebrile repeat CBC tomorrow morning.  Hypokalemia: Likely due to diarrhea and decreased oral intake.  Alzheimer's dementia/sundowning: Continue current medication she receives Xanax daily in the morning and in the evening we will continue in-house. She had a mental status change likely due to polypharmacy. Normocytic anemia/thrombocytopenia: There is been a mild drop in hemoglobin from 11-9 she status post 1 unit of packed red blood cells on 11/30/2019. Recheck tomorrow morning.  Mental status change: Suspect polypharmacy MRI was negative neurology was consulted we did an EEG that did not show elliptic form changes. He was started on Keppra p.o. twice daily for possible seizures.  Hypothyroidism: Continue Synthroid.  Worsening  leukocytosis: She is complaining of dysuria with suprapubic pain on physical exam, will check UA she has remained afebrile.  Repeat a CBC with differential in the morning.    DVT prophylaxis: lovenox Family Communication:Son Status is: Inpatient  Remains inpatient appropriate because:Hemodynamically unstable   Dispo: The patient is from: Home              Anticipated d/c is to: SNF              Anticipated d/c date is: 1 day              Patient currently is not medically stable to d/c.        Code Status:     Code Status Orders  (From admission, onward)         Start     Ordered   11/28/19 1632  Full code  Continuous        11/28/19 1633        Code Status History    Date Active Date Inactive Code Status Order ID Comments User Context   11/26/2019 1811 11/27/2019 2132 Full Code [id number]  Gertha Calkin, MD ED   01/05/2012 2033 01/08/2012 1927 Full Code [id number]  Dorothea Ogle, MD Inpatient   Advance Care Planning Activity        IV Access:    Peripheral IV   Procedures and diagnostic studies:   No results found.   Medical Consultants:    None.  Anti-Infectives:   none  Subjective:    Mackenzie Peterson she relates some dysuria with urination.  Objective:    Vitals:   12/02/19 2022 12/03/19 0004 12/03/19 0441 12/03/19 0648  BP: 104/89 Marland Kitchen)  132/44 (!) 114/50 (!) 113/47  Pulse: 89     Resp: Temp: 99.4 F (37.4 C) 98.1 F (36.7 C) 98.3 F (36.8 C)   TempSrc: Oral Oral    SpO2: 96% 97% 96%   Weight:  49.2 kg    Height:       SpO2: 96 % O2 Flow Rate (L/min): 2 L/min   Intake/Output Summary (Last 24 hours) at 12/03/2019 1011 Last data filed at 12/03/2019 0451 Gross per 24 hour  Intake 1241.74 ml  Output 800 ml  Net 441.74 ml   Filed Weights   12/01/19 0053 12/02/19 0301 12/03/19 0004  Weight: 48.8 kg 48.8 kg 49.2 kg    Exam: General exam: In no acute distress. Respiratory system: Good air movement and clear to  auscultation. Cardiovascular system: S1 & S2 heard, RRR.  Gastrointestinal system: Abdomen is nondistended, soft and suprapubic tenderness. Extremities: No pedal edema. Skin: No rashes, lesions or ulcers Psychiatry: Judgement and insight appear normal. Mood & affect appropriate.    Data Reviewed:    Labs: Basic Metabolic Panel: Recent Labs  Lab 11/28/19 1543 11/28/19 1543 11/30/19 0906 11/30/19 0906 12/01/19 0457 12/01/19 0457 12/01/19 2113 12/02/19 0355  NA 142  --  138  --  141  --  140 139  K 3.7   < > 3.8   < > 2.6*   < > 4.0 3.7  CL 109  --  105  --  108  --  109 111  CO2 19*  --  28  --  25  --  25 24  GLUCOSE 118*  --  124*  --  121*  --  108* 98  BUN 20  --  19  --  23  --  23 21  CREATININE 0.96  --  0.88  --  0.85  --  0.74 0.71  CALCIUM 8.5*  --  7.6*  --  7.9*  --  7.4* 7.3*  MG  --   --   --   --  1.9  --   --   --    < > = values in this interval not displayed.   GFR Estimated Creatinine Clearance: 31.6 mL/min (by C-G formula based on SCr of 0.71 mg/dL). Liver Function Tests: Recent Labs  Lab 11/26/19 1544  AST 32  ALT 24  ALKPHOS 78  BILITOT 1.0  PROT 6.6  ALBUMIN 3.5   No results for input(s): LIPASE, AMYLASE in the last 168 hours. Recent Labs  Lab 11/27/19 1055  AMMONIA 44*   Coagulation profile Recent Labs  Lab 11/28/19 1543  INR 1.2   COVID-19 Labs  No results for input(s): DDIMER, FERRITIN, LDH, CRP in the last 72 hours.  Lab Results  Component Value Date   SARSCOV2NAA NEGATIVE 12/02/2019   SARSCOV2NAA NEGATIVE 11/28/2019   SARSCOV2NAA NEGATIVE 11/26/2019    CBC: Recent Labs  Lab 11/26/19 1544 11/26/19 1829 11/28/19 1543 11/28/19 1543 11/29/19 1149 11/30/19 0906 11/30/19 2128 12/01/19 0457 12/02/19 0355  WBC 10.5   < > 19.7*  --  11.5* 10.2  --  12.4* 14.8*  NEUTROABS 8.1*  --  17.4*  --   --   --   --   --   --   HGB 15.7*   < > 13.7   < > 8.8* 7.1* 10.6* 11.2* 9.5*  HCT 47.9*   < > 42.0   < > 26.6* 21.6* 31.5*  32.5* 27.8*  MCV  92.5   < > 94.2  --  95.3 93.9  --  92.1 92.7  PLT 225   < > 243  --  148* 127*  --  147* 158   < > = values in this interval not displayed.   Cardiac Enzymes: No results for input(s): CKTOTAL, CKMB, CKMBINDEX, TROPONINI in the last 168 hours. BNP (last 3 results) No results for input(s): PROBNP in the last 8760 hours. CBG: Recent Labs  Lab 11/26/19 1829  GLUCAP 94   D-Dimer: No results for input(s): DDIMER in the last 72 hours. Hgb A1c: No results for input(s): HGBA1C in the last 72 hours. Lipid Profile: No results for input(s): CHOL, HDL, LDLCALC, TRIG, CHOLHDL, LDLDIRECT in the last 72 hours. Thyroid function studies: No results for input(s): TSH, T4TOTAL, T3FREE, THYROIDAB in the last 72 hours.  Invalid input(s): FREET3 Anemia work up: No results for input(s): VITAMINB12, FOLATE, FERRITIN, TIBC, IRON, RETICCTPCT in the last 72 hours. Sepsis Labs: Recent Labs  Lab 11/29/19 1149 11/30/19 0906 12/01/19 0457 12/02/19 0355  WBC 11.5* 10.2 12.4* 14.8*   Microbiology Recent Results (from the past 240 hour(s))  Urine culture     Status: None   Collection Time: 11/26/19 12:58 PM   Specimen: Urine, Catheterized  Result Value Ref Range Status   Specimen Description URINE, CATHETERIZED  Final   Special Requests NONE  Final   Culture   Final    NO GROWTH Performed at [facility], [address] [place], [place] [address]    Report Status 11/27/2019 FINAL  Final  Respiratory Panel by RT PCR (Flu A&B, Covid) - Nasopharyngeal Swab     Status: None   Collection Time: 11/26/19  5:06 PM   Specimen: Nasopharyngeal Swab  Result Value Ref Range Status   SARS Coronavirus 2 by RT PCR NEGATIVE NEGATIVE Final    Comment: (NOTE) SARS-CoV-2 target nucleic acids are NOT DETECTED.  The SARS-CoV-2 RNA is generally detectable in upper respiratoy specimens during the acute phase of infection. The lowest concentration of SARS-CoV-2 viral copies this assay can  detect is 131 copies/mL. A negative result does not preclude SARS-Cov-2 infection and should not be used as the sole basis for treatment or other patient management decisions. A negative result may occur with  improper specimen collection/handling, submission of specimen other than nasopharyngeal swab, presence of viral mutation(s) within the areas targeted by this assay, and inadequate number of viral copies (<131 copies/mL). A negative result must be combined with clinical observations, patient history, and epidemiological information. The expected result is Negative.  Fact Sheet for Patients:  https://www.moore.com/https://www.fda.gov/media/142436/download  Fact Sheet for Healthcare Providers:  https://www.young.biz/https://www.fda.gov/media/142435/download  This test is no t yet approved or cleared by the [place] FDA and  has been authorized for detection and/or diagnosis of SARS-CoV-2 by FDA under an Emergency Use Authorization (EUA). This EUA will remain  in effect (meaning this test can be used) for the duration of the COVID-19 declaration under Section 564(b)(1) of the Act, 21 U.S.C. section 360bbb-3(b)(1), unless the authorization is terminated or revoked sooner.     Influenza A by PCR NEGATIVE NEGATIVE Final   Influenza B by PCR NEGATIVE NEGATIVE Final    Comment: (NOTE) The Xpert Xpress SARS-CoV-2/FLU/RSV assay is intended as an aid in  the diagnosis of influenza from Nasopharyngeal swab specimens and  should not be used as a sole basis for treatment. Nasal washings and  aspirates are unacceptable for Xpert Xpress SARS-CoV-2/FLU/RSV  testing.  Fact Sheet for Patients: https://www.moore.com/  Fact Sheet for Healthcare Providers: https://www.young.biz/  This test is not yet approved or cleared by the [place] FDA and  has been authorized for detection and/or diagnosis of SARS-CoV-2 by  FDA under an Emergency Use Authorization (EUA). This EUA will remain  in  effect (meaning this test can be used) for the duration of the  Covid-19 declaration under Section 564(b)(1) of the Act, 21  U.S.C. section 360bbb-3(b)(1), unless the authorization is  terminated or revoked. Performed at [facility], [address], [place], [place] [address]   Respiratory Panel by RT PCR (Flu A&B, Covid) - Nasopharyngeal Swab     Status: None   Collection Time: 11/28/19  4:04 PM   Specimen: Nasopharyngeal Swab  Result Value Ref Range Status   SARS Coronavirus 2 by RT PCR NEGATIVE NEGATIVE Final    Comment: (NOTE) SARS-CoV-2 target nucleic acids are NOT DETECTED.  The SARS-CoV-2 RNA is generally detectable in upper respiratoy specimens during the acute phase of infection. The lowest concentration of SARS-CoV-2 viral copies this assay can detect is 131 copies/mL. A negative result does not preclude SARS-Cov-2 infection and should not be used as the sole basis for treatment or other patient management decisions. A negative result may occur with  improper specimen collection/handling, submission of specimen other than nasopharyngeal swab, presence of viral mutation(s) within the areas targeted by this assay, and inadequate number of viral copies (<131 copies/mL). A negative result must be combined with clinical observations, patient history, and epidemiological information. The expected result is Negative.  Fact Sheet for Patients:  https://www.moore.com/  Fact Sheet for Healthcare Providers:  https://www.young.biz/  This test is no t yet approved or cleared by the [place] FDA and  has been authorized for detection and/or diagnosis of SARS-CoV-2 by FDA under an Emergency Use Authorization (EUA). This EUA will remain  in effect (meaning this test can be used) for the duration of the COVID-19 declaration under Section 564(b)(1) of the Act, 21 U.S.C. section 360bbb-3(b)(1), unless the authorization is terminated  or revoked sooner.     Influenza A by PCR NEGATIVE NEGATIVE Final   Influenza B by PCR NEGATIVE NEGATIVE Final    Comment: (NOTE) The Xpert Xpress SARS-CoV-2/FLU/RSV assay is intended as an aid in  the diagnosis of influenza from Nasopharyngeal swab specimens and  should not be used as a sole basis for treatment. Nasal washings and  aspirates are unacceptable for Xpert Xpress SARS-CoV-2/FLU/RSV  testing.  Fact Sheet for Patients: https://www.moore.com/  Fact Sheet for Healthcare Providers: https://www.young.biz/  This test is not yet approved or cleared by the [place] FDA and  has been authorized for detection and/or diagnosis of SARS-CoV-2 by  FDA under an Emergency Use Authorization (EUA). This EUA will remain  in effect (meaning this test can be used) for the duration of the  Covid-19 declaration under Section 564(b)(1) of the Act, 21  U.S.C. section 360bbb-3(b)(1), unless the authorization is  terminated or revoked. Performed at [facility], [address] [place], [place] [address]   SARS Coronavirus 2 by RT PCR (hospital order, performed in [facility] hospital lab) Nasopharyngeal Nasopharyngeal Swab     Status: None   Collection Time: 12/02/19  2:30 PM   Specimen: Nasopharyngeal Swab  Result Value Ref Range Status   SARS Coronavirus 2 NEGATIVE NEGATIVE Final    Comment: (NOTE) SARS-CoV-2 target nucleic acids are NOT DETECTED.  The SARS-CoV-2 RNA is generally detectable  in upper and lower respiratory specimens during the acute phase of infection. The lowest concentration of SARS-CoV-2 viral copies this assay can detect is 250 copies / mL. A negative result does not preclude SARS-CoV-2 infection and should not be used as the sole basis for treatment or other patient management decisions.  A negative result may occur with improper specimen collection / handling, submission of specimen other than nasopharyngeal swab,  presence of viral mutation(s) within the areas targeted by this assay, and inadequate number of viral copies (<250 copies / mL). A negative result must be combined with clinical observations, patient history, and epidemiological information.  Fact Sheet for Patients:   BoilerBrush.com.cy  Fact Sheet for Healthcare Providers: https://pope.com/  This test is not yet approved or  cleared by the [place] FDA and has been authorized for detection and/or diagnosis of SARS-CoV-2 by FDA under an Emergency Use Authorization (EUA).  This EUA will remain in effect (meaning this test can be used) for the duration of the COVID-19 declaration under Section 564(b)(1) of the Act, 21 U.S.C. section 360bbb-3(b)(1), unless the authorization is terminated or revoked sooner.  Performed at [facility], [address], [place], [place] [address]      Medications:   . ALPRAZolam  0.5 mg Oral Daily  . darifenacin  7.5 mg Oral Daily  . donepezil  10 mg Oral QHS  . enoxaparin (LOVENOX) injection  30 mg Subcutaneous Q24H  . levETIRAcetam  250 mg Oral BID  . levothyroxine  25 mcg Oral QAC breakfast  . loratadine  10 mg Oral Daily  . mouth rinse  15 mL Mouth Rinse BID  . memantine  10 mg Oral BID  . multivitamin with minerals  1 tablet Oral Q breakfast  . pantoprazole  40 mg Oral Daily  . pravastatin  20 mg Oral QHS  . QUEtiapine  50 mg Oral BID  . sertraline  25 mg Oral QHS  . traZODone  50 mg Oral QHS   Continuous Infusions:    LOS: 5 days   Marinda Elk  [facility]  12/03/2019, 10:11 AM

## 2019-12-04 DIAGNOSIS — E039 Hypothyroidism, unspecified: Secondary | ICD-10-CM | POA: Diagnosis not present

## 2019-12-04 DIAGNOSIS — S72144A Nondisplaced intertrochanteric fracture of right femur, initial encounter for closed fracture: Secondary | ICD-10-CM | POA: Diagnosis not present

## 2019-12-04 DIAGNOSIS — R197 Diarrhea, unspecified: Secondary | ICD-10-CM | POA: Diagnosis not present

## 2019-12-04 LAB — CBC WITH DIFFERENTIAL/PLATELET
Abs Immature Granulocytes: 0.18 10*3/uL — ABNORMAL HIGH (ref 0.00–0.07)
Basophils Absolute: 0 10*3/uL (ref 0.0–0.1)
Basophils Relative: 0 %
Eosinophils Absolute: 0.3 10*3/uL (ref 0.0–0.5)
Eosinophils Relative: 2 %
HCT: 27.8 % — ABNORMAL LOW (ref 36.0–46.0)
Hemoglobin: 9.6 g/dL — ABNORMAL LOW (ref 12.0–15.0)
Immature Granulocytes: 1 %
Lymphocytes Relative: 9 %
Lymphs Abs: 1.1 10*3/uL (ref 0.7–4.0)
MCH: 32.1 pg (ref 26.0–34.0)
MCHC: 34.5 g/dL (ref 30.0–36.0)
MCV: 93 fL (ref 80.0–100.0)
Monocytes Absolute: 1 10*3/uL (ref 0.1–1.0)
Monocytes Relative: 8 %
Neutro Abs: 10.2 10*3/uL — ABNORMAL HIGH (ref 1.7–7.7)
Neutrophils Relative %: 80 %
Platelets: 251 10*3/uL (ref 150–400)
RBC: 2.99 MIL/uL — ABNORMAL LOW (ref 3.87–5.11)
RDW: 14.8 % (ref 11.5–15.5)
WBC: 12.8 10*3/uL — ABNORMAL HIGH (ref 4.0–10.5)
nRBC: 0.3 % — ABNORMAL HIGH (ref 0.0–0.2)

## 2019-12-04 MED ORDER — ENOXAPARIN SODIUM 30 MG/0.3ML ~~LOC~~ SOLN: 30.0000 mg | SUBCUTANEOUS | Status: DC

## 2019-12-04 MED ORDER — ONDANSETRON 4 MG PO TBDP
8.0000 mg | ORAL_TABLET | Freq: Three times a day (TID) | ORAL | Status: DC | PRN
  Filled 2019-12-04: qty 2

## 2019-12-04 MED ORDER — CEPHALEXIN 500 MG PO CAPS: 500.0000 mg | ORAL_CAPSULE | Freq: Three times a day (TID) | ORAL | 0 refills | Status: AC

## 2019-12-04 MED ORDER — CEPHALEXIN 250 MG PO CAPS
500.0000 mg | ORAL_CAPSULE | Freq: Three times a day (TID) | ORAL | Status: DC
  Administered 2019-12-04 – 2019-12-05 (×3): 500 mg via ORAL
  Filled 2019-12-04 (×3): qty 2

## 2019-12-04 MED ORDER — ALPRAZOLAM 0.5 MG PO TABS: 0.5000 mg | ORAL_TABLET | Freq: Three times a day (TID) | ORAL | 0 refills | Status: AC | PRN

## 2019-12-04 MED ORDER — LEVETIRACETAM 250 MG PO TABS
250.0000 mg | ORAL_TABLET | Freq: Two times a day (BID) | ORAL | 3 refills | Status: DC
Start: 2019-12-04 — End: 2021-08-09

## 2019-12-04 MED ORDER — TRAMADOL HCL 50 MG PO TABS
50.0000 mg | ORAL_TABLET | Freq: Four times a day (QID) | ORAL | 0 refills | Status: DC | PRN
Start: 2019-12-04 — End: 2021-08-09

## 2019-12-04 NOTE — Progress Notes (Signed)
Physical Therapy Treatment Patient Details Name: Mackenzie Peterson MRN: [id number] DOB: 13-May-1924 Today's Date: 12/04/2019    History of Present Illness Mackenzie Peterson is a 86 y.o. female with medical history significant of alzheimer's, htn. Hyperlipidemia,hypothyroid, PVD seen in ed for Fall and rt hip fracture.  Patient discharged yesterday for delirium admission to rehab facility.  Since which patient was 5 after an unwitnessed fall and patient was not able to get up.  Imaging showed that patient has sustained a right intertrochanteric fracture. Patient s/p R hip IM nail on 10/29.    PT Comments    Patient more awake/alert this session. Patient required modA for bed mobility and modAx2 for stand pivot transfer this session. Patient requires constant cueing for sequencing due to impaired cognition. Patient has STM deficits which affects task performance, patient forgot task of transfer to chair, pt attempted to return to bed and began to sit with no chair behind her, modA to maintain standing. Patient continues to be limited by pain, decreased activity tolerance, generalized weakness, impaired balance, and impaired functional mobility. Continue to recommend SNF for ongoing Physical Therapy.       Follow Up Recommendations  SNF;Supervision/Assistance - 24 hour     Equipment Recommendations  Rolling Mackenzie Peterson with 5" wheels;3in1 (PT)    Recommendations for Other Services       Precautions / Restrictions Precautions Precautions: Fall Restrictions Weight Bearing Restrictions: Yes RLE Weight Bearing: Weight bearing as tolerated    Mobility  Bed Mobility Overal bed mobility: Needs Assistance Bed Mobility: Supine to Sit     Supine to sit: Mod assist        Transfers Overall transfer level: Needs assistance Equipment used: Rolling Adya Wirz (2 wheeled) Transfers: Sit to/from Mackenzie Peterson Sit to Stand: Min guard Stand pivot transfers: Mod assist;+2 safety/equipment;+2  physical assistance       General transfer comment: patient required modAx2 for stand pivot due to STM deficits with forgetting task and attempting to return to bed and began sitting with no chair behind. Patient would benefit from consistent cueing and reminders of task  Ambulation/Gait                 Stairs             Wheelchair Mobility    Modified Rankin (Stroke Patients Only)       Balance Overall balance assessment: Needs assistance Sitting-balance support: No upper extremity supported;Feet supported Sitting balance-Mackenzie Peterson: Fair     Standing balance support: Bilateral upper extremity supported;During functional activity Standing balance-Mackenzie Peterson: Poor Standing balance comment: Reliant on BUE support                             Cognition Arousal/Alertness: Awake/alert Behavior During Therapy: WFL for tasks assessed/performed Overall Cognitive Status: History of cognitive impairments - at baseline                                        Exercises      General Comments General comments (skin integrity, edema, etc.): Son present during session and willing to assist      Pertinent Vitals/Pain Pain Assessment: Faces Faces Pain Peterson: Hurts even more Pain Location: right hip Pain Descriptors / Indicators: Grimacing;Guarding;Sore Pain Intervention(s): Limited activity within patient's tolerance;Monitored during session;Repositioned    Home Living  Prior Function            PT Goals (current goals can now be found in the care plan section) Acute Rehab PT Goals Patient Stated Goal: unable to state PT Goal Formulation: With patient/family Time For Goal Achievement: 12/13/19 Potential to Achieve Goals: Good Progress towards PT goals: Progressing toward goals    Frequency    Min 3X/week      PT Plan Current plan remains appropriate    Co-evaluation               AM-PAC PT "6 Clicks" Mobility   Outcome Measure  Help needed turning from your back to your side while in a flat bed without using bedrails?: A Lot Help needed moving from lying on your back to sitting on the side of a flat bed without using bedrails?: A Lot Help needed moving to and from a bed to a chair (including a wheelchair)?: A Little Help needed standing up from a chair using your arms (e.g., wheelchair or bedside chair)?: A Little Help needed to walk in hospital room?: A Lot Help needed climbing 3-5 steps with a railing? : Total 6 Click Score: 13    End of Session Equipment Utilized During Treatment: Gait belt Activity Tolerance: Patient limited by fatigue;Patient limited by pain Patient left: in chair;with call bell/phone within reach;with chair alarm set;with family/visitor present Nurse Communication: Mobility status PT Visit Diagnosis: Unsteadiness on feet (R26.81);Muscle weakness (generalized) (M62.81)     Time: 1100-1120 PT Time Calculation (min) (ACUTE ONLY): 20 min  Charges:  $Therapeutic Activity: 8-22 mins                     Mackenzie Peterson, PT, DPT Acute Rehabilitation Services Pager [phone] Office [phone]    Mackenzie Peterson 12/04/2019, 12:28 PM

## 2019-12-04 NOTE — TOC Progression Note (Signed)
Transition of Care [facility]) - Progression Note    Patient Details  Name: Mackenzie Peterson MRN: [id number] Date of Birth: August 11, 1924  [facility] CM/SW Contact  Terrial Rhodes, LCSWA Phone Number: 12/04/2019, 1:12 PM  Clinical Narrative:     CSW received call from case manager to cancel PTAR. Decided to keep patient another night. CSW called [facility] and cancelled transport. CSW called Tresa Endo with [facility] to let her know patient will not be discharging today. Tresa Endo confirmed she can accept patient tomorrow.CSW called patients son Mackenzie Peterson and updated patients son that patient will not be discharging today.  CSW will continue to follow.  Expected Discharge Plan: Skilled Nursing Facility Barriers to Discharge: No Barriers Identified  Expected Discharge Plan and Services Expected Discharge Plan: Skilled Nursing Facility In-house Referral: Clinical Social Work   Post Acute Care Choice: Skilled Nursing Facility Living arrangements for the past 2 months: Single Family Home Expected Discharge Date: 12/04/19                                     Social Determinants of Health (SDOH) Interventions    Readmission Risk Interventions No flowsheet data found.

## 2019-12-04 NOTE — Progress Notes (Signed)
Nutrition Follow-up  DOCUMENTATION CODES:   Not applicable  INTERVENTION:   -Magic cup TID with meals, each supplement provides 290 kcal and 9 grams of protein -Continue MVI with minerals daily  NUTRITION DIAGNOSIS:   Increased nutrient needs related to post-op healing as evidenced by estimated needs.  Ongoing  GOAL:   Patient will meet greater than or equal to 90% of their needs  Progressing   MONITOR:   PO intake, Supplement acceptance, Labs, Weight trends, Skin, I & O's  REASON FOR ASSESSMENT:   Consult Hip fracture protocol  ASSESSMENT:   Mackenzie Peterson is a 86 y.o. female with medical history significant of alzheimer's, htn. Hyperlipidemia,hypothyroid, PVD seen in ed for Fall and rt hip fracture.  10/29- s/p PROCEDURE: Treatment of intertrochanteric, pertrochanteric, subtrochanteric fracture with intramedullary implant  Reviewed I/O's: +580 ml x 24 hours and +6.1L since admission  Pt receiving nursing care at time of visit.   Per chart review, pt with poor appetite. Noted pt refused to eat breakfast on 12/02/19. Meal completion documented at 25-50%. Ensure Enlive supplements d/c by MD on 12/01/19.   Per TOC notes, plan to dc to SNF [facility]) tomorrow (12/05/19).   Labs reviewed.   Diet Order:   Diet Order            Diet - low sodium heart healthy           Diet regular Room service appropriate? Yes; Fluid consistency: Thin  Diet effective now                 EDUCATION NEEDS:   No education needs have been identified at this time  Skin:  Skin Assessment: Skin Integrity Issues: Skin Integrity Issues:: Incisions Incisions: rt hip  Last BM:  12/04/19  Height:   Ht Readings from Last 1 Encounters:  11/28/19 5' (1.524 m)    Weight:   Wt Readings from Last 1 Encounters:  12/04/19 52.2 kg    Ideal Body Weight:  45.4 kg  BMI:  Body mass index is 22.48 kg/m.  Estimated Nutritional Needs:   Kcal:  1400-1600  Protein:  60-75  grams  Fluid:  > 1.4 L    Levada Schilling, RD, LDN, CDCES Registered Dietitian II Certified Diabetes Care and Education Specialist Please refer to [facility] for RD and/or RD on-call/weekend/after hours pager

## 2019-12-04 NOTE — Progress Notes (Addendum)
1300: Pt scheduled for D/C today but pt began spitting up food and mucous; stated she felt nauseous after eating and did not want to eat anymore; Decision was made to postpone discharge until tomorrow 11/5. Will continue to monitor pt.      1352:Removed Vicodan from Pyxis to attempt to give per MD; Pt refused medication at this time and requested to rest at this time. Will continue to monitor.

## 2019-12-04 NOTE — TOC Transition Note (Signed)
Transition of Care [facility]) - CM/SW Discharge Note   Patient Details  Name: Mackenzie Peterson MRN: [id number] Date of Birth: 04-07-1924  [facility] of [facility] CM/SW Contact:  Terrial Rhodes, LCSWA Phone Number: 12/04/2019, 11:34 AM   Clinical Narrative:     Patient will DC to: [facility]  Anticipated DC date: 12/04/2019  Family notified: Jonetta Speak  Transport by: Sharin Mons  ?  Per MD patient ready for DC to [facility] . RN, patient, patient's family, and facility notified of DC. Discharge Summary sent to facility. RN given number for report tele#[phone] [id number]. DC packet on chart. Ambulance transport requested for patient.  CSW signing off.  Final next level of care: Skilled Nursing Facility Barriers to Discharge: No Barriers Identified   Patient Goals and CMS Choice   CMS Medicare.gov Compare Post Acute Care list provided to:: Patient Represenative (must comment) (Son [place]) Choice offered to / list presented to : Adult Children (Son [place])  Discharge Placement              Patient chooses bed at: [facility] Patient to be transferred to facility by: [facility] Name of family member notified: Luis Patient and family notified of of transfer: 12/04/19  Discharge Plan and Services In-house Referral: Clinical Social Work   Post Acute Care Choice: Skilled Nursing Facility                               Social Determinants of Health (SDOH) Interventions     Readmission Risk Interventions No flowsheet data found.

## 2019-12-04 NOTE — Progress Notes (Signed)
OK to leave out IV and leave pt off tele per Dr Radonna Ricker.

## 2019-12-04 NOTE — Progress Notes (Signed)
D/C instructions printed and placed in packet at nurse's station with signed narcotic scripts.

## 2019-12-04 NOTE — Discharge Summary (Addendum)
Physician Discharge Summary  Mackenzie Peterson ZOX:[id number] DOB: Jun 17, 1924 DOA: 11/28/2019  PCP: [name], MD  Admit date: 11/28/2019 Discharge date: 12/05/2019  Admitted From: Home Disposition:  SNF  Recommendations for Outpatient Follow-up:  1. Follow up with PCP in 1-2 weeks 2. Please obtain BMP/CBC in one week   Home Health:No Equipment/Devices:None  Discharge Condition:Stable CODE STATUS:Full Diet recommendation: Heart Healthy   Brief/Interim Summary: 86 y.o. female mostly Spanish-speaking with a history of Alzheimer's hypothyroidism comes into the hospital for a fall and a right hip fracture at home.  Discharge Diagnoses:  Principal Problem:   Intertrochanteric fracture of right femur (HCC) Active Problems:   Hypothyroidism   Mild persistent chronic asthma without complication   Delirium   Closed right hip fracture, initial encounter (HCC)  Intertrochanteric femur right fracture: Status post intramedullary nailing by orthopedic surgery who recommended surgical intervention. They also recommended Lovenox for DVT prophylaxis.  No signs of overt bleeding. Drop in hemoglobin likely due to surgical intervention. Physical therapy evaluated the Mackenzie and recommended skilled nursing facility. Continue tramadol for pain which seems to be controlling it.  New onset Diarrhea: X-ray was unrevealing her leukocytosis has resolved along with her diarrhea question likely due to antibiotics. No melanotic stools.  Alzheimer's dementia/sundowning: At home she is currently on Xanax changes in house question likely due to polypharmacy.  Mental status change: MRI was negative neurology recommended an EEG that showed no elliptic source. Neurology recommended to start Keppra twice a day for possible seizures.  Hypothyroidism: Continue Synthroid.  Leukocytosis possibly due to UTI: Her UA did show many bacteria and 10-20 white blood cells. We will treat her empirically with  oral Keflex will send for urine culture.  She is complaining of suprapubic tenderness and dysuria.    Discharge Instructions  Discharge Instructions    Diet - low sodium heart healthy   Complete by: As directed    Increase activity slowly   Complete by: As directed    No wound care   Complete by: As directed      Allergies as of 12/05/2019   No Known Allergies     Medication List    TAKE these medications   acetaminophen 500 MG tablet Commonly known as: TYLENOL Take 500-1,000 mg by mouth every 6 (six) hours as needed for mild pain or headache.   albuterol (2.5 MG/3ML) 0.083% nebulizer solution Commonly known as: PROVENTIL Take 2.5 mg by nebulization 3 (three) times daily as needed for wheezing or shortness of breath.   ALPRAZolam 0.5 MG tablet Commonly known as: XANAX Take 1 tablet (0.5 mg total) by mouth 3 (three) times daily as needed for anxiety.   aspirin 81 MG tablet Take 81 mg by mouth daily.   cephALEXin 500 MG capsule Commonly known as: KEFLEX Take 1 capsule (500 mg total) by mouth every 8 (eight) hours for 3 days.   cetirizine 10 MG tablet Commonly known as: ZYRTEC Take 10 mg by mouth at bedtime.   donepezil 10 MG tablet Commonly known as: ARICEPT Take 10 mg by mouth at bedtime.   enoxaparin 30 MG/0.3ML injection Commonly known as: LOVENOX Inject 0.3 mLs (30 mg total) into the skin daily.   levETIRAcetam 250 MG tablet Commonly known as: KEPPRA Take 1 tablet (250 mg total) by mouth 2 (two) times daily.   levothyroxine 25 MCG tablet Commonly known as: SYNTHROID Take 25 mcg by mouth daily before breakfast.   memantine 10 MG tablet Commonly known as: NAMENDA Take 10 mg  by mouth 2 (two) times daily.   nitroGLYCERIN 0.4 MG SL tablet Commonly known as: NITROSTAT Place 0.4 mg under the tongue every 5 (five) minutes as needed for chest pain.   omeprazole 20 MG capsule Commonly known as: PRILOSEC TAKE 1 CAPSULE BY MOUTH 30 TO 60 MINUTES BEFORE THE  FIRST AND LAST MEALS OF THE DAY What changed: See the new instructions.   One-A-Day Proactive 65+ Tabs Take 1 tablet by mouth daily with breakfast.   OXYGEN Inhale 2 L/min into the lungs at bedtime.   pravastatin 20 MG tablet Commonly known as: PRAVACHOL Take 20 mg by mouth at bedtime.   QUEtiapine 50 MG tablet Commonly known as: SEROQUEL Take 50 mg by mouth 2 (two) times daily.   sertraline 25 MG tablet Commonly known as: ZOLOFT Take 25 mg by mouth at bedtime.   solifenacin 5 MG tablet Commonly known as: VESICARE Take 5 mg by mouth at bedtime.   traMADol 50 MG tablet Commonly known as: Ultram Take 1 tablet (50 mg total) by mouth every 6 (six) hours as needed for up to 25 doses for moderate pain.   traZODone 50 MG tablet Commonly known as: DESYREL Take 50 mg by mouth at bedtime.       Contact information for follow-up providers    Yolonda Kida, MD In 2 weeks.   Specialty: Orthopedic Surgery Why: GO: NOV. 18  AT 3:45PM Contact information: [address] [place] [address] [phone]            Contact information for after-discharge care    Destination    [facility] Preferred SNF.   Service: Skilled Nursing Why: WILL SCHEDULE ONCE IN THE BUILDING Contact information: [address] [place] [address] [phone]                 No Known Allergies  Consultations:  Orthopedic surgery   Procedures/Studies: DG Knee 2 Views Right  Result Date: 11/28/2019 CLINICAL DATA:  Larey Seat.  Right hip pain. EXAM: RIGHT KNEE - 1-2 VIEW COMPARISON:  None. FINDINGS: The right knee joint is maintained. No acute fracture is identified. IMPRESSION: No acute bony findings. Electronically Signed   By: Rudie Meyer M.D.   On: 11/28/2019 14:51   CT Head Wo Contrast  Result Date: 11/28/2019 CLINICAL DATA:  Neck trauma. Mackenzie fell in was seen yesterday. Mackenzie had another fall today. EXAM: CT HEAD WITHOUT  CONTRAST CT CERVICAL SPINE WITHOUT CONTRAST TECHNIQUE: Multidetector CT imaging of the head and cervical spine was performed following the standard protocol without intravenous contrast. Multiplanar CT image reconstructions of the cervical spine were also generated. COMPARISON:  11/26/2019 FINDINGS: CT HEAD FINDINGS Brain: There is minimal periventricular white matter change. There is no intra or extra-axial fluid collection or mass lesion. The basilar cisterns and ventricles have a normal appearance. There is no CT evidence for acute infarction or hemorrhage. Vascular: There is atherosclerotic calcification of the internal carotid arteries. No hyperdense vessels. Skull: Normal. Negative for fracture or focal lesion. Sinuses/Orbits: No acute finding. Other: None CT CERVICAL SPINE FINDINGS There is significant Mackenzie motion artifact. Alignment: There is trace, stable anterolisthesis of C3 on C4 and C7 on T1. Trace retrolisthesis of C5 on C6. Otherwise alignment is normal. Skull base and vertebrae: No acute fracture. No primary bone lesion or focal pathologic process. Soft tissues and spinal canal: No prevertebral fluid or swelling. No visible canal hematoma. Disc levels: Moderate disc height loss and uncovertebral spurring  primarily at C4-5, C5-6, C6-7. Upper chest: Negative. Other: None IMPRESSION: 1. No evidence for acute intracranial abnormality. 2. Minimal periventricular white matter changes. 3. Mackenzie motion artifact. 4. Moderate mid cervical degenerative changes. 5. No evidence for acute cervical spine abnormality. Electronically Signed   By: Norva Pavlov M.D.   On: 11/28/2019 14:36   CT Head Wo Contrast  Result Date: 11/26/2019 CLINICAL DATA:  Poly trauma, critical, head/cervical spine injury suspected. EXAM: CT HEAD WITHOUT CONTRAST CT MAXILLOFACIAL WITHOUT CONTRAST CT CERVICAL SPINE WITHOUT CONTRAST TECHNIQUE: Multidetector CT imaging of the head, cervical spine, and maxillofacial structures were  performed using the standard protocol without intravenous contrast. Multiplanar CT image reconstructions of the cervical spine and maxillofacial structures were also generated. COMPARISON:  Head CT 05/07/2019.  CT cervical spine 05/07/2019 FINDINGS: CT HEAD FINDINGS Brain: Cerebral volume is normal for age. Unchanged subcentimeter focus of calcification within the left parietooccipital lobes (series 6, image 25). There is no acute intracranial hemorrhage. No demarcated cortical infarct. No extra-axial fluid collection. No evidence of intracranial mass. No midline shift. Vascular: No hyperdense vessel.  Atherosclerotic calcifications. Skull: Normal. Negative for fracture or focal lesion. Other: No significant mastoid effusion. CT MAXILLOFACIAL FINDINGS The examination is mildly motion degraded. Osseous: No acute maxillofacial fracture is identified. Orbits: No acute finding. The globes are normal in size and contour. The extraocular muscles and optic nerve sheath complexes are symmetric and unremarkable. Sinuses: No significant paranasal sinus disease. Soft tissues: Unremarkable. No appreciable soft tissue swelling by CT. CT CERVICAL SPINE FINDINGS Alignment: Cervical levocurvature. Trace C3-C4, C4-C5 and C7-T1 grade 1 anterolisthesis. Skull base and vertebrae: The basion-dental and atlanto-dental intervals are maintained.No evidence of acute fracture to the cervical spine. Soft tissues and spinal canal: No prevertebral fluid or swelling. No visible canal hematoma. Disc levels: Cervical spondylosis. Most notably at C5-C6, there is advanced disc space narrowing with a posterior disc osteophyte complex and uncovertebral hypertrophy. No high-grade bony spinal canal stenosis. Upper chest: Similar to the prior examination 05/07/2019, there is extensive nodularity with associated tree-in-bud opacities within the imaged lung apices. No visible pneumothorax. IMPRESSION: CT head: No evidence of acute intracranial abnormality.  CT maxillofacial: 1. Mildly motion degraded examination. 2. No evidence of acute maxillofacial fracture. CT cervical spine: 1. No evidence of acute fracture to the cervical spine. 2. A cervical levocurvature may be positional. 3. Mild C3-C4, C4-C5 and C7-T1 grade 1 anterolisthesis. 4. Cervical spondylosis as described and greatest at C5-C6. 5. Similar to the prior cervical spine CT of 05/07/2019, there is extensive nodularity and tree-in-bud opacities in the imaged lung apices. These findings are nonspecific but are favored to reflect postinflammatory nodules and/or chronic atypical infection. Electronically Signed   By: Jackey Loge DO   On: 11/26/2019 14:47   CT ANGIO CHEST PE W OR WO CONTRAST  Result Date: 11/26/2019 CLINICAL DATA:  86 year old female with concern for pulmonary embolism. EXAM: CT ANGIOGRAPHY CHEST WITH CONTRAST TECHNIQUE: Multidetector CT imaging of the chest was performed using the standard protocol during bolus administration of intravenous contrast. Multiplanar CT image reconstructions and MIPs were obtained to evaluate the vascular anatomy. CONTRAST:  OMNIPAQUE IOHEXOL 300 MG/ML  SOLN COMPARISON:  Chest CT dated 04/30/2019. FINDINGS: Cardiovascular: There is no cardiomegaly or pericardial effusion. Coronary vascular calcification primarily involving the LAD. There is calcification of the mitral annulus. Moderate atherosclerotic calcification of the thoracic aorta. No aneurysmal dilatation or dissection. Mild dilatation of main pulmonary trunk suggestive of a degree of pulmonary hypertension. Clinical correlation  is recommended. No pulmonary artery embolus identified. Mediastinum/Nodes: There is no hilar or mediastinal adenopathy. Small right hilar calcified granuloma. The esophagus is grossly unremarkable. No mediastinal fluid collection. Lungs/Pleura: Extensive bilateral pulmonary nodules as seen on the prior CT. No new consolidation. There is no pleural effusion pneumothorax. The  central airways are patent. Upper Abdomen: Bilateral adrenal thickening/hyperplasia versus adenoma. Musculoskeletal: Osteopenia with degenerative changes of the spine. No acute osseous pathology. Review of the MIP images confirms the above findings. IMPRESSION: 1. No acute intrathoracic pathology. No CT evidence of pulmonary artery embolus. 2. Extensive bilateral pulmonary nodules as seen on the prior CT. 3. Aortic Atherosclerosis (ICD10-I70.0). Electronically Signed   By: Elgie Collard M.D.   On: 11/26/2019 23:07   CT Cervical Spine Wo Contrast  Result Date: 11/28/2019 CLINICAL DATA:  Neck trauma. Mackenzie fell in was seen yesterday. Mackenzie had another fall today. EXAM: CT HEAD WITHOUT CONTRAST CT CERVICAL SPINE WITHOUT CONTRAST TECHNIQUE: Multidetector CT imaging of the head and cervical spine was performed following the standard protocol without intravenous contrast. Multiplanar CT image reconstructions of the cervical spine were also generated. COMPARISON:  11/26/2019 FINDINGS: CT HEAD FINDINGS Brain: There is minimal periventricular white matter change. There is no intra or extra-axial fluid collection or mass lesion. The basilar cisterns and ventricles have a normal appearance. There is no CT evidence for acute infarction or hemorrhage. Vascular: There is atherosclerotic calcification of the internal carotid arteries. No hyperdense vessels. Skull: Normal. Negative for fracture or focal lesion. Sinuses/Orbits: No acute finding. Other: None CT CERVICAL SPINE FINDINGS There is significant Mackenzie motion artifact. Alignment: There is trace, stable anterolisthesis of C3 on C4 and C7 on T1. Trace retrolisthesis of C5 on C6. Otherwise alignment is normal. Skull base and vertebrae: No acute fracture. No primary bone lesion or focal pathologic process. Soft tissues and spinal canal: No prevertebral fluid or swelling. No visible canal hematoma. Disc levels: Moderate disc height loss and uncovertebral spurring  primarily at C4-5, C5-6, C6-7. Upper chest: Negative. Other: None IMPRESSION: 1. No evidence for acute intracranial abnormality. 2. Minimal periventricular white matter changes. 3. Mackenzie motion artifact. 4. Moderate mid cervical degenerative changes. 5. No evidence for acute cervical spine abnormality. Electronically Signed   By: Norva Pavlov M.D.   On: 11/28/2019 14:36   CT Cervical Spine Wo Contrast  Result Date: 11/26/2019 CLINICAL DATA:  Poly trauma, critical, head/cervical spine injury suspected. EXAM: CT HEAD WITHOUT CONTRAST CT MAXILLOFACIAL WITHOUT CONTRAST CT CERVICAL SPINE WITHOUT CONTRAST TECHNIQUE: Multidetector CT imaging of the head, cervical spine, and maxillofacial structures were performed using the standard protocol without intravenous contrast. Multiplanar CT image reconstructions of the cervical spine and maxillofacial structures were also generated. COMPARISON:  Head CT 05/07/2019.  CT cervical spine 05/07/2019 FINDINGS: CT HEAD FINDINGS Brain: Cerebral volume is normal for age. Unchanged subcentimeter focus of calcification within the left parietooccipital lobes (series 6, image 25). There is no acute intracranial hemorrhage. No demarcated cortical infarct. No extra-axial fluid collection. No evidence of intracranial mass. No midline shift. Vascular: No hyperdense vessel.  Atherosclerotic calcifications. Skull: Normal. Negative for fracture or focal lesion. Other: No significant mastoid effusion. CT MAXILLOFACIAL FINDINGS The examination is mildly motion degraded. Osseous: No acute maxillofacial fracture is identified. Orbits: No acute finding. The globes are normal in size and contour. The extraocular muscles and optic nerve sheath complexes are symmetric and unremarkable. Sinuses: No significant paranasal sinus disease. Soft tissues: Unremarkable. No appreciable soft tissue swelling by CT. CT CERVICAL SPINE FINDINGS  Alignment: Cervical levocurvature. Trace C3-C4, C4-C5 and C7-T1  grade 1 anterolisthesis. Skull base and vertebrae: The basion-dental and atlanto-dental intervals are maintained.No evidence of acute fracture to the cervical spine. Soft tissues and spinal canal: No prevertebral fluid or swelling. No visible canal hematoma. Disc levels: Cervical spondylosis. Most notably at C5-C6, there is advanced disc space narrowing with a posterior disc osteophyte complex and uncovertebral hypertrophy. No high-grade bony spinal canal stenosis. Upper chest: Similar to the prior examination 05/07/2019, there is extensive nodularity with associated tree-in-bud opacities within the imaged lung apices. No visible pneumothorax. IMPRESSION: CT head: No evidence of acute intracranial abnormality. CT maxillofacial: 1. Mildly motion degraded examination. 2. No evidence of acute maxillofacial fracture. CT cervical spine: 1. No evidence of acute fracture to the cervical spine. 2. A cervical levocurvature may be positional. 3. Mild C3-C4, C4-C5 and C7-T1 grade 1 anterolisthesis. 4. Cervical spondylosis as described and greatest at C5-C6. 5. Similar to the prior cervical spine CT of 05/07/2019, there is extensive nodularity and tree-in-bud opacities in the imaged lung apices. These findings are nonspecific but are favored to reflect postinflammatory nodules and/or chronic atypical infection. Electronically Signed   By: Jackey Loge DO   On: 11/26/2019 14:47   MR BRAIN WO CONTRAST  Result Date: 11/27/2019 CLINICAL DATA:  TIA.  Delirium. EXAM: MRI HEAD WITHOUT CONTRAST TECHNIQUE: Multiplanar, multiecho pulse sequences of the brain and surrounding structures were obtained without intravenous contrast. COMPARISON:  CT head 11/26/2019 FINDINGS: Brain: Ventricle size and cerebral volume normal for age. Mild hyperintensity in the periventricular and deep white matter bilaterally. Two areas of chronic microhemorrhage in the parietal lobes bilaterally. Negative for mass or edema. Negative for acute infarct.  Vascular: Normal arterial flow voids. Skull and upper cervical spine: Negative Sinuses/Orbits: Mild mucosal edema paranasal sinuses. Bilateral cataract extraction Other: None IMPRESSION: No acute abnormality Mild white matter changes, typical for age. Electronically Signed   By: Marlan Palau M.D.   On: 11/27/2019 13:34   DG Chest Port 1 View  Result Date: 11/26/2019 CLINICAL DATA:  Altered mental status. EXAM: PORTABLE CHEST 1 VIEW COMPARISON:  May 07, 2019 FINDINGS: No substantial change in innumerable pulmonary nodules bilaterally. No focal consolidation. No visible pleural effusion or pneumothorax. Similar eventration of the left medial hemidiaphragm. Cardiomediastinal silhouette is within normal limits. Aortic atherosclerosis. No acute osseous abnormality. IMPRESSION: No significant interval change in diffuse bilateral pulmonary nodules. Electronically Signed   By: Feliberto Harts MD   On: 11/26/2019 13:46   DG Abd Portable 2V  Result Date: 12/01/2019 CLINICAL DATA:  Diarrhea. EXAM: PORTABLE ABDOMEN - 2 VIEW COMPARISON:  None. FINDINGS: The bowel gas pattern is normal. There is no evidence of free air. No radio-opaque calculi or other significant radiographic abnormality is seen. IMPRESSION: Negative. Electronically Signed   By: Lupita Raider M.D.   On: 12/01/2019 10:14   EEG adult  Result Date: 11/27/2019 Charlsie Quest, MD     11/27/2019 10:13 AM Mackenzie Peterson MRN: [id number] Epilepsy Attending: Charlsie Quest Referring Physician/Provider: Dr. Candelaria Stagers Date: 11/27/2019 Duration: 23.40 mins Mackenzie history: 86 year old female with altered mental status.  EEG evaluate for seizures. Level of alertness: Awake, asleep AEDs during EEG study: None Technical aspects: This EEG study was done with scalp electrodes positioned according to the 10-20 International system of electrode placement. Electrical activity was acquired at a sampling rate of 500Hz  and reviewed with a high  frequency filter of 70Hz  and a low frequency filter of  . EEG data were recorded continuously and digitally stored. Description: The posterior dominant rhythm consists of 8 Hz activity of moderate voltage (25-35 uV) seen predominantly in posterior head regions, symmetric and reactive to eye opening and eye closing. Sleep was characterized by vertex waves, sleep spindles (12 to 14 Hz), maximal frontocentral region.  EEG showed intermittent generalized 3 to 6 Hz theta-delta slowing.  Hyperventilation and photic stimulation were not performed.   ABNORMALITY -Intermittent slow, generalized IMPRESSION: This study is suggestive of mild diffuse encephalopathy, nonspecific etiology. No seizures or epileptiform discharges were seen throughout the recording. Charlsie Quest   DG C-Arm 1-60 Min  Result Date: 11/28/2019 CLINICAL DATA:  Status post intramedullary nail placement EXAM: DG C-ARM 1-60 MIN FLUOROSCOPY TIME:  Fluoroscopy Time:  1 minutes and 10 seconds Radiation Exposure Index (if provided by the fluoroscopic device): 8.1 mGy Number of Acquired Spot Images: 4 COMPARISON:  11/28/2019 FINDINGS: The Mackenzie has undergone intramedullary nail placement. There are expected postsurgical changes. The alignment is improved. The hardware is intact. IMPRESSION: Status post intramedullary nail placement. No evidence of hardware complication. Electronically Signed   By: Katherine Mantle M.D.   On: 11/28/2019 20:23   ECHOCARDIOGRAM COMPLETE  Result Date: 11/27/2019    ECHOCARDIOGRAM REPORT   Mackenzie Name:   Mackenzie Peterson Date of Exam: 11/27/2019 Medical Rec #:  [id number]     Height:       59.0 in Accession #:    [id number]    Weight:       110.2 lb Date of Birth:  1924/09/22     BSA:          1.432 m Mackenzie Age:    89 years      BP:           124/52 mmHg Mackenzie Gender: F             HR:           65 bpm. Exam Location:  Inpatient Procedure: 2D Echo, Cardiac Doppler and Color Doppler Indications:    Cerebral  Vascular Accident [id number]  History:        Mackenzie has no prior history of Echocardiogram examinations.                 Risk Factors:Hypertension and Dyslipidemia. Hypothyroidism.  Sonographer:    Elmarie Shiley Dance Referring Phys: [id number] EKTA V PATEL IMPRESSIONS  1. Left ventricular ejection fraction, by estimation, is 65 to 70%. The left ventricle has hyperdynamic function. The left ventricle has no regional wall motion abnormalities. There is mild left ventricular hypertrophy. Left ventricular diastolic parameters are consistent with Grade I diastolic dysfunction (impaired relaxation).  2. Right ventricular systolic function is normal. The right ventricular size is normal. The estimated right ventricular systolic pressure is 21.7 mmHg.  3. Left atrial size was severely dilated.  4. The mitral valve is degenerative. Mild mitral valve regurgitation. No evidence of mitral stenosis. Moderate mitral annular calcification.  5. The aortic valve is tricuspid. Aortic valve regurgitation is trivial. No aortic stenosis is present.  6. The inferior vena cava is normal in size with greater than 50% respiratory variability, suggesting right atrial pressure of 3 mmHg. FINDINGS  Left Ventricle: Left ventricular ejection fraction, by estimation, is 65 to 70%. The left ventricle has hyperdynamic function. The left ventricle has no regional wall motion abnormalities. The left ventricular internal cavity size was normal in size. There is mild left ventricular hypertrophy. Left ventricular diastolic parameters are consistent with  Grade I diastolic dysfunction (impaired relaxation). Right Ventricle: The right ventricular size is normal. No increase in right ventricular wall thickness. Right ventricular systolic function is normal. The tricuspid regurgitant velocity is 2.16 m/s, and with an assumed right atrial pressure of 3 mmHg, the estimated right ventricular systolic pressure is 21.7 mmHg. Left Atrium: Left atrial size was severely  dilated. Right Atrium: Right atrial size was normal in size. Pericardium: There is no evidence of pericardial effusion. Mitral Valve: The mitral valve is degenerative in appearance. Moderate mitral annular calcification. Mild mitral valve regurgitation. No evidence of mitral valve stenosis. Tricuspid Valve: The tricuspid valve is normal in structure. Tricuspid valve regurgitation is trivial. Aortic Valve: The aortic valve is tricuspid. Aortic valve regurgitation is trivial. Aortic regurgitation PHT measures 515 msec. No aortic stenosis is present. Pulmonic Valve: The pulmonic valve was normal in structure. Pulmonic valve regurgitation is trivial. Aorta: The aortic root is normal in size and structure. Venous: The inferior vena cava is normal in size with greater than 50% respiratory variability, suggesting right atrial pressure of 3 mmHg. IAS/Shunts: No atrial level shunt detected by color flow Doppler.  LEFT VENTRICLE PLAX 2D LVIDd:         3.72 cm  Diastology LVIDs:         2.32 cm  LV e' medial:    3.59 cm/s LV PW:         1.17 cm  LV E/e' medial:  29.0 LV IVS:        1.02 cm  LV e' lateral:   4.47 cm/s LVOT diam:     1.80 cm  LV E/e' lateral: 23.3 LV SV:         84 LV SV Index:   59 LVOT Area:     2.54 cm  RIGHT VENTRICLE            IVC RV Basal diam:  2.33 cm    IVC diam: 1.56 cm RV S prime:     9.99 cm/s TAPSE (M-mode): 2.0 cm LEFT ATRIUM             Index       RIGHT ATRIUM           Index LA diam:        3.80 cm 2.65 cm/m  RA Area:     10.20 cm LA Vol (A2C):   78.7 ml 54.97 ml/m RA Volume:   22.80 ml  15.92 ml/m LA Vol (A4C):   89.9 ml 62.79 ml/m LA Biplane Vol: 83.3 ml 58.18 ml/m  AORTIC VALVE LVOT Vmax:   141.00 cm/s LVOT Vmean:  90.700 cm/s LVOT VTI:    0.330 m AI PHT:      515 msec  AORTA Ao Root diam: 2.70 cm Ao Asc diam:  3.00 cm MITRAL VALVE                TRICUSPID VALVE MV Area (PHT): 1.94 cm     TR Peak grad:   18.7 mmHg MV Decel Time: 391 msec     TR Vmax:        216.00 cm/s MV E velocity:  104.00 cm/s MV A velocity: 154.00 cm/s  SHUNTS MV E/A ratio:  0.68         Systemic VTI:  0.33 m                             Systemic Diam: 1.80 cm  Marca Ancona MD Electronically signed by Marca Ancona MD Signature Date/Time: 11/27/2019/4:07:46 PM    Final    DG Hip Unilat W or Wo Pelvis 2-3 Views Right  Result Date: 11/28/2019 CLINICAL DATA:  Larey Seat.  Right hip pain. EXAM: DG HIP (WITH OR WITHOUT PELVIS) 2-3V RIGHT COMPARISON:  None. FINDINGS: There is a displaced intertrochanteric fracture of the right hip. Both hips are normally located.  No pelvic fractures are identified. IMPRESSION: Displaced intertrochanteric fracture of the right hip. Electronically Signed   By: Rudie Meyer M.D.   On: 11/28/2019 14:50   DG FEMUR, MIN 2 VIEWS RIGHT  Result Date: 11/28/2019 CLINICAL DATA:  Status post intramedullary nail placement EXAM: RIGHT FEMUR 2 VIEWS COMPARISON:  11/28/2019 FINDINGS: The Mackenzie has undergone intramedullary nail placement. There are expected postsurgical changes. The alignment is improved. The hardware is intact. IMPRESSION: Status post intramedullary nail placement with improved alignment. No evidence of hardware complication. Electronically Signed   By: Katherine Mantle M.D.   On: 11/28/2019 20:24   DG Femur Min 2 Views Right  Result Date: 11/28/2019 CLINICAL DATA:  Larey Seat.  Right hip pain. EXAM: RIGHT FEMUR 2 VIEWS COMPARISON:  None. FINDINGS: There is a displaced intertrochanteric fracture of the right hip. The right hip is normally located. No pelvic fractures are identified. IMPRESSION: Displaced intertrochanteric fracture of the right hip. Electronically Signed   By: Rudie Meyer M.D.   On: 11/28/2019 14:47   CT Maxillofacial WO CM  Result Date: 11/26/2019 CLINICAL DATA:  Poly trauma, critical, head/cervical spine injury suspected. EXAM: CT HEAD WITHOUT CONTRAST CT MAXILLOFACIAL WITHOUT CONTRAST CT CERVICAL SPINE WITHOUT CONTRAST TECHNIQUE: Multidetector CT imaging of the  head, cervical spine, and maxillofacial structures were performed using the standard protocol without intravenous contrast. Multiplanar CT image reconstructions of the cervical spine and maxillofacial structures were also generated. COMPARISON:  Head CT 05/07/2019.  CT cervical spine 05/07/2019 FINDINGS: CT HEAD FINDINGS Brain: Cerebral volume is normal for age. Unchanged subcentimeter focus of calcification within the left parietooccipital lobes (series 6, image 25). There is no acute intracranial hemorrhage. No demarcated cortical infarct. No extra-axial fluid collection. No evidence of intracranial mass. No midline shift. Vascular: No hyperdense vessel.  Atherosclerotic calcifications. Skull: Normal. Negative for fracture or focal lesion. Other: No significant mastoid effusion. CT MAXILLOFACIAL FINDINGS The examination is mildly motion degraded. Osseous: No acute maxillofacial fracture is identified. Orbits: No acute finding. The globes are normal in size and contour. The extraocular muscles and optic nerve sheath complexes are symmetric and unremarkable. Sinuses: No significant paranasal sinus disease. Soft tissues: Unremarkable. No appreciable soft tissue swelling by CT. CT CERVICAL SPINE FINDINGS Alignment: Cervical levocurvature. Trace C3-C4, C4-C5 and C7-T1 grade 1 anterolisthesis. Skull base and vertebrae: The basion-dental and atlanto-dental intervals are maintained.No evidence of acute fracture to the cervical spine. Soft tissues and spinal canal: No prevertebral fluid or swelling. No visible canal hematoma. Disc levels: Cervical spondylosis. Most notably at C5-C6, there is advanced disc space narrowing with a posterior disc osteophyte complex and uncovertebral hypertrophy. No high-grade bony spinal canal stenosis. Upper chest: Similar to the prior examination 05/07/2019, there is extensive nodularity with associated tree-in-bud opacities within the imaged lung apices. No visible pneumothorax. IMPRESSION:  CT head: No evidence of acute intracranial abnormality. CT maxillofacial: 1. Mildly motion degraded examination. 2. No evidence of acute maxillofacial fracture. CT cervical spine: 1. No evidence of acute fracture to the cervical spine. 2. A cervical levocurvature may be positional. 3. Mild C3-C4, C4-C5 and  C7-T1 grade 1 anterolisthesis. 4. Cervical spondylosis as described and greatest at C5-C6. 5. Similar to the prior cervical spine CT of 05/07/2019, there is extensive nodularity and tree-in-bud opacities in the imaged lung apices. These findings are nonspecific but are favored to reflect postinflammatory nodules and/or chronic atypical infection. Electronically Signed   By: Jackey Loge DO   On: 11/26/2019 14:47   (Echo, Carotid, EGD, Colonoscopy, ERCP)    Subjective: No new complaints feels great.  Discharge Exam: Vitals:   12/04/19 2029 12/05/19 0329  BP: (!) 120/52 (!) 110/56  Pulse: 82 93  Resp: 17 18  Temp: 97.9 F (36.6 C) 98.4 F (36.9 C)  SpO2: 96% 98%   Vitals:   12/04/19 1235 12/04/19 2029 12/05/19 0329 12/05/19 0523  BP: (!) 139/53 (!) 120/52 (!) 110/56   Pulse: 89 82 93   Resp: 14 17 18    Temp: 98.5 F (36.9 C) 97.9 F (36.6 C) 98.4 F (36.9 C)   TempSrc: Oral Oral Oral   SpO2:  96% 98%   Weight:   48.1 kg 50.8 kg  Height:        General: Pt is alert, awake, not in acute distress Cardiovascular: RRR, S1/S2 +, no rubs, no gallops Respiratory: CTA bilaterally, no wheezing, no rhonchi Abdominal: Soft, NT, ND, bowel sounds + Extremities: no edema, no cyanosis    The results of significant diagnostics from this hospitalization (including imaging, microbiology, ancillary and laboratory) are listed below for reference.     Microbiology: Recent Results (from the past 240 hour(s))  Urine culture     Status: None   Collection Time: 11/26/19 12:58 PM   Specimen: Urine, Catheterized  Result Value Ref Range Status   Specimen Description URINE, CATHETERIZED  Final    Special Requests NONE  Final   Culture   Final    NO GROWTH Performed at [facility], [address], [place], [place] [address]    Report Status 11/27/2019 FINAL  Final  Respiratory Panel by RT PCR (Flu A&B, Covid) - Nasopharyngeal Swab     Status: None   Collection Time: 11/26/19  5:06 PM   Specimen: Nasopharyngeal Swab  Result Value Ref Range Status   SARS Coronavirus 2 by RT PCR NEGATIVE NEGATIVE Final    Comment: (NOTE) SARS-CoV-2 target nucleic acids are NOT DETECTED.  The SARS-CoV-2 RNA is generally detectable in upper respiratoy specimens during the acute phase of infection. The lowest concentration of SARS-CoV-2 viral copies this assay can detect is 131 copies/mL. A negative result does not preclude SARS-Cov-2 infection and should not be used as the sole basis for treatment or other Mackenzie management decisions. A negative result may occur with  improper specimen collection/handling, submission of specimen other than nasopharyngeal swab, presence of viral mutation(s) within the areas targeted by this assay, and inadequate number of viral copies (<131 copies/mL). A negative result must be combined with clinical observations, Mackenzie history, and epidemiological information. The expected result is Negative.  Fact Sheet for Patients:  https://www.moore.com/  Fact Sheet for Healthcare Providers:  https://www.young.biz/  This test is no t yet approved or cleared by the [place] FDA and  has been authorized for detection and/or diagnosis of SARS-CoV-2 by FDA under an Emergency Use Authorization (EUA). This EUA will remain  in effect (meaning this test can be used) for the duration of the COVID-19 declaration under Section 564(b)(1) of the Act, 21 U.S.C. section 360bbb-3(b)(1), unless the authorization is terminated or revoked sooner.  Influenza A by PCR NEGATIVE NEGATIVE Final   Influenza B by PCR NEGATIVE NEGATIVE  Final    Comment: (NOTE) The Xpert Xpress SARS-CoV-2/FLU/RSV assay is intended as an aid in  the diagnosis of influenza from Nasopharyngeal swab specimens and  should not be used as a sole basis for treatment. Nasal washings and  aspirates are unacceptable for Xpert Xpress SARS-CoV-2/FLU/RSV  testing.  Fact Sheet for Patients: https://www.moore.com/  Fact Sheet for Healthcare Providers: https://www.young.biz/  This test is not yet approved or cleared by the [place] FDA and  has been authorized for detection and/or diagnosis of SARS-CoV-2 by  FDA under an Emergency Use Authorization (EUA). This EUA will remain  in effect (meaning this test can be used) for the duration of the  Covid-19 declaration under Section 564(b)(1) of the Act, 21  U.S.C. section 360bbb-3(b)(1), unless the authorization is  terminated or revoked. Performed at [facility], [address], [place], [place] [address]   Respiratory Panel by RT PCR (Flu A&B, Covid) - Nasopharyngeal Swab     Status: None   Collection Time: 11/28/19  4:04 PM   Specimen: Nasopharyngeal Swab  Result Value Ref Range Status   SARS Coronavirus 2 by RT PCR NEGATIVE NEGATIVE Final    Comment: (NOTE) SARS-CoV-2 target nucleic acids are NOT DETECTED.  The SARS-CoV-2 RNA is generally detectable in upper respiratoy specimens during the acute phase of infection. The lowest concentration of SARS-CoV-2 viral copies this assay can detect is 131 copies/mL. A negative result does not preclude SARS-Cov-2 infection and should not be used as the sole basis for treatment or other Mackenzie management decisions. A negative result may occur with  improper specimen collection/handling, submission of specimen other than nasopharyngeal swab, presence of viral mutation(s) within the areas targeted by this assay, and inadequate number of viral copies (<131 copies/mL). A negative result must be combined with  clinical observations, Mackenzie history, and epidemiological information. The expected result is Negative.  Fact Sheet for Patients:  https://www.moore.com/  Fact Sheet for Healthcare Providers:  https://www.young.biz/  This test is no t yet approved or cleared by the [place] FDA and  has been authorized for detection and/or diagnosis of SARS-CoV-2 by FDA under an Emergency Use Authorization (EUA). This EUA will remain  in effect (meaning this test can be used) for the duration of the COVID-19 declaration under Section 564(b)(1) of the Act, 21 U.S.C. section 360bbb-3(b)(1), unless the authorization is terminated or revoked sooner.     Influenza A by PCR NEGATIVE NEGATIVE Final   Influenza B by PCR NEGATIVE NEGATIVE Final    Comment: (NOTE) The Xpert Xpress SARS-CoV-2/FLU/RSV assay is intended as an aid in  the diagnosis of influenza from Nasopharyngeal swab specimens and  should not be used as a sole basis for treatment. Nasal washings and  aspirates are unacceptable for Xpert Xpress SARS-CoV-2/FLU/RSV  testing.  Fact Sheet for Patients: https://www.moore.com/  Fact Sheet for Healthcare Providers: https://www.young.biz/  This test is not yet approved or cleared by the [place] FDA and  has been authorized for detection and/or diagnosis of SARS-CoV-2 by  FDA under an Emergency Use Authorization (EUA). This EUA will remain  in effect (meaning this test can be used) for the duration of the  Covid-19 declaration under Section 564(b)(1) of the Act, 21  U.S.C. section 360bbb-3(b)(1), unless the authorization is  terminated or revoked. Performed at [facility], [address] [place], [place] [address]   SARS Coronavirus  2 by RT PCR (hospital order, performed in [facility] hospital lab) Nasopharyngeal Nasopharyngeal Swab     Status: None   Collection Time: 12/02/19  2:30 PM    Specimen: Nasopharyngeal Swab  Result Value Ref Range Status   SARS Coronavirus 2 NEGATIVE NEGATIVE Final    Comment: (NOTE) SARS-CoV-2 target nucleic acids are NOT DETECTED.  The SARS-CoV-2 RNA is generally detectable in upper and lower respiratory specimens during the acute phase of infection. The lowest concentration of SARS-CoV-2 viral copies this assay can detect is 250 copies / mL. A negative result does not preclude SARS-CoV-2 infection and should not be used as the sole basis for treatment or other Mackenzie management decisions.  A negative result may occur with improper specimen collection / handling, submission of specimen other than nasopharyngeal swab, presence of viral mutation(s) within the areas targeted by this assay, and inadequate number of viral copies (<250 copies / mL). A negative result must be combined with clinical observations, Mackenzie history, and epidemiological information.  Fact Sheet for Patients:   BoilerBrush.com.cy  Fact Sheet for Healthcare Providers: https://pope.com/  This test is not yet approved or  cleared by the [place] FDA and has been authorized for detection and/or diagnosis of SARS-CoV-2 by FDA under an Emergency Use Authorization (EUA).  This EUA will remain in effect (meaning this test can be used) for the duration of the COVID-19 declaration under Section 564(b)(1) of the Act, 21 U.S.C. section 360bbb-3(b)(1), unless the authorization is terminated or revoked sooner.  Performed at [facility], [address], [place], [place] [address]   SARS Coronavirus 2 by RT PCR (hospital order, performed in [facility] hospital lab) Nasopharyngeal Nasopharyngeal Swab     Status: None   Collection Time: 12/05/19  9:34 AM   Specimen: Nasopharyngeal Swab  Result Value Ref Range Status   SARS Coronavirus 2 NEGATIVE NEGATIVE Final    Comment: (NOTE) SARS-CoV-2 target nucleic acids are NOT  DETECTED.  The SARS-CoV-2 RNA is generally detectable in upper and lower respiratory specimens during the acute phase of infection. The lowest concentration of SARS-CoV-2 viral copies this assay can detect is 250 copies / mL. A negative result does not preclude SARS-CoV-2 infection and should not be used as the sole basis for treatment or other Mackenzie management decisions.  A negative result may occur with improper specimen collection / handling, submission of specimen other than nasopharyngeal swab, presence of viral mutation(s) within the areas targeted by this assay, and inadequate number of viral copies (<250 copies / mL). A negative result must be combined with clinical observations, Mackenzie history, and epidemiological information.  Fact Sheet for Patients:   BoilerBrush.com.cy  Fact Sheet for Healthcare Providers: https://pope.com/  This test is not yet approved or  cleared by the [place] FDA and has been authorized for detection and/or diagnosis of SARS-CoV-2 by FDA under an Emergency Use Authorization (EUA).  This EUA will remain in effect (meaning this test can be used) for the duration of the COVID-19 declaration under Section 564(b)(1) of the Act, 21 U.S.C. section 360bbb-3(b)(1), unless the authorization is terminated or revoked sooner.  Performed at [facility], [address], [place], [place] [address]      Labs: BNP (last 3 results) No results for input(s): BNP in the last 8760 hours. Basic Metabolic Panel: Recent Labs  Lab 11/28/19 1543 11/28/19 1543 11/30/19 0906 12/01/19 0457 12/01/19 2113 12/02/19 0355 12/05/19 0214  NA 142  --  138  141 140 139  --   K 3.7  --  3.8 2.6* 4.0 3.7  --   CL 109  --  105 108 109 111  --   CO2 19*  --  28 25 25 24   --   GLUCOSE 118*  --  124* 121* 108* 98  --   BUN 20  --  19 23 23 21   --   CREATININE 0.96   < > 0.88 0.85 0.74 0.71 0.73  CALCIUM 8.5*  --   7.6* 7.9* 7.4* 7.3*  --   MG  --   --   --  1.9  --   --   --    < > = values in this interval not displayed.   Liver Function Tests: No results for input(s): AST, ALT, ALKPHOS, BILITOT, PROT, ALBUMIN in the last 168 hours. No results for input(s): LIPASE, AMYLASE in the last 168 hours. No results for input(s): AMMONIA in the last 168 hours. CBC: Recent Labs  Lab 11/28/19 1543 11/28/19 1543 11/29/19 1149 11/29/19 1149 11/30/19 0906 11/30/19 2128 12/01/19 0457 12/02/19 0355 12/04/19 0244  WBC 19.7*   < > 11.5*  --  10.2  --  12.4* 14.8* 12.8*  NEUTROABS 17.4*  --   --   --   --   --   --   --  10.2*  HGB 13.7   < > 8.8*   < > 7.1* 10.6* 11.2* 9.5* 9.6*  HCT 42.0   < > 26.6*   < > 21.6* 31.5* 32.5* 27.8* 27.8*  MCV 94.2   < > 95.3  --  93.9  --  92.1 92.7 93.0  PLT 243   < > 148*  --  127*  --  147* 158 251   < > = values in this interval not displayed.   Cardiac Enzymes: No results for input(s): CKTOTAL, CKMB, CKMBINDEX, TROPONINI in the last 168 hours. BNP: Invalid input(s): POCBNP CBG: No results for input(s): GLUCAP in the last 168 hours. D-Dimer No results for input(s): DDIMER in the last 72 hours. Hgb A1c No results for input(s): HGBA1C in the last 72 hours. Lipid Profile No results for input(s): CHOL, HDL, LDLCALC, TRIG, CHOLHDL, LDLDIRECT in the last 72 hours. Thyroid function studies No results for input(s): TSH, T4TOTAL, T3FREE, THYROIDAB in the last 72 hours.  Invalid input(s): FREET3 Anemia work up No results for input(s): VITAMINB12, FOLATE, FERRITIN, TIBC, IRON, RETICCTPCT in the last 72 hours. Urinalysis    Component Value Date/Time   COLORURINE AMBER (A) 12/03/2019 2313   APPEARANCEUR HAZY (A) 12/03/2019 2313   LABSPEC 1.012 12/03/2019 2313   PHURINE 5.0 12/03/2019 2313   GLUCOSEU NEGATIVE 12/03/2019 2313   HGBUR MODERATE (A) 12/03/2019 2313   BILIRUBINUR NEGATIVE 12/03/2019 2313   KETONESUR NEGATIVE 12/03/2019 2313   PROTEINUR NEGATIVE  12/03/2019 2313   UROBILINOGEN 0.2 06/17/2012 1645   NITRITE NEGATIVE 12/03/2019 2313   LEUKOCYTESUR LARGE (A) 12/03/2019 2313   Sepsis Labs Invalid input(s): PROCALCITONIN,  WBC,  LACTICIDVEN Microbiology Recent Results (from the past 240 hour(s))  Urine culture     Status: None   Collection Time: 11/26/19 12:58 PM   Specimen: Urine, Catheterized  Result Value Ref Range Status   Specimen Description URINE, CATHETERIZED  Final   Special Requests NONE  Final   Culture   Final    NO GROWTH Performed at [facility], [address] [place], [place] [address]    Report  Status 11/27/2019 FINAL  Final  Respiratory Panel by RT PCR (Flu A&B, Covid) - Nasopharyngeal Swab     Status: None   Collection Time: 11/26/19  5:06 PM   Specimen: Nasopharyngeal Swab  Result Value Ref Range Status   SARS Coronavirus 2 by RT PCR NEGATIVE NEGATIVE Final    Comment: (NOTE) SARS-CoV-2 target nucleic acids are NOT DETECTED.  The SARS-CoV-2 RNA is generally detectable in upper respiratoy specimens during the acute phase of infection. The lowest concentration of SARS-CoV-2 viral copies this assay can detect is 131 copies/mL. A negative result does not preclude SARS-Cov-2 infection and should not be used as the sole basis for treatment or other Mackenzie management decisions. A negative result may occur with  improper specimen collection/handling, submission of specimen other than nasopharyngeal swab, presence of viral mutation(s) within the areas targeted by this assay, and inadequate number of viral copies (<131 copies/mL). A negative result must be combined with clinical observations, Mackenzie history, and epidemiological information. The expected result is Negative.  Fact Sheet for Patients:  https://www.moore.com/https://www.fda.gov/media/142436/download  Fact Sheet for Healthcare Providers:  https://www.young.biz/https://www.fda.gov/media/142435/download  This test is no t yet approved or cleared by the [place] FDA and  has  been authorized for detection and/or diagnosis of SARS-CoV-2 by FDA under an Emergency Use Authorization (EUA). This EUA will remain  in effect (meaning this test can be used) for the duration of the COVID-19 declaration under Section 564(b)(1) of the Act, 21 U.S.C. section 360bbb-3(b)(1), unless the authorization is terminated or revoked sooner.     Influenza A by PCR NEGATIVE NEGATIVE Final   Influenza B by PCR NEGATIVE NEGATIVE Final    Comment: (NOTE) The Xpert Xpress SARS-CoV-2/FLU/RSV assay is intended as an aid in  the diagnosis of influenza from Nasopharyngeal swab specimens and  should not be used as a sole basis for treatment. Nasal washings and  aspirates are unacceptable for Xpert Xpress SARS-CoV-2/FLU/RSV  testing.  Fact Sheet for Patients: https://www.moore.com/https://www.fda.gov/media/142436/download  Fact Sheet for Healthcare Providers: https://www.young.biz/https://www.fda.gov/media/142435/download  This test is not yet approved or cleared by the [place] FDA and  has been authorized for detection and/or diagnosis of SARS-CoV-2 by  FDA under an Emergency Use Authorization (EUA). This EUA will remain  in effect (meaning this test can be used) for the duration of the  Covid-19 declaration under Section 564(b)(1) of the Act, 21  U.S.C. section 360bbb-3(b)(1), unless the authorization is  terminated or revoked. Performed at [facility], [address] [place], [place] [address]   Respiratory Panel by RT PCR (Flu A&B, Covid) - Nasopharyngeal Swab     Status: None   Collection Time: 11/28/19  4:04 PM   Specimen: Nasopharyngeal Swab  Result Value Ref Range Status   SARS Coronavirus 2 by RT PCR NEGATIVE NEGATIVE Final    Comment: (NOTE) SARS-CoV-2 target nucleic acids are NOT DETECTED.  The SARS-CoV-2 RNA is generally detectable in upper respiratoy specimens during the acute phase of infection. The lowest concentration of SARS-CoV-2 viral copies this assay can detect is 131 copies/mL. A  negative result does not preclude SARS-Cov-2 infection and should not be used as the sole basis for treatment or other Mackenzie management decisions. A negative result may occur with  improper specimen collection/handling, submission of specimen other than nasopharyngeal swab, presence of viral mutation(s) within the areas targeted by this assay, and inadequate number of viral copies (<131 copies/mL). A negative result must be combined with clinical observations, Mackenzie history, and epidemiological information.  The expected result is Negative.  Fact Sheet for Patients:  https://www.moore.com/  Fact Sheet for Healthcare Providers:  https://www.young.biz/  This test is no t yet approved or cleared by the [place] FDA and  has been authorized for detection and/or diagnosis of SARS-CoV-2 by FDA under an Emergency Use Authorization (EUA). This EUA will remain  in effect (meaning this test can be used) for the duration of the COVID-19 declaration under Section 564(b)(1) of the Act, 21 U.S.C. section 360bbb-3(b)(1), unless the authorization is terminated or revoked sooner.     Influenza A by PCR NEGATIVE NEGATIVE Final   Influenza B by PCR NEGATIVE NEGATIVE Final    Comment: (NOTE) The Xpert Xpress SARS-CoV-2/FLU/RSV assay is intended as an aid in  the diagnosis of influenza from Nasopharyngeal swab specimens and  should not be used as a sole basis for treatment. Nasal washings and  aspirates are unacceptable for Xpert Xpress SARS-CoV-2/FLU/RSV  testing.  Fact Sheet for Patients: https://www.moore.com/  Fact Sheet for Healthcare Providers: https://www.young.biz/  This test is not yet approved or cleared by the [place] FDA and  has been authorized for detection and/or diagnosis of SARS-CoV-2 by  FDA under an Emergency Use Authorization (EUA). This EUA will remain  in effect (meaning this test can  be used) for the duration of the  Covid-19 declaration under Section 564(b)(1) of the Act, 21  U.S.C. section 360bbb-3(b)(1), unless the authorization is  terminated or revoked. Performed at [facility], [address] [place], [place] [address]   SARS Coronavirus 2 by RT PCR (hospital order, performed in [facility] hospital lab) Nasopharyngeal Nasopharyngeal Swab     Status: None   Collection Time: 12/02/19  2:30 PM   Specimen: Nasopharyngeal Swab  Result Value Ref Range Status   SARS Coronavirus 2 NEGATIVE NEGATIVE Final    Comment: (NOTE) SARS-CoV-2 target nucleic acids are NOT DETECTED.  The SARS-CoV-2 RNA is generally detectable in upper and lower respiratory specimens during the acute phase of infection. The lowest concentration of SARS-CoV-2 viral copies this assay can detect is 250 copies / mL. A negative result does not preclude SARS-CoV-2 infection and should not be used as the sole basis for treatment or other Mackenzie management decisions.  A negative result may occur with improper specimen collection / handling, submission of specimen other than nasopharyngeal swab, presence of viral mutation(s) within the areas targeted by this assay, and inadequate number of viral copies (<250 copies / mL). A negative result must be combined with clinical observations, Mackenzie history, and epidemiological information.  Fact Sheet for Patients:   BoilerBrush.com.cy  Fact Sheet for Healthcare Providers: https://pope.com/  This test is not yet approved or  cleared by the [place] FDA and has been authorized for detection and/or diagnosis of SARS-CoV-2 by FDA under an Emergency Use Authorization (EUA).  This EUA will remain in effect (meaning this test can be used) for the duration of the COVID-19 declaration under Section 564(b)(1) of the Act, 21 U.S.C. section 360bbb-3(b)(1), unless the authorization is terminated  or revoked sooner.  Performed at [facility], [address] [place], [place] [address]   SARS Coronavirus 2 by RT PCR (hospital order, performed in [facility] hospital lab) Nasopharyngeal Nasopharyngeal Swab     Status: None   Collection Time: 12/05/19  9:34 AM   Specimen: Nasopharyngeal Swab  Result Value Ref Range Status   SARS Coronavirus 2 NEGATIVE NEGATIVE Final    Comment: (NOTE) SARS-CoV-2 target  nucleic acids are NOT DETECTED.  The SARS-CoV-2 RNA is generally detectable in upper and lower respiratory specimens during the acute phase of infection. The lowest concentration of SARS-CoV-2 viral copies this assay can detect is 250 copies / mL. A negative result does not preclude SARS-CoV-2 infection and should not be used as the sole basis for treatment or other Mackenzie management decisions.  A negative result may occur with improper specimen collection / handling, submission of specimen other than nasopharyngeal swab, presence of viral mutation(s) within the areas targeted by this assay, and inadequate number of viral copies (<250 copies / mL). A negative result must be combined with clinical observations, Mackenzie history, and epidemiological information.  Fact Sheet for Patients:   BoilerBrush.com.cy  Fact Sheet for Healthcare Providers: https://pope.com/  This test is not yet approved or  cleared by the [place] FDA and has been authorized for detection and/or diagnosis of SARS-CoV-2 by FDA under an Emergency Use Authorization (EUA).  This EUA will remain in effect (meaning this test can be used) for the duration of the COVID-19 declaration under Section 564(b)(1) of the Act, 21 U.S.C. section 360bbb-3(b)(1), unless the authorization is terminated or revoked sooner.  Performed at [facility], [address] [place], [place] [address]      Time coordinating discharge: Over 30  minutes  SIGNED:   Marinda Elk, MD  [facility] 12/05/2019, 11:08 AM Pager   If 7PM-7AM, please contact night-coverage [email or web address] Password [email or web address]

## 2019-12-04 NOTE — Plan of Care (Signed)

## 2019-12-05 DIAGNOSIS — S72141A Displaced intertrochanteric fracture of right femur, initial encounter for closed fracture: Secondary | ICD-10-CM | POA: Diagnosis not present

## 2019-12-05 LAB — CREATININE, SERUM
Creatinine, Ser: 0.73 mg/dL (ref 0.44–1.00)
GFR, Estimated: >60 mL/min (ref >60)

## 2019-12-05 LAB — GLUCOSE, CAPILLARY: Glucose-Capillary: 102 mg/dL — ABNORMAL HIGH (ref 70–99)

## 2019-12-05 LAB — SARS CORONAVIRUS 2 BY RT PCR (HOSPITAL ORDER, PERFORMED IN ~~LOC~~ HOSPITAL LAB): SARS Coronavirus 2: NEGATIVE

## 2019-12-05 NOTE — Progress Notes (Addendum)
Pt remains A/Ox2. VSS. Complaints of pain and discomfort in R hip area from this morning are relieved. Discharge completed. IV removed and Telemetry removed. PTAR scheduled. Report callled to Lanora Manis, RN at facility.  Safety maintained. Bed lowered and locked, call light in reach and bed alarm activated. Will continue to monitor.

## 2019-12-05 NOTE — TOC Transition Note (Signed)
Transition of Care [facility]) - CM/SW Discharge Note   Patient Details  Name: Mackenzie [place] MRN: [id number] Date of Birth: 1924-08-03  Transition of Care [facility]) CM/SW Contact:  Carmina Miller, LCSWA Phone Number: 12/05/2019, 9:28 AM   Clinical Narrative:    Patient will DC to: [facility] Anticipated DC date: 12/05/19 Family notified: [name] Transport by: Sharin Mons   Per MD patient ready for DC to [facility]. RN to call report prior to discharge [phone]. RN, patient, patient's family, and facility notified of DC. Discharge Summary and FL2 sent to facility. DC packet on chart. Ambulance transport requested for patient.   CSW will sign off for now as social work intervention is no longer needed. Please consult [place] again if new needs arise.     Final next level of care: Skilled Nursing Facility Barriers to Discharge: Barriers Resolved   Patient Goals and CMS Choice Patient states their goals for this hospitalization and ongoing recovery are:: Get better soon CMS Medicare.gov Compare Post Acute Care list provided to:: Patient Represenative (must comment) (Son Sterling) Choice offered to / list presented to : Adult Children [facility])  Discharge Placement PASRR number recieved: 11/30/19            Patient chooses bed at: [facility] Patient to be transferred to facility by: PTAR Name of family member notified: Luis Patient and family notified of of transfer: 12/04/19  Discharge Plan and Services In-house Referral: Clinical Social Work   Post Acute Care Choice: Skilled Nursing Facility                               Social Determinants of Health (SDOH) Interventions     Readmission Risk Interventions No flowsheet data found.

## 2019-12-05 NOTE — Progress Notes (Signed)
[facility] PROGRESS NOTE    Progress Note  Mackenzie [place]  ZOX:[id number] DOB: 12-07-1924 DOA: 11/28/2019 PCP: Florentina Jennyripp, Henry, MD     Brief Narrative:   Mackenzie [place] is an 86 y.o. female mostly Spanish-speaking with a history of Alzheimer's hypothyroidism comes into the hospital for a fall and a right hip fracture at home.  Assessment/Plan:   Intertrochanteric fracture of right femur (HCC): Had to be kept overnight as she was nauseated she was given Zofran and improved she tolerated her dinner and breakfast this morning she is stable to be discharged to rehab.  New diarrhea: Hypokalemia: Alzheimer's dementia/sundowning: Mental status change: Hypothyroidism:  DVT prophylaxis: lovenox Family Communication:Son Status is: Inpatient  Remains inpatient appropriate because:Hemodynamically unstable   Dispo: The patient is from: Home              Anticipated d/c is to: SNF              Anticipated d/c date is: 1 day              Patient currently is not medically stable to d/c.        Code Status:     Code Status Orders  (From admission, onward)         Start     Ordered   11/28/19 1632  Full code  Continuous        11/28/19 1633        Code Status History    Date Active Date Inactive Code Status Order ID Comments User Context   11/26/2019 1811 11/27/2019 2132 Full Code [id number]  Gertha CalkinPatel, Ekta V, MD ED   01/05/2012 2033 01/08/2012 1927 Full Code [id number]  Dorothea OgleMyers, Iskra M, MD Inpatient   Advance Care Planning Activity        IV Access:    Peripheral IV   Procedures and diagnostic studies:   No results found.   Medical Consultants:    None.  Anti-Infectives:   none  Subjective:    Mackenzie [place] she relates some dysuria with urination.  Objective:    Vitals:   12/04/19 1235 12/04/19 2029 12/05/19 0329 12/05/19 0523  BP: (!) 139/53 (!) 120/52 (!) 110/56   Pulse: 89 82 93   Resp: 14 17 18    Temp: 98.5 F (36.9 C) 97.9 F (36.6  C) 98.4 F (36.9 C)   TempSrc: Oral Oral Oral   SpO2:  96% 98%   Weight:   48.1 kg 50.8 kg  Height:       SpO2: 98 % O2 Flow Rate (L/min): 2 L/min   Intake/Output Summary (Last 24 hours) at 12/05/2019 0915 Last data filed at 12/05/2019 0829 Gross per 24 hour  Intake 120 ml  Output 100 ml  Net 20 ml   Filed Weights   12/04/19 0000 12/05/19 0329 12/05/19 0523  Weight: 52.2 kg 48.1 kg 50.8 kg    Exam: General exam: In no acute distress. Respiratory system: Good air movement and clear to auscultation. Cardiovascular system: S1 & S2 heard, RRR.  Gastrointestinal system: Abdomen is nondistended, soft and suprapubic tenderness. Extremities: No pedal edema. Skin: No rashes, lesions or ulcers Psychiatry: Judgement and insight appear normal. Mood & affect appropriate.    Data Reviewed:    Labs: Basic Metabolic Panel: Recent Labs  Lab 11/28/19 1543 11/28/19 1543 11/30/19 [id number] 11/30/19 0906 12/01/19 0457 12/01/19 0457 12/01/19 2113 12/02/19 0355 12/05/19 0214  NA 142  --  138  --  141  --  140 139  --   K 3.7   < > 3.8   < > 2.6*   < > 4.0 3.7  --   CL 109  --  105  --  108  --  109 111  --   CO2 19*  --  28  --  25  --  25 24  --   GLUCOSE 118*  --  124*  --  121*  --  108* 98  --   BUN 20  --  19  --  23  --  23 21  --   CREATININE 0.96   < > 0.88  --  0.85  --  0.74 0.71 0.73  CALCIUM 8.5*  --  7.6*  --  7.9*  --  7.4* 7.3*  --   MG  --   --   --   --  1.9  --   --   --   --    < > = values in this interval not displayed.   GFR Estimated Creatinine Clearance: 31.6 mL/min (by C-G formula based on SCr of 0.73 mg/dL). Liver Function Tests: No results for input(s): AST, ALT, ALKPHOS, BILITOT, PROT, ALBUMIN in the last 168 hours. No results for input(s): LIPASE, AMYLASE in the last 168 hours. No results for input(s): AMMONIA in the last 168 hours. Coagulation profile Recent Labs  Lab 11/28/19 1543  INR 1.2   COVID-19 Labs  No results for input(s): DDIMER,  FERRITIN, LDH, CRP in the last 72 hours.  Lab Results  Component Value Date   SARSCOV2NAA NEGATIVE 12/02/2019   SARSCOV2NAA NEGATIVE 11/28/2019   SARSCOV2NAA NEGATIVE 11/26/2019    CBC: Recent Labs  Lab 11/28/19 1543 11/28/19 1543 11/29/19 1149 11/29/19 1149 11/30/19 0906 11/30/19 2128 12/01/19 0457 12/02/19 0355 12/04/19 0244  WBC 19.7*   < > 11.5*  --  10.2  --  12.4* 14.8* 12.8*  NEUTROABS 17.4*  --   --   --   --   --   --   --  10.2*  HGB 13.7   < > 8.8*   < > 7.1* 10.6* 11.2* 9.5* 9.6*  HCT 42.0   < > 26.6*   < > 21.6* 31.5* 32.5* 27.8* 27.8*  MCV 94.2   < > 95.3  --  93.9  --  92.1 92.7 93.0  PLT 243   < > 148*  --  127*  --  147* 158 251   < > = values in this interval not displayed.   Cardiac Enzymes: No results for input(s): CKTOTAL, CKMB, CKMBINDEX, TROPONINI in the last 168 hours. BNP (last 3 results) No results for input(s): PROBNP in the last 8760 hours. CBG: No results for input(s): GLUCAP in the last 168 hours. D-Dimer: No results for input(s): DDIMER in the last 72 hours. Hgb A1c: No results for input(s): HGBA1C in the last 72 hours. Lipid Profile: No results for input(s): CHOL, HDL, LDLCALC, TRIG, CHOLHDL, LDLDIRECT in the last 72 hours. Thyroid function studies: No results for input(s): TSH, T4TOTAL, T3FREE, THYROIDAB in the last 72 hours.  Invalid input(s): FREET3 Anemia work up: No results for input(s): VITAMINB12, FOLATE, FERRITIN, TIBC, IRON, RETICCTPCT in the last 72 hours. Sepsis Labs: Recent Labs  Lab 11/30/19 0906 12/01/19 0457 12/02/19 0355 12/04/19 0244  WBC 10.2 12.4* 14.8* 12.8*   Microbiology Recent Results (from the past 240 hour(s))  Urine culture     Status: None   Collection Time: 11/26/19  12:58 PM   Specimen: Urine, Catheterized  Result Value Ref Range Status   Specimen Description URINE, CATHETERIZED  Final   Special Requests NONE  Final   Culture   Final    NO GROWTH Performed at [facility], [address], [place], [place] [address]    Report Status 11/27/2019 FINAL  Final  Respiratory Panel by RT PCR (Flu A&B, Covid) - Nasopharyngeal Swab     Status: None   Collection Time: 11/26/19  5:06 PM   Specimen: Nasopharyngeal Swab  Result Value Ref Range Status   SARS Coronavirus 2 by RT PCR NEGATIVE NEGATIVE Final    Comment: (NOTE) SARS-CoV-2 target nucleic acids are NOT DETECTED.  The SARS-CoV-2 RNA is generally detectable in upper respiratoy specimens during the acute phase of infection. The lowest concentration of SARS-CoV-2 viral copies this assay can detect is 131 copies/mL. A negative result does not preclude SARS-Cov-2 infection and should not be used as the sole basis for treatment or other patient management decisions. A negative result may occur with  improper specimen collection/handling, submission of specimen other than nasopharyngeal swab, presence of viral mutation(s) within the areas targeted by this assay, and inadequate number of viral copies (<131 copies/mL). A negative result must be combined with clinical observations, patient history, and epidemiological information. The expected result is Negative.  Fact Sheet for Patients:  https://www.moore.com/  Fact Sheet for Healthcare Providers:  https://www.young.biz/  This test is no t yet approved or cleared by the [place] FDA and  has been authorized for detection and/or diagnosis of SARS-CoV-2 by FDA under an Emergency Use Authorization (EUA). This EUA will remain  in effect (meaning this test can be used) for the duration of the COVID-19 declaration under Section 564(b)(1) of the Act, 21 U.S.C. section 360bbb-3(b)(1), unless the authorization is terminated or revoked sooner.     Influenza A by PCR NEGATIVE NEGATIVE Final   Influenza B by PCR NEGATIVE NEGATIVE Final    Comment: (NOTE) The Xpert Xpress SARS-CoV-2/FLU/RSV assay is intended as an aid in  the diagnosis of  influenza from Nasopharyngeal swab specimens and  should not be used as a sole basis for treatment. Nasal washings and  aspirates are unacceptable for Xpert Xpress SARS-CoV-2/FLU/RSV  testing.  Fact Sheet for Patients: https://www.moore.com/  Fact Sheet for Healthcare Providers: https://www.young.biz/  This test is not yet approved or cleared by the [place] FDA and  has been authorized for detection and/or diagnosis of SARS-CoV-2 by  FDA under an Emergency Use Authorization (EUA). This EUA will remain  in effect (meaning this test can be used) for the duration of the  Covid-19 declaration under Section 564(b)(1) of the Act, 21  U.S.C. section 360bbb-3(b)(1), unless the authorization is  terminated or revoked. Performed at [facility], [address] [place], [place] [address]   Respiratory Panel by RT PCR (Flu A&B, Covid) - Nasopharyngeal Swab     Status: None   Collection Time: 11/28/19  4:04 PM   Specimen: Nasopharyngeal Swab  Result Value Ref Range Status   SARS Coronavirus 2 by RT PCR NEGATIVE NEGATIVE Final    Comment: (NOTE) SARS-CoV-2 target nucleic acids are NOT DETECTED.  The SARS-CoV-2 RNA is generally detectable in upper respiratoy specimens during the acute phase of infection. The lowest concentration of SARS-CoV-2 viral copies this assay can detect is 131 copies/mL. A negative result does not preclude SARS-Cov-2 infection and should not be used as the sole basis for  treatment or other patient management decisions. A negative result may occur with  improper specimen collection/handling, submission of specimen other than nasopharyngeal swab, presence of viral mutation(s) within the areas targeted by this assay, and inadequate number of viral copies (<131 copies/mL). A negative result must be combined with clinical observations, patient history, and epidemiological information. The expected result is  Negative.  Fact Sheet for Patients:  https://www.moore.com/  Fact Sheet for Healthcare Providers:  https://www.young.biz/  This test is no t yet approved or cleared by the [place] FDA and  has been authorized for detection and/or diagnosis of SARS-CoV-2 by FDA under an Emergency Use Authorization (EUA). This EUA will remain  in effect (meaning this test can be used) for the duration of the COVID-19 declaration under Section 564(b)(1) of the Act, 21 U.S.C. section 360bbb-3(b)(1), unless the authorization is terminated or revoked sooner.     Influenza A by PCR NEGATIVE NEGATIVE Final   Influenza B by PCR NEGATIVE NEGATIVE Final    Comment: (NOTE) The Xpert Xpress SARS-CoV-2/FLU/RSV assay is intended as an aid in  the diagnosis of influenza from Nasopharyngeal swab specimens and  should not be used as a sole basis for treatment. Nasal washings and  aspirates are unacceptable for Xpert Xpress SARS-CoV-2/FLU/RSV  testing.  Fact Sheet for Patients: https://www.moore.com/  Fact Sheet for Healthcare Providers: https://www.young.biz/  This test is not yet approved or cleared by the [place] FDA and  has been authorized for detection and/or diagnosis of SARS-CoV-2 by  FDA under an Emergency Use Authorization (EUA). This EUA will remain  in effect (meaning this test can be used) for the duration of the  Covid-19 declaration under Section 564(b)(1) of the Act, 21  U.S.C. section 360bbb-3(b)(1), unless the authorization is  terminated or revoked. Performed at [facility], [address], [place], [place] [address]   SARS Coronavirus 2 by RT PCR (hospital order, performed in [facility] hospital lab) Nasopharyngeal Nasopharyngeal Swab     Status: None   Collection Time: 12/02/19  2:30 PM   Specimen: Nasopharyngeal Swab  Result Value Ref Range Status   SARS Coronavirus 2 NEGATIVE NEGATIVE  Final    Comment: (NOTE) SARS-CoV-2 target nucleic acids are NOT DETECTED.  The SARS-CoV-2 RNA is generally detectable in upper and lower respiratory specimens during the acute phase of infection. The lowest concentration of SARS-CoV-2 viral copies this assay can detect is 250 copies / mL. A negative result does not preclude SARS-CoV-2 infection and should not be used as the sole basis for treatment or other patient management decisions.  A negative result may occur with improper specimen collection / handling, submission of specimen other than nasopharyngeal swab, presence of viral mutation(s) within the areas targeted by this assay, and inadequate number of viral copies (<250 copies / mL). A negative result must be combined with clinical observations, patient history, and epidemiological information.  Fact Sheet for Patients:   BoilerBrush.com.cy  Fact Sheet for Healthcare Providers: https://pope.com/  This test is not yet approved or  cleared by the [place] FDA and has been authorized for detection and/or diagnosis of SARS-CoV-2 by FDA under an Emergency Use Authorization (EUA).  This EUA will remain in effect (meaning this test can be used) for the duration of the COVID-19 declaration under Section 564(b)(1) of the Act, 21 U.S.C. section 360bbb-3(b)(1), unless the authorization is terminated or revoked sooner.  Performed at [facility], [address], [place], [place] [address]  Medications:   . ALPRAZolam  0.5 mg Oral Daily  . cephALEXin  500 mg Oral Q8H  . darifenacin  7.5 mg Oral Daily  . donepezil  10 mg Oral QHS  . enoxaparin (LOVENOX) injection  30 mg Subcutaneous Q24H  . levETIRAcetam  250 mg Oral BID  . levothyroxine  25 mcg Oral QAC breakfast  . loratadine  10 mg Oral Daily  . mouth rinse  15 mL Mouth Rinse BID  . memantine  10 mg Oral BID  . multivitamin with minerals  1 tablet Oral Q  breakfast  . pantoprazole  40 mg Oral Daily  . pravastatin  20 mg Oral QHS  . QUEtiapine  50 mg Oral BID  . sertraline  25 mg Oral QHS  . traZODone  50 mg Oral QHS   Continuous Infusions:    LOS: 7 days   Marinda Elk  [facility]  12/05/2019, 9:15 AM

## 2019-12-06 LAB — URINE CULTURE: Culture: >=100000 — AB

## 2019-12-23 ENCOUNTER — Other Ambulatory Visit (HOSPITAL_COMMUNITY): Payer: Self-pay | Admitting: Orthopedic Surgery

## 2019-12-23 DIAGNOSIS — M79604 Pain in right leg: Secondary | ICD-10-CM

## 2019-12-23 DIAGNOSIS — Z9889 Other specified postprocedural states: Secondary | ICD-10-CM

## 2019-12-26 ENCOUNTER — Ambulatory Visit (HOSPITAL_COMMUNITY): Payer: Medicare Other

## 2021-08-06 ENCOUNTER — Inpatient Hospital Stay (HOSPITAL_COMMUNITY): Payer: Medicare Other

## 2021-08-06 ENCOUNTER — Inpatient Hospital Stay (HOSPITAL_COMMUNITY)
Admission: EM | Admit: 2021-08-06 | Discharge: 2021-08-09 | DRG: 522 | Disposition: A | Discharge status: Skilled Nursing Facility | Payer: Medicare Other | Attending: Internal Medicine | Admitting: Internal Medicine

## 2021-08-06 ENCOUNTER — Emergency Department (HOSPITAL_COMMUNITY): Payer: Medicare Other

## 2021-08-06 ENCOUNTER — Encounter (HOSPITAL_COMMUNITY): Payer: Self-pay

## 2021-08-06 ENCOUNTER — Other Ambulatory Visit: Payer: Self-pay

## 2021-08-06 DIAGNOSIS — F0284 Dementia in other diseases classified elsewhere, unspecified severity, with anxiety: Secondary | ICD-10-CM | POA: Diagnosis present

## 2021-08-06 DIAGNOSIS — D72829 Elevated white blood cell count, unspecified: Secondary | ICD-10-CM | POA: Diagnosis not present

## 2021-08-06 DIAGNOSIS — Z8781 Personal history of (healed) traumatic fracture: Secondary | ICD-10-CM

## 2021-08-06 DIAGNOSIS — W1830XA Fall on same level, unspecified, initial encounter: Secondary | ICD-10-CM | POA: Diagnosis present

## 2021-08-06 DIAGNOSIS — Y92009 Unspecified place in unspecified non-institutional (private) residence as the place of occurrence of the external cause: Secondary | ICD-10-CM

## 2021-08-06 DIAGNOSIS — F32A Depression, unspecified: Secondary | ICD-10-CM | POA: Diagnosis present

## 2021-08-06 DIAGNOSIS — F0283 Dementia in other diseases classified elsewhere, unspecified severity, with mood disturbance: Secondary | ICD-10-CM | POA: Diagnosis present

## 2021-08-06 DIAGNOSIS — J9611 Chronic respiratory failure with hypoxia: Secondary | ICD-10-CM | POA: Diagnosis present

## 2021-08-06 DIAGNOSIS — F0390 Unspecified dementia without behavioral disturbance: Secondary | ICD-10-CM | POA: Diagnosis present

## 2021-08-06 DIAGNOSIS — Z9981 Dependence on supplemental oxygen: Secondary | ICD-10-CM | POA: Diagnosis not present

## 2021-08-06 DIAGNOSIS — F419 Anxiety disorder, unspecified: Secondary | ICD-10-CM | POA: Diagnosis present

## 2021-08-06 DIAGNOSIS — E860 Dehydration: Secondary | ICD-10-CM | POA: Diagnosis present

## 2021-08-06 DIAGNOSIS — E039 Hypothyroidism, unspecified: Secondary | ICD-10-CM | POA: Diagnosis present

## 2021-08-06 DIAGNOSIS — F03B0 Unspecified dementia, moderate, without behavioral disturbance, psychotic disturbance, mood disturbance, and anxiety: Secondary | ICD-10-CM | POA: Diagnosis not present

## 2021-08-06 DIAGNOSIS — K219 Gastro-esophageal reflux disease without esophagitis: Secondary | ICD-10-CM | POA: Diagnosis present

## 2021-08-06 DIAGNOSIS — J441 Chronic obstructive pulmonary disease with (acute) exacerbation: Secondary | ICD-10-CM | POA: Diagnosis present

## 2021-08-06 DIAGNOSIS — S72002A Fracture of unspecified part of neck of left femur, initial encounter for closed fracture: Secondary | ICD-10-CM | POA: Diagnosis present

## 2021-08-06 DIAGNOSIS — Z7982 Long term (current) use of aspirin: Secondary | ICD-10-CM | POA: Diagnosis not present

## 2021-08-06 DIAGNOSIS — E785 Hyperlipidemia, unspecified: Secondary | ICD-10-CM | POA: Diagnosis present

## 2021-08-06 DIAGNOSIS — J449 Chronic obstructive pulmonary disease, unspecified: Secondary | ICD-10-CM | POA: Diagnosis present

## 2021-08-06 DIAGNOSIS — I10 Essential (primary) hypertension: Secondary | ICD-10-CM | POA: Diagnosis present

## 2021-08-06 DIAGNOSIS — Z79899 Other long term (current) drug therapy: Secondary | ICD-10-CM

## 2021-08-06 DIAGNOSIS — Z79890 Hormone replacement therapy (postmenopausal): Secondary | ICD-10-CM

## 2021-08-06 DIAGNOSIS — G309 Alzheimer's disease, unspecified: Secondary | ICD-10-CM | POA: Diagnosis present

## 2021-08-06 DIAGNOSIS — S72012A Unspecified intracapsular fracture of left femur, initial encounter for closed fracture: Secondary | ICD-10-CM | POA: Diagnosis present

## 2021-08-06 DIAGNOSIS — M21152 Varus deformity, not elsewhere classified, left hip: Secondary | ICD-10-CM | POA: Diagnosis present

## 2021-08-06 DIAGNOSIS — W19XXXA Unspecified fall, initial encounter: Secondary | ICD-10-CM

## 2021-08-06 DIAGNOSIS — D62 Acute posthemorrhagic anemia: Secondary | ICD-10-CM | POA: Diagnosis not present

## 2021-08-06 DIAGNOSIS — I739 Peripheral vascular disease, unspecified: Secondary | ICD-10-CM | POA: Diagnosis present

## 2021-08-06 LAB — CBC
HCT: 48.3 % — ABNORMAL HIGH (ref 36.0–46.0)
Hemoglobin: 16 g/dL — ABNORMAL HIGH (ref 12.0–15.0)
MCH: 31.6 pg (ref 26.0–34.0)
MCHC: 33.1 g/dL (ref 30.0–36.0)
MCV: 95.5 fL (ref 80.0–100.0)
Platelets: 190 10*3/uL (ref 150–400)
RBC: 5.06 MIL/uL (ref 3.87–5.11)
RDW: 13.4 % (ref 11.5–15.5)
WBC: 11.6 10*3/uL — ABNORMAL HIGH (ref 4.0–10.5)
nRBC: 0 % (ref 0.0–0.2)

## 2021-08-06 LAB — BASIC METABOLIC PANEL
Anion gap: 7 (ref 5–15)
BUN: 27 mg/dL — ABNORMAL HIGH (ref 8–23)
CO2: 27 mmol/L (ref 22–32)
Calcium: 8.9 mg/dL (ref 8.9–10.3)
Chloride: 104 mmol/L (ref 98–111)
Creatinine, Ser: 0.73 mg/dL (ref 0.44–1.00)
GFR, Estimated: >60 mL/min (ref >60)
Glucose, Bld: 127 mg/dL — ABNORMAL HIGH (ref 70–99)
Potassium: 3.8 mmol/L (ref 3.5–5.1)
Sodium: 138 mmol/L (ref 135–145)

## 2021-08-06 MED ORDER — FENTANYL CITRATE PF 50 MCG/ML IJ SOSY
50.0000 ug | PREFILLED_SYRINGE | Freq: Once | INTRAMUSCULAR | Status: AC
  Administered 2021-08-06: 50 ug via INTRAVENOUS
  Filled 2021-08-06: qty 1

## 2021-08-06 MED ORDER — MORPHINE SULFATE (PF) 2 MG/ML IV SOLN
0.5000 mg | INTRAVENOUS | Status: DC | PRN
  Filled 2021-08-06: qty 1

## 2021-08-06 MED ORDER — SODIUM CHLORIDE 0.9 % IV SOLN: INTRAVENOUS | Status: DC

## 2021-08-06 MED ORDER — HYDROCODONE-ACETAMINOPHEN 5-325 MG PO TABS: 1.0000 | ORAL_TABLET | Freq: Four times a day (QID) | ORAL | Status: DC | PRN

## 2021-08-06 MED ORDER — MORPHINE SULFATE (PF) 2 MG/ML IV SOLN
2.0000 mg | Freq: Once | INTRAVENOUS | Status: AC
  Administered 2021-08-06: 2 mg via INTRAVENOUS
  Filled 2021-08-06: qty 1

## 2021-08-06 NOTE — ED Provider Notes (Signed)
[facility]-EMERGENCY DEPT Provider Note   CSN: [id number] Arrival date & time: 08/06/21  1205     History {Add pertinent medical, surgical, social history, OB history to HPI:1} Chief Complaint  Patient presents with   Mackenzie Peterson is a 88 y.o. female.  Patient with history of dementia and right hip fracture presents today with complaints of fall and left hip pain. Patient with history of dementia, therefore most of history provided by the patients daughter in law who I spoke with over the phone. States that around 6:45 pm last night, patient fell in her home. States that she is not supposed to ambulate without assistance, however given her dementia she often forgets. States that initially the patient refused to go to the hospital, however was unable to walk and therefore presents here for evaluation. Patient lives at home with family and is spanish speaking only.  Of note, patient had right hip fracture in 2021 with repair by Dr. Aundria Rud at [facility]  Level 5 caveat --- dementia  The history is provided by the patient. A language interpreter was used.  Fall       Home Medications Prior to Admission medications   Medication Sig Start Date End Date Taking? Authorizing Provider  acetaminophen (TYLENOL) 500 MG tablet Take 500-1,000 mg by mouth every 6 (six) hours as needed for mild pain or headache.    [provider]  albuterol (PROVENTIL) (2.5 MG/3ML) 0.083% nebulizer solution Take 2.5 mg by nebulization 3 (three) times daily as needed for wheezing or shortness of breath.     [provider]  ALPRAZolam Prudy Feeler) 0.5 MG tablet Take 1 tablet (0.5 mg total) by mouth 3 (three) times daily as needed for anxiety. 12/04/19   Marinda Elk, MD  aspirin 81 MG tablet Take 81 mg by mouth daily.    [provider]  cetirizine (ZYRTEC) 10 MG tablet Take 10 mg by mouth at bedtime.    [provider]  donepezil (ARICEPT) 10  MG tablet Take 10 mg by mouth at bedtime. 09/10/19   [provider]  enoxaparin (LOVENOX) 30 MG/0.3ML injection Inject 0.3 mLs (30 mg total) into the skin daily. 12/04/19 01/03/20  Marinda Elk, MD  levETIRAcetam (KEPPRA) 250 MG tablet Take 1 tablet (250 mg total) by mouth 2 (two) times daily. 12/04/19   Marinda Elk, MD  levothyroxine (SYNTHROID, LEVOTHROID) 25 MCG tablet Take 25 mcg by mouth daily before breakfast.     [provider]  memantine (NAMENDA) 10 MG tablet Take 10 mg by mouth 2 (two) times daily.    [provider]  Multiple Vitamins-Minerals (ONE-A-DAY PROACTIVE 65+) TABS Take 1 tablet by mouth daily with breakfast.    [provider]  nitroGLYCERIN (NITROSTAT) 0.4 MG SL tablet Place 0.4 mg under the tongue every 5 (five) minutes as needed for chest pain.     [provider]  omeprazole (PRILOSEC) 20 MG capsule TAKE 1 CAPSULE BY MOUTH 30 TO 60 MINUTES BEFORE THE FIRST AND LAST MEALS OF THE DAY Patient taking differently: Take 20 mg by mouth 2 (two) times daily before a meal.     Kalman Shan, MD  OXYGEN Inhale 2 L/min into the lungs at bedtime.    [provider]  pravastatin (PRAVACHOL) 20 MG tablet Take 20 mg by mouth at bedtime. 04/17/19   [provider]  QUEtiapine (SEROQUEL) 50 MG tablet Take 50 mg by mouth 2 (two) times daily.  [provider]  sertraline (ZOLOFT) 25 MG tablet Take 25 mg by mouth at bedtime. 04/20/19   [provider]  solifenacin (VESICARE) 5 MG tablet Take 5 mg by mouth at bedtime.     [provider]  traMADol (ULTRAM) 50 MG tablet Take 1 tablet (50 mg total) by mouth every 6 (six) hours as needed for up to 25 doses for moderate pain. 12/04/19   Marinda Elk, MD  traZODone (DESYREL) 50 MG tablet Take 50 mg by mouth at bedtime.    [provider]      Allergies    Patient has no known allergies.    Review of Systems   Review of  Systems  Unable to perform ROS: Dementia  Musculoskeletal:  Positive for arthralgias.  All other systems reviewed and are negative.   Physical Exam Updated Vital Signs BP (!) 130/55   Pulse 72   Temp 97.6 F (36.4 C)   Resp 14   SpO2 93%  Physical Exam Vitals and nursing note reviewed.  Constitutional:      General: She is not in acute distress.    Appearance: Normal appearance. She is normal weight. She is not ill-appearing, toxic-appearing or diaphoretic.  HENT:     Head: Normocephalic and atraumatic.  Cardiovascular:     Rate and Rhythm: Normal rate.  Pulmonary:     Effort: Pulmonary effort is normal. No respiratory distress.  Musculoskeletal:        General: Normal range of motion.     Cervical back: Normal range of motion.     Comments: Left hip with palpable tenderness and obvious deformity with shortening and external rotation. DP and PT pulses intact and 2+  Skin:    General: Skin is warm and dry.  Neurological:     General: No focal deficit present.     Mental Status: She is alert. Mental status is at baseline.  Psychiatric:        Mood and Affect: Mood normal.        Behavior: Behavior normal.     ED Results / Procedures / Treatments   Labs (all labs ordered are listed, but only abnormal results are displayed) Labs Reviewed  CBC - Abnormal; Notable for the following components:      Result Value   WBC 11.6 (*)    Hemoglobin 16.0 (*)    HCT 48.3 (*)    All other components within normal limits  BASIC METABOLIC PANEL - Abnormal; Notable for the following components:   Glucose, Bld 127 (*)    BUN 27 (*)    All other components within normal limits    EKG None  Radiology DG Hip Unilat With Pelvis 2-3 Views Left  Result Date: 08/06/2021 CLINICAL DATA:  Fall.  Left hip pain. EXAM: DG HIP (WITH OR WITHOUT PELVIS) 2-3V LEFT COMPARISON:  None Available. FINDINGS: Bones are diffusely demineralized. Status post ORIF for proximal right femur fracture. Right  femoral intramedullary nail has been incompletely visualized. AP view of the left hip shows a transcervical femoral neck fracture with some cranial over riding of the distal fragment and varus angulation. This is largely obscured on the lateral projections. IMPRESSION: 1. Transcervical left femoral neck fracture with varus angulation. 2. Status post ORIF for proximal right femur fracture. 3. Diffuse bony demineralization. Electronically Signed   By: Kennith Center M.D.   On: 08/06/2021 13:55    Procedures Procedures  {Document cardiac monitor, telemetry assessment procedure when appropriate:1}  Medications Ordered in ED Medications  morphine (PF) 2 MG/ML injection 2 mg (2 mg Intravenous Given 08/06/21 1434)    ED Course/ Medical Decision Making/ A&P                           Medical Decision Making Amount and/or Complexity of Data Reviewed Labs: ordered. Radiology: ordered.  Risk Prescription drug management.   This patient presents to the ED for concern of fall, this involves an extensive number of treatment options, and is a complaint that carries with it a high risk of complications and morbidity.   Co morbidities that complicate the patient evaluation  Hx dementia   Additional history obtained:  Additional history obtained from previous ortho notes when patient had right hip repair through [facility] Also discussed patient with her daughter in law who provided history   Lab Tests:  I Ordered, and personally interpreted labs.  The pertinent results include:  WBC 11.6 likely reactive. No other acute laboratory findings   Imaging Studies ordered:  I ordered imaging studies including left hip x-ray  I independently visualized and interpreted imaging which showed  1. Transcervical left femoral neck fracture with varus angulation. 2. Status post ORIF for proximal right femur fracture. I agree with the radiologist interpretation   Consultations Obtained:  I requested  consultation with the [facility] on call Dr. Linna Caprice,  and discussed lab and imaging findings as well as pertinent plan - they recommend: admit to medicine, they will see the patient   Problem List / ED Course / Critical interventions / Medication management  I ordered medication including fentanyl and morphine  for pain  Reevaluation of the patient after these medicines showed that the patient improved I have reviewed the patients home medicines and have made adjustments as needed   Test / Admission - Considered:  Patient presents today with fall, left hip fracture. Talked to Dr. Linna Caprice who is going to see the patient, likely surgery either tonight or in the morning. NPO for now. Discussed with patients daughter in law who is understanding and amenable with plan.  Discussed patient with hospitalist who agrees to admit.   Final Clinical Impression(s) / ED Diagnoses Final diagnoses:  Closed fracture of left hip, initial encounter [facility])    Rx / DC Orders ED Discharge Orders     None

## 2021-08-06 NOTE — ED Triage Notes (Signed)
Pt coming form home with c/o fall last night. Pt fell around 1900 last night but was stubborn about coming to hospital per family. Pt had a witnessed fall onto her left side. Pt is currently c/o left hip pain. Per EMS they noticed shortening of her left leg. Pt has hx of fracture to right hip. No LOC, denies hitting head, no blood thinners, no crepitus noted.   Hx dementia. Pt is poor historian.

## 2021-08-06 NOTE — H&P (Signed)
History and Physical    Patient: Mackenzie Peterson KCL:[id number] DOB: [birth year] DOA: 08/06/2021 DOS: the patient was seen and examined on 08/06/2021 PCP: [name], MD  Patient coming from: Home  Chief Complaint:  Chief Complaint  Patient presents with   Fall   HPI: Mackenzie Peterson is a 88 y.o. female with medical history significant of COPD, chronic hypoxic respiratory failure on 2L [place], dementia, HTN, hypothyroidism, HLD. Presenting with hip pain after a fall. History is from daughter as the patient is demented. She reports that the patient was trying to move from a counter in her kitchen to a table last night. While she was moving, her legs gave way and she fell on her left side. She did not hit her head. She did not suffer LOC. There was no chest pain, palpitations, lightheadedness, or dizziness prior to her fall. Family assisted her up, and she said she felt fine. However, this morning when she woke, she was in significant pain. She was unable to ambulate. Family became concerned and brought her to the hospital.    Review of Systems: unable to review all systems due to the inability of the patient to answer questions. Past Medical History:  Diagnosis Date   Alzheimer disease (HCC)    Asthma    Chronic bronchitis (HCC)    Dysthymic disorder    Emphysema    Hyperlipidemia    Hypertension    hypothyroid    PVD (peripheral vascular disease) (HCC)    Past Surgical History:  Procedure Laterality Date   INTRAMEDULLARY (IM) NAIL INTERTROCHANTERIC Right 11/28/2019   Procedure: INTRAMEDULLARY (IM) NAIL INTERTROCHANTRIC;  Surgeon: Yolonda Kida, MD;  Location: [facility] OR;  Service: Orthopedics;  Laterality: Right;   Social History:  reports that she has never smoked. She has never used smokeless tobacco. She reports that she does not drink alcohol and does not use drugs.  No Known Allergies  History reviewed. No pertinent family history.  Prior to Admission medications   Medication  Sig Start Date End Date Taking? Authorizing Provider  acetaminophen (TYLENOL) 500 MG tablet Take 500-1,000 mg by mouth every 6 (six) hours as needed for mild pain or headache.    [provider]  albuterol (PROVENTIL) (2.5 MG/3ML) 0.083% nebulizer solution Take 2.5 mg by nebulization 3 (three) times daily as needed for wheezing or shortness of breath.     [provider]  ALPRAZolam Prudy Feeler) 0.5 MG tablet Take 1 tablet (0.5 mg total) by mouth 3 (three) times daily as needed for anxiety. 12/04/19   Marinda Elk, MD  aspirin 81 MG tablet Take 81 mg by mouth daily.    [provider]  cetirizine (ZYRTEC) 10 MG tablet Take 10 mg by mouth at bedtime.    [provider]  donepezil (ARICEPT) 10 MG tablet Take 10 mg by mouth at bedtime. 09/10/19   [provider]  enoxaparin (LOVENOX) 30 MG/0.3ML injection Inject 0.3 mLs (30 mg total) into the skin daily. 12/04/19 01/03/20  Marinda Elk, MD  levETIRAcetam (KEPPRA) 250 MG tablet Take 1 tablet (250 mg total) by mouth 2 (two) times daily. 12/04/19   Marinda Elk, MD  levothyroxine (SYNTHROID, LEVOTHROID) 25 MCG tablet Take 25 mcg by mouth daily before breakfast.     [provider]  memantine (NAMENDA) 10 MG tablet Take 10 mg by mouth 2 (two) times daily.    [provider]  Multiple Vitamins-Minerals (ONE-A-DAY PROACTIVE 65+) TABS Take 1 tablet by mouth daily  with breakfast.    [provider]  nitroGLYCERIN (NITROSTAT) 0.4 MG SL tablet Place 0.4 mg under the tongue every 5 (five) minutes as needed for chest pain.     [provider]  omeprazole (PRILOSEC) 20 MG capsule TAKE 1 CAPSULE BY MOUTH 30 TO 60 MINUTES BEFORE THE FIRST AND LAST MEALS OF THE DAY Patient taking differently: Take 20 mg by mouth 2 (two) times daily before a meal.     Kalman Shan, MD  OXYGEN Inhale 2 L/min into the lungs at bedtime.    [provider]  pravastatin (PRAVACHOL) 20  MG tablet Take 20 mg by mouth at bedtime. 04/17/19   [provider]  QUEtiapine (SEROQUEL) 50 MG tablet Take 50 mg by mouth 2 (two) times daily.    [provider]  sertraline (ZOLOFT) 25 MG tablet Take 25 mg by mouth at bedtime. 04/20/19   [provider]  solifenacin (VESICARE) 5 MG tablet Take 5 mg by mouth at bedtime.     [provider]  traMADol (ULTRAM) 50 MG tablet Take 1 tablet (50 mg total) by mouth every 6 (six) hours as needed for up to 25 doses for moderate pain. 12/04/19   Marinda Elk, MD  traZODone (DESYREL) 50 MG tablet Take 50 mg by mouth at bedtime.    [provider]    Physical Exam: Vitals:   08/06/21 1235 08/06/21 1445 08/06/21 1530  BP: 136/78 (!) 130/55 (!) 156/70  Pulse: 75 72 82  Resp: 16 14 16   Temp: 97.6 F (36.4 C)    SpO2: 100% 93% 93%   General: 88 y.o. female resting in bed in NAD Eyes: PERRL, normal sclera ENMT: Nares patent w/o discharge, orophaynx clear, dentition normal, ears w/o discharge/lesions/ulcers Neck: Supple, trachea midline Cardiovascular: RRR, +S1, S2, no m/g/r, equal pulses throughout Respiratory: CTABL, no w/r/r, normal WOB GI: BS+, NDNT, no masses noted, no organomegaly noted MSK: No e/c/c; limited ROM in left hip d/t pain Neuro: confused, not following instructions Psyc: demented, but calm  Data Reviewed:  Na+  138 K+  3.8 CO2  27 BUN  27 SCr 0.73  WBC 11.6 Hgb 16.0 Plt  190  XR Left Hip 1. Transcervical left femoral neck fracture with varus angulation. 2. Status post ORIF for proximal right femur fracture. 3. Diffuse bony demineralization.  Assessment and Plan: Fall  Left hip fracture     - admit to inpt, med-surg     - [facility] consulted, appreciate assistance     - hip fracture protocol     - PT/OT consults after procedure     - TOC consult     - pain control     - NPOpMN  Dementia     - resume home regimen when confirmed  Hypothyroidism     -  resume home regimen when confirmed  HLD     - resume home regimen when confirmed  Leukocytosis     - likely reactive, no fever, follow  Dehydration     - looks a little dehydrated and BUN:Scr ratio is up; add fluids  COPD w/ chronic hypoxic respiratory failure on 2L [place] qHS    - continue home regimen when confirmed  Anxiety Depression     - continue home regimen when confirmed  Advance Care Planning:   Code Status: FULL  Consults: Orthopedics ([facility])  Family Communication: Spoke with daughter in law by phone.   Severity of Illness: The appropriate patient status  for this patient is INPATIENT. Inpatient status is judged to be reasonable and necessary in order to provide the required intensity of service to ensure the patient's safety. The patient's presenting symptoms, physical exam findings, and initial radiographic and laboratory data in the context of their chronic comorbidities is felt to place them at high risk for further clinical deterioration. Furthermore, it is not anticipated that the patient will be medically stable for discharge from the hospital within 2 midnights of admission.   * I certify that at the point of admission it is my clinical judgment that the patient will require inpatient hospital care spanning beyond 2 midnights from the point of admission due to high intensity of service, high risk for further deterioration and high frequency of surveillance required.*  Author: Teddy Spike, DO 08/06/2021 5:40 PM  For on call review [email or web address].

## 2021-08-06 NOTE — Progress Notes (Signed)
Consult received for displaced left femoral neck fx. Fracture requires hemiarthroplasty for pain control and immediate mobilization OOB. Plan for surgery tomorrow am. Full consult in am. NPO after MN. Hold chemical DVT ppx.

## 2021-08-06 NOTE — Anesthesia Preprocedure Evaluation (Signed)
Anesthesia Evaluation  Patient identified by MRN, date of birth, ID band Patient awake    Reviewed: Allergy & Precautions, NPO status , Patient's Chart, lab work & pertinent test results, reviewed documented beta blocker date and time   Airway Mallampati: I  TM Distance: >3 FB Neck ROM: Full    Dental no notable dental hx. (+) Dental Advisory Given   Pulmonary shortness of breath, with exertion, at rest, lying and Long-Term Oxygen Therapy, asthma , pneumonia, resolved, COPD,  COPD inhaler and oxygen dependent,    Pulmonary exam normal breath sounds clear to auscultation       Cardiovascular hypertension, Pt. on medications + Peripheral Vascular Disease   Rhythm:Regular Rate:Normal  Echo 11/27/19 1. Left ventricular ejection fraction, by estimation, is 65 to 70%. The left ventricle has hyperdynamic function. The left ventricle has no regional wall motion abnormalities. There is mild left ventricular hypertrophy. Left ventricular diastolic parameters are consistent with Grade I diastolic dysfunction (impaired relaxation).  2. Right ventricular systolic function is normal. The right ventricular size is normal. The estimated right ventricular systolic pressure is 21.7 mmHg.  3. Left atrial size was severely dilated.  4. The mitral valve is degenerative. Mild mitral valve regurgitation. No evidence of mitral stenosis. Moderate mitral annular calcification.  5. The aortic valve is tricuspid. Aortic valve regurgitation is trivial. No aortic stenosis is present.   EKG 08/07/19 NSR, Incomplete RBBB, LAFB, LVH 6. The inferior vena cava is normal in size with greater than 50% respiratory variability, suggesting right atrial pressure of 3 mmHg.    Neuro/Psych PSYCHIATRIC DISORDERS Anxiety Depression Dementia negative neurological ROS     GI/Hepatic Neg liver ROS, GERD  Medicated,  Endo/Other  Hypothyroidism   Renal/GU Probable  dehydration- elevated BUN and H/H  negative genitourinary   Musculoskeletal Left femoral neck Fx Hx/o IM nail left femur 2021   Abdominal   Peds  Hematology negative hematology ROS (+)   Anesthesia Other Findings   Reproductive/Obstetrics                            Anesthesia Physical Anesthesia Plan  ASA: 3 and emergent  Anesthesia Plan: Spinal   Post-op Pain Management: Regional block*, Minimal or no pain anticipated and Dilaudid IV   Induction: Intravenous  PONV Risk Score and Plan: Propofol infusion, Treatment may vary due to age or medical condition and Ondansetron  Airway Management Planned: Natural Airway  Additional Equipment: None  Intra-op Plan:   Post-operative Plan:   Informed Consent: I have reviewed the patients History and Physical, chart, labs and discussed the procedure including the risks, benefits and alternatives for the proposed anesthesia with the patient or authorized representative who has indicated his/her understanding and acceptance.     Dental advisory given, Consent reviewed with POA and Interpreter used for interveiw  Plan Discussed with: CRNA and Anesthesiologist  Anesthesia Plan Comments:        Anesthesia Quick Evaluation

## 2021-08-07 ENCOUNTER — Inpatient Hospital Stay (HOSPITAL_COMMUNITY): Payer: Medicare Other | Admitting: Anesthesiology

## 2021-08-07 ENCOUNTER — Inpatient Hospital Stay (HOSPITAL_COMMUNITY): Payer: Medicare Other

## 2021-08-07 ENCOUNTER — Other Ambulatory Visit: Payer: Self-pay

## 2021-08-07 ENCOUNTER — Encounter (HOSPITAL_COMMUNITY)
Admission: EM | Disposition: A | Discharge status: Skilled Nursing Facility | Payer: Self-pay | Source: Home / Self Care | Attending: Internal Medicine

## 2021-08-07 DIAGNOSIS — E785 Hyperlipidemia, unspecified: Secondary | ICD-10-CM

## 2021-08-07 DIAGNOSIS — E039 Hypothyroidism, unspecified: Secondary | ICD-10-CM

## 2021-08-07 DIAGNOSIS — F03B0 Unspecified dementia, moderate, without behavioral disturbance, psychotic disturbance, mood disturbance, and anxiety: Secondary | ICD-10-CM

## 2021-08-07 DIAGNOSIS — I10 Essential (primary) hypertension: Secondary | ICD-10-CM

## 2021-08-07 DIAGNOSIS — F32A Depression, unspecified: Secondary | ICD-10-CM

## 2021-08-07 DIAGNOSIS — F419 Anxiety disorder, unspecified: Secondary | ICD-10-CM

## 2021-08-07 DIAGNOSIS — J449 Chronic obstructive pulmonary disease, unspecified: Secondary | ICD-10-CM

## 2021-08-07 DIAGNOSIS — S72002A Fracture of unspecified part of neck of left femur, initial encounter for closed fracture: Secondary | ICD-10-CM

## 2021-08-07 DIAGNOSIS — W19XXXA Unspecified fall, initial encounter: Secondary | ICD-10-CM

## 2021-08-07 DIAGNOSIS — J9611 Chronic respiratory failure with hypoxia: Secondary | ICD-10-CM | POA: Diagnosis not present

## 2021-08-07 DIAGNOSIS — D72829 Elevated white blood cell count, unspecified: Secondary | ICD-10-CM

## 2021-08-07 DIAGNOSIS — Y92009 Unspecified place in unspecified non-institutional (private) residence as the place of occurrence of the external cause: Secondary | ICD-10-CM

## 2021-08-07 HISTORY — PX: ANTERIOR APPROACH HEMI HIP ARTHROPLASTY: SHX6690

## 2021-08-07 LAB — BASIC METABOLIC PANEL
Anion gap: 7 (ref 5–15)
BUN: 20 mg/dL (ref 8–23)
CO2: 24 mmol/L (ref 22–32)
Calcium: 8.2 mg/dL — ABNORMAL LOW (ref 8.9–10.3)
Chloride: 107 mmol/L (ref 98–111)
Creatinine, Ser: 0.56 mg/dL (ref 0.44–1.00)
GFR, Estimated: >60 mL/min (ref >60)
Glucose, Bld: 117 mg/dL — ABNORMAL HIGH (ref 70–99)
Potassium: 4.1 mmol/L (ref 3.5–5.1)
Sodium: 138 mmol/L (ref 135–145)

## 2021-08-07 LAB — TYPE AND SCREEN
ABO/RH(D): B POS
Antibody Screen: NEGATIVE

## 2021-08-07 LAB — CBC
HCT: 45.8 % (ref 36.0–46.0)
Hemoglobin: 15 g/dL (ref 12.0–15.0)
MCH: 31.5 pg (ref 26.0–34.0)
MCHC: 32.8 g/dL (ref 30.0–36.0)
MCV: 96.2 fL (ref 80.0–100.0)
Platelets: 157 10*3/uL (ref 150–400)
RBC: 4.76 MIL/uL (ref 3.87–5.11)
RDW: 13.2 % (ref 11.5–15.5)
WBC: 10.7 10*3/uL — ABNORMAL HIGH (ref 4.0–10.5)
nRBC: 0 % (ref 0.0–0.2)

## 2021-08-07 SURGERY — HEMIARTHROPLASTY, HIP, DIRECT ANTERIOR APPROACH, FOR FRACTURE
Anesthesia: General | Site: Hip | Laterality: Left

## 2021-08-07 MED ORDER — ACETAMINOPHEN 325 MG PO TABS
325.0000 mg | ORAL_TABLET | Freq: Four times a day (QID) | ORAL | Status: DC | PRN
  Administered 2021-08-08: 650 mg via ORAL
  Filled 2021-08-07: qty 2

## 2021-08-07 MED ORDER — ONDANSETRON HCL 4 MG PO TABS: 4.0000 mg | ORAL_TABLET | Freq: Four times a day (QID) | ORAL | Status: DC | PRN

## 2021-08-07 MED ORDER — PRAVASTATIN SODIUM 20 MG PO TABS
20.0000 mg | ORAL_TABLET | Freq: Every day | ORAL | Status: DC
  Administered 2021-08-07 – 2021-08-08 (×2): 20 mg via ORAL
  Filled 2021-08-07 (×2): qty 1

## 2021-08-07 MED ORDER — FENTANYL CITRATE (PF) 100 MCG/2ML IJ SOLN
INTRAMUSCULAR | Status: AC
  Filled 2021-08-07: qty 2

## 2021-08-07 MED ORDER — METOCLOPRAMIDE HCL 5 MG PO TABS: 5.0000 mg | ORAL_TABLET | Freq: Three times a day (TID) | ORAL | Status: DC | PRN

## 2021-08-07 MED ORDER — TRANEXAMIC ACID-NACL 1000-0.7 MG/100ML-% IV SOLN
1000.0000 mg | INTRAVENOUS | Status: AC
  Administered 2021-08-07: 1000 mg via INTRAVENOUS

## 2021-08-07 MED ORDER — POVIDONE-IODINE 10 % EX SWAB: 2.0000 | Freq: Once | CUTANEOUS | Status: DC

## 2021-08-07 MED ORDER — HYDROCODONE-ACETAMINOPHEN 7.5-325 MG PO TABS
1.0000 | ORAL_TABLET | ORAL | Status: DC | PRN
  Administered 2021-08-08: 1 via ORAL
  Filled 2021-08-07: qty 1

## 2021-08-07 MED ORDER — LEVOTHYROXINE SODIUM 25 MCG PO TABS
25.0000 ug | ORAL_TABLET | Freq: Every day | ORAL | Status: DC
Start: 2021-08-08 — End: 2021-08-09
  Administered 2021-08-08 – 2021-08-09 (×2): 25 ug via ORAL
  Filled 2021-08-07 (×2): qty 1

## 2021-08-07 MED ORDER — METOCLOPRAMIDE HCL 5 MG/ML IJ SOLN: 5.0000 mg | Freq: Three times a day (TID) | INTRAMUSCULAR | Status: DC | PRN

## 2021-08-07 MED ORDER — DONEPEZIL HCL 10 MG PO TABS
10.0000 mg | ORAL_TABLET | Freq: Every day | ORAL | Status: DC
Start: 2021-08-07 — End: 2021-08-09
  Administered 2021-08-07 – 2021-08-08 (×2): 10 mg via ORAL
  Filled 2021-08-07 (×2): qty 1

## 2021-08-07 MED ORDER — ONDANSETRON HCL 4 MG/2ML IJ SOLN: 4.0000 mg | Freq: Four times a day (QID) | INTRAMUSCULAR | Status: DC | PRN

## 2021-08-07 MED ORDER — TRANEXAMIC ACID-NACL 1000-0.7 MG/100ML-% IV SOLN
INTRAVENOUS | Status: AC
  Filled 2021-08-07: qty 100

## 2021-08-07 MED ORDER — FENTANYL CITRATE PF 50 MCG/ML IJ SOSY: 25.0000 ug | PREFILLED_SYRINGE | INTRAMUSCULAR | Status: DC | PRN

## 2021-08-07 MED ORDER — SODIUM CHLORIDE 0.9 % IR SOLN
Status: DC | PRN
  Administered 2021-08-07: 4000 mL

## 2021-08-07 MED ORDER — SODIUM CHLORIDE (PF) 0.9 % IJ SOLN
INTRAMUSCULAR | Status: AC
  Filled 2021-08-07: qty 50

## 2021-08-07 MED ORDER — SUGAMMADEX SODIUM 200 MG/2ML IV SOLN
INTRAVENOUS | Status: DC | PRN
  Administered 2021-08-07: 190 mg via INTRAVENOUS

## 2021-08-07 MED ORDER — CEFAZOLIN SODIUM-DEXTROSE 2-4 GM/100ML-% IV SOLN
2.0000 g | Freq: Four times a day (QID) | INTRAVENOUS | Status: AC
  Administered 2021-08-07 (×2): 2 g via INTRAVENOUS
  Filled 2021-08-07 (×2): qty 100

## 2021-08-07 MED ORDER — SENNA 8.6 MG PO TABS
1.0000 | ORAL_TABLET | Freq: Two times a day (BID) | ORAL | Status: DC
  Administered 2021-08-07 – 2021-08-09 (×4): 8.6 mg via ORAL
  Filled 2021-08-07 (×4): qty 1

## 2021-08-07 MED ORDER — ROCURONIUM BROMIDE 100 MG/10ML IV SOLN
INTRAVENOUS | Status: DC | PRN
  Administered 2021-08-07: 50 mg via INTRAVENOUS

## 2021-08-07 MED ORDER — LACTATED RINGERS IV SOLN: INTRAVENOUS | Status: DC | PRN

## 2021-08-07 MED ORDER — CHLORHEXIDINE GLUCONATE 4 % EX LIQD: 60.0000 mL | Freq: Once | CUTANEOUS | Status: DC

## 2021-08-07 MED ORDER — CELECOXIB 200 MG PO CAPS
200.0000 mg | ORAL_CAPSULE | Freq: Two times a day (BID) | ORAL | Status: DC
  Administered 2021-08-07 – 2021-08-09 (×4): 200 mg via ORAL
  Filled 2021-08-07 (×4): qty 1

## 2021-08-07 MED ORDER — FENTANYL CITRATE PF 50 MCG/ML IJ SOSY
PREFILLED_SYRINGE | INTRAMUSCULAR | Status: AC
  Filled 2021-08-07: qty 2

## 2021-08-07 MED ORDER — HYDROCODONE-ACETAMINOPHEN 5-325 MG PO TABS
1.0000 | ORAL_TABLET | ORAL | Status: DC | PRN
  Administered 2021-08-07 – 2021-08-08 (×3): 1 via ORAL
  Filled 2021-08-07 (×3): qty 1

## 2021-08-07 MED ORDER — POLYETHYLENE GLYCOL 3350 17 G PO PACK: 17.0000 g | PACK | Freq: Every day | ORAL | Status: DC | PRN

## 2021-08-07 MED ORDER — MENTHOL 3 MG MT LOZG: 1.0000 | LOZENGE | OROMUCOSAL | Status: DC | PRN

## 2021-08-07 MED ORDER — DEXAMETHASONE SODIUM PHOSPHATE 10 MG/ML IJ SOLN
INTRAMUSCULAR | Status: DC | PRN
  Administered 2021-08-07: 4 mg via INTRAVENOUS

## 2021-08-07 MED ORDER — METHOCARBAMOL 500 MG PO TABS
500.0000 mg | ORAL_TABLET | Freq: Four times a day (QID) | ORAL | Status: DC | PRN
  Administered 2021-08-08: 500 mg via ORAL
  Filled 2021-08-07: qty 1

## 2021-08-07 MED ORDER — ALPRAZOLAM 0.5 MG PO TABS
0.5000 mg | ORAL_TABLET | Freq: Two times a day (BID) | ORAL | Status: DC | PRN
  Administered 2021-08-07 – 2021-08-08 (×2): 0.5 mg via ORAL
  Filled 2021-08-07 (×2): qty 1

## 2021-08-07 MED ORDER — MEMANTINE HCL 10 MG PO TABS
10.0000 mg | ORAL_TABLET | Freq: Two times a day (BID) | ORAL | Status: DC
Start: 2021-08-07 — End: 2021-08-09
  Administered 2021-08-07 – 2021-08-09 (×5): 10 mg via ORAL
  Filled 2021-08-07 (×5): qty 1

## 2021-08-07 MED ORDER — PANTOPRAZOLE SODIUM 40 MG PO TBEC
80.0000 mg | DELAYED_RELEASE_TABLET | Freq: Every day | ORAL | Status: DC
  Administered 2021-08-07 – 2021-08-09 (×3): 80 mg via ORAL
  Filled 2021-08-07 (×3): qty 2

## 2021-08-07 MED ORDER — PROPOFOL 10 MG/ML IV BOLUS
INTRAVENOUS | Status: DC | PRN
  Administered 2021-08-07 (×2): 30 mg via INTRAVENOUS

## 2021-08-07 MED ORDER — SODIUM CHLORIDE (PF) 0.9 % IJ SOLN
INTRAMUSCULAR | Status: DC | PRN
  Administered 2021-08-07: 29 mL

## 2021-08-07 MED ORDER — STERILE WATER FOR IRRIGATION IR SOLN
Status: DC | PRN
  Administered 2021-08-07: 2000 mL

## 2021-08-07 MED ORDER — BUPIVACAINE-EPINEPHRINE (PF) 0.5% -1:200000 IJ SOLN
INTRAMUSCULAR | Status: DC | PRN
  Administered 2021-08-07: 30 mL

## 2021-08-07 MED ORDER — FENTANYL CITRATE (PF) 100 MCG/2ML IJ SOLN
INTRAMUSCULAR | Status: DC | PRN
Start: 2021-08-07 — End: 2021-08-07
  Administered 2021-08-07: 50 ug via INTRAVENOUS
  Administered 2021-08-07 (×2): 25 ug via INTRAVENOUS

## 2021-08-07 MED ORDER — MORPHINE SULFATE (PF) 2 MG/ML IV SOLN: 0.5000 mg | INTRAVENOUS | Status: DC | PRN

## 2021-08-07 MED ORDER — DOCUSATE SODIUM 100 MG PO CAPS
100.0000 mg | ORAL_CAPSULE | Freq: Two times a day (BID) | ORAL | Status: DC
  Administered 2021-08-07 – 2021-08-09 (×5): 100 mg via ORAL
  Filled 2021-08-07 (×5): qty 1

## 2021-08-07 MED ORDER — METHOCARBAMOL 1000 MG/10ML IJ SOLN: 500.0000 mg | Freq: Four times a day (QID) | INTRAVENOUS | Status: DC | PRN

## 2021-08-07 MED ORDER — ALBUTEROL SULFATE (2.5 MG/3ML) 0.083% IN NEBU
2.5000 mg | INHALATION_SOLUTION | Freq: Three times a day (TID) | RESPIRATORY_TRACT | Status: DC | PRN
Start: 2021-08-07 — End: 2021-08-09

## 2021-08-07 MED ORDER — QUETIAPINE FUMARATE 50 MG PO TABS
100.0000 mg | ORAL_TABLET | Freq: Two times a day (BID) | ORAL | Status: DC
  Administered 2021-08-07 – 2021-08-09 (×5): 100 mg via ORAL
  Filled 2021-08-07 (×5): qty 2

## 2021-08-07 MED ORDER — LORATADINE 10 MG PO TABS
10.0000 mg | ORAL_TABLET | Freq: Every day | ORAL | Status: DC
  Administered 2021-08-07 – 2021-08-09 (×3): 10 mg via ORAL
  Filled 2021-08-07 (×3): qty 1

## 2021-08-07 MED ORDER — PHENYLEPHRINE HCL-NACL 20-0.9 MG/250ML-% IV SOLN
INTRAVENOUS | Status: DC | PRN
  Administered 2021-08-07: 25 ug/min via INTRAVENOUS

## 2021-08-07 MED ORDER — LIDOCAINE 2% (20 MG/ML) 5 ML SYRINGE
INTRAMUSCULAR | Status: DC | PRN
  Administered 2021-08-07: 50 mg via INTRAVENOUS

## 2021-08-07 MED ORDER — PHENOL 1.4 % MT LIQD: 1.0000 | OROMUCOSAL | Status: DC | PRN

## 2021-08-07 MED ORDER — CEFAZOLIN SODIUM-DEXTROSE 2-4 GM/100ML-% IV SOLN
INTRAVENOUS | Status: AC
  Filled 2021-08-07: qty 100

## 2021-08-07 MED ORDER — CEFAZOLIN SODIUM-DEXTROSE 2-4 GM/100ML-% IV SOLN
2.0000 g | INTRAVENOUS | Status: AC
  Administered 2021-08-07: 2 g via INTRAVENOUS

## 2021-08-07 MED ORDER — 0.9 % SODIUM CHLORIDE (POUR BTL) OPTIME
TOPICAL | Status: DC | PRN
  Administered 2021-08-07: 1000 mL

## 2021-08-07 MED ORDER — KETOROLAC TROMETHAMINE 30 MG/ML IJ SOLN
INTRAMUSCULAR | Status: AC
  Filled 2021-08-07: qty 1

## 2021-08-07 MED ORDER — PROPOFOL 500 MG/50ML IV EMUL
INTRAVENOUS | Status: DC | PRN
  Administered 2021-08-07: 75 ug/kg/min via INTRAVENOUS

## 2021-08-07 MED ORDER — PROPOFOL 10 MG/ML IV BOLUS
INTRAVENOUS | Status: AC
  Filled 2021-08-07: qty 20

## 2021-08-07 MED ORDER — SERTRALINE HCL 25 MG PO TABS
25.0000 mg | ORAL_TABLET | Freq: Every day | ORAL | Status: DC
Start: 2021-08-07 — End: 2021-08-09
  Administered 2021-08-07 – 2021-08-08 (×2): 25 mg via ORAL
  Filled 2021-08-07 (×2): qty 1

## 2021-08-07 MED ORDER — KETOROLAC TROMETHAMINE 30 MG/ML IJ SOLN
INTRAMUSCULAR | Status: DC | PRN
  Administered 2021-08-07: 30 mg

## 2021-08-07 MED ORDER — ONDANSETRON HCL 4 MG/2ML IJ SOLN
INTRAMUSCULAR | Status: DC | PRN
  Administered 2021-08-07: 4 mg via INTRAVENOUS

## 2021-08-07 MED ORDER — PRONTOSAN WOUND IRRIGATION OPTIME
TOPICAL | Status: DC | PRN
  Administered 2021-08-07: 1 via TOPICAL

## 2021-08-07 MED ORDER — ONDANSETRON HCL 4 MG/2ML IJ SOLN: 4.0000 mg | Freq: Once | INTRAMUSCULAR | Status: DC | PRN

## 2021-08-07 MED ORDER — ACETAMINOPHEN 500 MG PO TABS: 1000.0000 mg | ORAL_TABLET | Freq: Once | ORAL | Status: DC

## 2021-08-07 MED ORDER — DARIFENACIN HYDROBROMIDE ER 7.5 MG PO TB24
7.5000 mg | ORAL_TABLET | Freq: Every day | ORAL | Status: DC
  Administered 2021-08-07 – 2021-08-09 (×3): 7.5 mg via ORAL
  Filled 2021-08-07 (×3): qty 1

## 2021-08-07 MED ORDER — ASPIRIN 81 MG PO CHEW
81.0000 mg | CHEWABLE_TABLET | Freq: Two times a day (BID) | ORAL | Status: DC
  Administered 2021-08-07 – 2021-08-09 (×4): 81 mg via ORAL
  Filled 2021-08-07 (×4): qty 1

## 2021-08-07 MED ORDER — SODIUM CHLORIDE 0.9 % IV SOLN: INTRAVENOUS | Status: DC

## 2021-08-07 MED ORDER — BUPIVACAINE-EPINEPHRINE (PF) 0.5% -1:200000 IJ SOLN
INTRAMUSCULAR | Status: AC
  Filled 2021-08-07: qty 30

## 2021-08-07 SURGICAL SUPPLY — 55 items
BAG COUNTER SPONGE SURGICOUNT (BAG) ×2 IMPLANT
CHLORAPREP W/TINT 26 (MISCELLANEOUS) ×2 IMPLANT
COVER SURGICAL LIGHT HANDLE (MISCELLANEOUS) ×2 IMPLANT
DERMABOND ADVANCED (GAUZE/BANDAGES/DRESSINGS) ×2
DERMABOND ADVANCED .7 DNX12 (GAUZE/BANDAGES/DRESSINGS) ×2 IMPLANT
DRAPE IMP U-DRAPE 54X76 (DRAPES) ×2 IMPLANT
DRAPE SHEET LG 3/4 BI-LAMINATE (DRAPES) ×4 IMPLANT
DRAPE STERI IOBAN 125X83 (DRAPES) ×2 IMPLANT
DRAPE U-SHAPE 47X51 STRL (DRAPES) ×4 IMPLANT
DRESSING AQUACEL AG SP 3.5X10 (GAUZE/BANDAGES/DRESSINGS) IMPLANT
DRSG AQUACEL AG ADV 3.5X10 (GAUZE/BANDAGES/DRESSINGS) ×2 IMPLANT
DRSG AQUACEL AG SP 3.5X10 (GAUZE/BANDAGES/DRESSINGS) ×2
ELECT REM PT RETURN 15FT ADLT (MISCELLANEOUS) ×2 IMPLANT
GLOVE BIO SURGEON STRL SZ8 (GLOVE) ×2 IMPLANT
GLOVE BIO SURGEON STRL SZ8.5 (GLOVE) ×4 IMPLANT
GLOVE BIOGEL PI IND STRL 7.5 (GLOVE) ×1 IMPLANT
GLOVE BIOGEL PI IND STRL 8 (GLOVE) ×1 IMPLANT
GLOVE BIOGEL PI IND STRL 8.5 (GLOVE) ×1 IMPLANT
GLOVE BIOGEL PI INDICATOR 7.5 (GLOVE) ×2
GLOVE BIOGEL PI INDICATOR 8 (GLOVE) ×1
GLOVE BIOGEL PI INDICATOR 8.5 (GLOVE) ×1
GLOVE SURG LX 7.5 STRW (GLOVE) ×2
GLOVE SURG LX STRL 7.5 STRW (GLOVE) ×2 IMPLANT
GOWN STRL REUS W/ TWL XL LVL3 (GOWN DISPOSABLE) IMPLANT
GOWN STRL REUS W/TWL 2XL LVL3 (GOWN DISPOSABLE) ×2 IMPLANT
GOWN STRL REUS W/TWL XL LVL3 (GOWN DISPOSABLE) ×2
HANDPIECE INTERPULSE COAX TIP (DISPOSABLE) ×1
HEAD MOD COCR 28MM HD -3MM NK (Orthopedic Implant) ×1 IMPLANT
HOOD PEEL AWAY FLYTE STAYCOOL (MISCELLANEOUS) ×6 IMPLANT
KIT TURNOVER KIT A (KITS) IMPLANT
MANIFOLD NEPTUNE II (INSTRUMENTS) ×2 IMPLANT
MARKER SKIN DUAL TIP RULER LAB (MISCELLANEOUS) ×2 IMPLANT
NDL SPNL 18GX3.5 QUINCKE PK (NEEDLE) ×1 IMPLANT
NEEDLE SPNL 18GX3.5 QUINCKE PK (NEEDLE) ×2 IMPLANT
PACK ANTERIOR HIP CUSTOM (KITS) ×2 IMPLANT
PENCIL SMOKE EVACUATOR (MISCELLANEOUS) ×1 IMPLANT
RINGBLOC BI POLAR 28X41MM (Orthopedic Implant) ×1 IMPLANT
SAW OSC TIP CART 19.5X105X1.3 (SAW) ×2 IMPLANT
SEALER BIPOLAR AQUA 6.0 (INSTRUMENTS) ×2 IMPLANT
SET HNDPC FAN SPRY TIP SCT (DISPOSABLE) ×1 IMPLANT
SOLUTION PRONTOSAN WOUND 350ML (IRRIGATION / IRRIGATOR) ×1 IMPLANT
STAPLER VISISTAT 35W (STAPLE) ×1 IMPLANT
STEM FEM CMTLS HO 9X137 123D (Stem) ×1 IMPLANT
SUT ETHIBOND NAB CT1 #1 30IN (SUTURE) ×4 IMPLANT
SUT MNCRL AB 3-0 PS2 18 (SUTURE) ×2 IMPLANT
SUT MON AB 2-0 CT1 36 (SUTURE) ×2 IMPLANT
SUT STRATAFIX PDO 1 14 VIOLET (SUTURE) ×1
SUT STRATFX PDO 1 14 VIOLET (SUTURE) ×1
SUT VIC AB 1 CT1 27 (SUTURE) ×1
SUT VIC AB 1 CT1 27XBRD ANTBC (SUTURE) ×1 IMPLANT
SUT VIC AB 2-0 CT1 27 (SUTURE) ×1
SUT VIC AB 2-0 CT1 TAPERPNT 27 (SUTURE) ×1 IMPLANT
SUTURE STRATFX PDO 1 14 VIOLET (SUTURE) ×1 IMPLANT
TRAY FOLEY MTR SLVR 14FR STAT (SET/KITS/TRAYS/PACK) ×1 IMPLANT
TUBE SUCTION HIGH CAP CLEAR NV (SUCTIONS) ×2 IMPLANT

## 2021-08-07 NOTE — Progress Notes (Signed)
PROGRESS NOTE    Mackenzie Peterson  JWJ:[id number] DOB: 1924-10-18 DOA: 08/06/2021 PCP: [name], MD    Brief Narrative:   Mackenzie Peterson is a 88 y.o. female with past medical history significant for COPD/chronic hypoxic respiratory failure on 2 L [place] at baseline, dementia, essential hypertension, hypothyroidism, hyperlipidemia who presented to [facility] ED via EMS following fall at home.  History obtained from daughter due to patient's underlying dementia.  Patient was apparently trying to move from the kitchen to the table last night and fell onto her left side.  She did not hit her head and no loss of consciousness reported.  Patient was adamant about not getting checked out overnight but the following morning she started developing significant left hip pain and unable to ambulate.  Patient denies chest pain, palpitations, no lightheadedness/dizziness.  Given her significant pain and inability to ambulate, patient was brought to the ED for further evaluation.  In the ED, temperature 97.6 F, HR 75, RR 16, BP 136/78, SPO2 100% on room air.  WBC count 11.6, hemoglobin 16.0, platelets 190.  Sodium 138, potassium 3.8, chloride 104, CO2 27, glucose 127, BUN 27, creatinine 0.73.  Left hip/pelvis with transcervical left femoral neck fracture with varus angulation.  Left knee x-ray with no acute fracture/dislocation.  Orthopedics was consulted.  TRH consulted for further evaluation and management of left hip fracture.  Assessment & Plan:    Displaced left femoral neck fracture Patient presenting to ED following mechanical fall at home without loss of consciousness.  Unable to ambulate with significant left hip pain.  Left hip/pelvis x-ray on arrival with transcervical left femoral neck fracture with varus angulation.  Orthopedics was consulted and patient underwent anterior approach left hip hemiarthroplasty by Dr. Linna Caprice on 08/07/2021. --Norco PO q4h PRN moderate pain --Morphine q2h PRN severe  pain --Robaxin as needed muscle spasms --Aspirin 81 mg p.o. twice daily for DVT prophylaxis per orthopedics --PT/OT evaluation  Essential hypertension Currently not on any antihypertensives outpatient.  BP 135/51 this morning. --Continue monitor BP closely  COPD Chronic hypoxic respiratory failure on home oxygen --Albuterol neb as needed shortness of breath/wheezing --Continue home oxygen, 2 L per nasal cannula  Hyperlipidemia: Continue pravastatin 20 mg p.o. daily  Dementia --Delirium precautions --Get up during the day --Encourage a familiar face to remain present throughout the day --Keep blinds open and lights on during daylight hours --Minimize the use of opioids/benzodiazepines --Namenda 10 mg p.o. twice daily --Donepezil 10 mg p.o. nightly --Seroquel 25 mg p.o. nightly  Hypothyroidism: Levothyroxine 25 mcg p.o. daily  GERD: Continue PPI  Anxiety/depression: --Sertraline 25 mg p.o. daily --Alprazolam 0.5 mg p.o. twice daily as needed anxiety  DVT prophylaxis: SCDs Start: 08/06/21 1937    Code Status: Full Code Family Communication:   Disposition Plan:  Level of care: Med-Surg Status is: Inpatient Remains inpatient appropriate because: Pending surgical repair left femur fracture this morning, will need PT/OT evaluation postoperatively; anticipate need for SNF placement.    Consultants:  Orthopedics, Dr. Linna Caprice  Procedures:  Left hip hemiarthroplasty, anterior approach; Dr. Linna Caprice 7/9  Antimicrobials:  Perioperative cefazolin   Subjective: Patient seen and examined at bedside, resting comfortably.  Seen in PACU; with PACU nurse present.  Pleasantly confused.  Awaiting for bed upstairs.  No acute concerns overnight per nursing staff.  Objective: Vitals:   08/07/21 0545 08/07/21 0615 08/07/21 0926 08/07/21 0930  BP: (!) 135/48 (!) 135/51 (!) 124/48 (!) 120/48  Pulse:  77 (!) 57 (!) 57  Resp:  15 13 12   Temp:   (!) 97.2 F (36.2 C)   SpO2:  94%  100% 100%    Intake/Output Summary (Last 24 hours) at 08/07/2021 0950 Last data filed at 08/07/2021 0905 Gross per 24 hour  Intake 1000 ml  Output 400 ml  Net 600 ml   There were no vitals filed for this visit.  Examination:  Physical Exam: GEN: NAD, alert; pleasantly confused, elderly in appearance HEENT: NCAT, PERRL, EOMI, sclera clear, MMM PULM: CTAB w/o wheezes/crackles, normal respiratory effort, on 2 L nasal cannula which is her baseline CV: RRR w/o M/G/R GI: abd soft, NTND, NABS, no R/G/M MSK: no peripheral edema, moving all extremities independently, left hip with surgical dressing in place, clean/dry/intact without erythema/fluctuance NEURO: No focal deficits Integumentary: dry/intact, no rashes or wounds    Data Reviewed: I have personally reviewed following labs and imaging studies  CBC: Recent Labs  Lab 08/06/21 1242 08/07/21 0300  WBC 11.6* 10.7*  HGB 16.0* 15.0  HCT 48.3* 45.8  MCV 95.5 96.2  PLT 190 157   Basic Metabolic Panel: Recent Labs  Lab 08/06/21 1242 08/07/21 0300  NA 138 138  K 3.8 4.1  CL 104 107  CO2 27 24  GLUCOSE 127* 117*  BUN 27* 20  CREATININE 0.73 0.56  CALCIUM 8.9 8.2*   GFR: CrCl cannot be calculated (Unknown ideal weight.). Liver Function Tests: No results for input(s): "AST", "ALT", "ALKPHOS", "BILITOT", "PROT", "ALBUMIN" in the last 168 hours. No results for input(s): "LIPASE", "AMYLASE" in the last 168 hours. No results for input(s): "AMMONIA" in the last 168 hours. Coagulation Profile: No results for input(s): "INR", "PROTIME" in the last 168 hours. Cardiac Enzymes: No results for input(s): "CKTOTAL", "CKMB", "CKMBINDEX", "TROPONINI" in the last 168 hours. BNP (last 3 results) No results for input(s): "PROBNP" in the last 8760 hours. HbA1C: No results for input(s): "HGBA1C" in the last 72 hours. CBG: No results for input(s): "GLUCAP" in the last 168 hours. Lipid Profile: No results for input(s): "CHOL", "HDL",  "LDLCALC", "TRIG", "CHOLHDL", "LDLDIRECT" in the last 72 hours. Thyroid Function Tests: No results for input(s): "TSH", "T4TOTAL", "FREET4", "T3FREE", "THYROIDAB" in the last 72 hours. Anemia Panel: No results for input(s): "VITAMINB12", "FOLATE", "FERRITIN", "TIBC", "IRON", "RETICCTPCT" in the last 72 hours. Sepsis Labs: No results for input(s): "PROCALCITON", "LATICACIDVEN" in the last 168 hours.  No results found for this or any previous visit (from the past 240 hour(s)).       Radiology Studies: DG C-Arm 1-60 Min-No Report  Result Date: 08/07/2021 Fluoroscopy was utilized by the requesting physician.  No radiographic interpretation.   DG Knee Left Port  Result Date: 08/06/2021 CLINICAL DATA:  Left hip fracture, fall. EXAM: PORTABLE LEFT KNEE - 1-2 VIEW COMPARISON:  None Available. FINDINGS: No acute fracture or dislocation. Mild to moderate tricompartmental degenerative changes are noted. No joint effusion. The soft tissues are unremarkable. IMPRESSION: No acute fracture or dislocation. Electronically Signed   By: 10/07/2021 M.D.   On: 08/06/2021 21:08   DG Hip Unilat With Pelvis 2-3 Views Left  Result Date: 08/06/2021 CLINICAL DATA:  Fall.  Left hip pain. EXAM: DG HIP (WITH OR WITHOUT PELVIS) 2-3V LEFT COMPARISON:  None Available. FINDINGS: Bones are diffusely demineralized. Status post ORIF for proximal right femur fracture. Right femoral intramedullary nail has been incompletely visualized. AP view of the left hip shows a transcervical femoral neck fracture with some cranial over riding of the distal fragment and varus angulation. This is  largely obscured on the lateral projections. IMPRESSION: 1. Transcervical left femoral neck fracture with varus angulation. 2. Status post ORIF for proximal right femur fracture. 3. Diffuse bony demineralization. Electronically Signed   By: Kennith Center M.D.   On: 08/06/2021 13:55        Scheduled Meds:  acetaminophen  1,000 mg Oral Once    chlorhexidine  60 mL Topical Once   fentaNYL       povidone-iodine  2 Application Topical Once   Continuous Infusions:  sodium chloride 75 mL/hr at 08/07/21 0653     LOS: 1 day    Time spent: 51 minutes spent on chart review, discussion with nursing staff, consultants, updating family and interview/physical exam; more than 50% of that time was spent in counseling and/or coordination of care.    Alvira Philips [place], DO [facility] Available via Epic secure chat 7am-7pm After these hours, please refer to coverage provider listed on [email or web address] 08/07/2021, 9:50 AM

## 2021-08-07 NOTE — Consult Note (Signed)
ORTHOPAEDIC CONSULTATION  REQUESTING PHYSICIAN: [place], Alvira Philips, DO  PCP:  [name], MD  Chief Complaint: left hip fracture  HPI: Mackenzie Peterson is a 88 y.o. female with a past medical history of Alzheimer's disease, PVD, COPD, chronic hypoxic respiratory failure on 2 L nasal cannula who sustained a ground-level fall at home.  She fell onto her left hip.  She had left hip pain and inability to weight-bear.  She was brought to the emergency department at [facility], where x-rays revealed a displaced subcapital left femoral neck fracture.  She was admitted to the hospitalist service for perioperative risk stratification and medical optimization.  Orthopedic consultation was placed for management of her hip fracture.  Past Medical History:  Diagnosis Date   Alzheimer disease (HCC)    Asthma    Chronic bronchitis (HCC)    Dysthymic disorder    Emphysema    Hyperlipidemia    Hypertension    hypothyroid    PVD (peripheral vascular disease) (HCC)    Past Surgical History:  Procedure Laterality Date   INTRAMEDULLARY (IM) NAIL INTERTROCHANTERIC Right 11/28/2019   Procedure: INTRAMEDULLARY (IM) NAIL INTERTROCHANTRIC;  Surgeon: Yolonda Kida, MD;  Location: [facility] OR;  Service: Orthopedics;  Laterality: Right;   Social History   Socioeconomic History   Marital status: Widowed    Spouse name: Not on file   Number of children: Not on file   Years of education: Not on file   Highest education level: Not on file  Occupational History   Not on file  Tobacco Use   Smoking status: Never   Smokeless tobacco: Never  Substance and Sexual Activity   Alcohol use: No   Drug use: No   Sexual activity: Not on file  Other Topics Concern   Not on file  Social History Narrative   Not on file   Social Determinants of Health   Financial Resource Strain: Not on file  Food Insecurity: Not on file  Transportation Needs: Not on file  Physical Activity: Not on file   Stress: Not on file  Social Connections: Not on file   History reviewed. No pertinent family history. No Known Allergies Prior to Admission medications   Medication Sig Start Date End Date Taking? Authorizing Provider  acetaminophen (TYLENOL) 500 MG tablet Take 500-1,000 mg by mouth every 6 (six) hours as needed for mild pain or headache.   Yes [provider]  albuterol (PROVENTIL) (2.5 MG/3ML) 0.083% nebulizer solution Take 2.5 mg by nebulization 3 (three) times daily as needed for wheezing or shortness of breath.    Yes [provider]  ALPRAZolam (XANAX) 0.5 MG tablet Take 1 tablet (0.5 mg total) by mouth 3 (three) times daily as needed for anxiety. Patient taking differently: Take 0.5 mg by mouth 2 (two) times daily. 12/04/19  Yes Marinda Elk, MD  aspirin 81 MG tablet Take 81 mg by mouth daily.   Yes [provider]  cetirizine (ZYRTEC) 10 MG tablet Take 10 mg by mouth at bedtime.   Yes [provider]  donepezil (ARICEPT) 10 MG tablet Take 10 mg by mouth at bedtime. 09/10/19  Yes [provider]  ibuprofen (ADVIL) 200 MG tablet Take 200 mg by mouth every 6 (six) hours as needed for mild pain.   Yes [provider]  levothyroxine (SYNTHROID, LEVOTHROID) 25 MCG tablet Take 25 mcg by mouth daily before breakfast.    Yes [provider]  memantine (NAMENDA) 10 MG  tablet Take 10 mg by mouth 2 (two) times daily.   Yes [provider]  Multiple Vitamins-Minerals (ONE-A-DAY PROACTIVE 65+) TABS Take 1 tablet by mouth daily with breakfast.   Yes [provider]  nitroGLYCERIN (NITROSTAT) 0.4 MG SL tablet Place 0.4 mg under the tongue every 5 (five) minutes as needed for chest pain.    Yes [provider]  omeprazole (PRILOSEC) 20 MG capsule TAKE 1 CAPSULE BY MOUTH 30 TO 60 MINUTES BEFORE THE FIRST AND LAST MEALS OF THE DAY Patient taking differently: Take 20 mg by mouth 2 (two) times daily before a  meal.   Yes Ramaswamy, Murali, MD  pravastatin (PRAVACHOL) 20 MG tablet Take 20 mg by mouth at bedtime. 04/17/19  Yes [provider]  QUEtiapine (SEROQUEL) 100 MG tablet Take 100 mg by mouth 2 (two) times daily.   Yes [provider]  sertraline (ZOLOFT) 25 MG tablet Take 25 mg by mouth at bedtime. 04/20/19  Yes [provider]  solifenacin (VESICARE) 5 MG tablet Take 5 mg by mouth at bedtime.    Yes [provider]  traZODone (DESYREL) 50 MG tablet Take 50 mg by mouth at bedtime.   Yes [provider]  enoxaparin (LOVENOX) 30 MG/0.3ML injection Inject 0.3 mLs (30 mg total) into the skin daily. 12/04/19 01/03/20  Marinda Elk, MD  levETIRAcetam (KEPPRA) 250 MG tablet Take 1 tablet (250 mg total) by mouth 2 (two) times daily. Patient not taking: Reported on 08/06/2021 12/04/19   Marinda Elk, MD  OXYGEN Inhale 2 L/min into the lungs at bedtime.    [provider]  traMADol (ULTRAM) 50 MG tablet Take 1 tablet (50 mg total) by mouth every 6 (six) hours as needed for up to 25 doses for moderate pain. Patient not taking: Reported on 08/06/2021 12/04/19   Marinda Elk, MD   DG Knee Left Port  Result Date: 08/06/2021 CLINICAL DATA:  Left hip fracture, fall. EXAM: PORTABLE LEFT KNEE - 1-2 VIEW COMPARISON:  None Available. FINDINGS: No acute fracture or dislocation. Mild to moderate tricompartmental degenerative changes are noted. No joint effusion. The soft tissues are unremarkable. IMPRESSION: No acute fracture or dislocation. Electronically Signed   By: Thornell Sartorius M.D.   On: 08/06/2021 21:08   DG Hip Unilat With Pelvis 2-3 Views Left  Result Date: 08/06/2021 CLINICAL DATA:  Fall.  Left hip pain. EXAM: DG HIP (WITH OR WITHOUT PELVIS) 2-3V LEFT COMPARISON:  None Available. FINDINGS: Bones are diffusely demineralized. Status post ORIF for proximal right femur fracture. Right femoral intramedullary nail has been incompletely visualized.  AP view of the left hip shows a transcervical femoral neck fracture with some cranial over riding of the distal fragment and varus angulation. This is largely obscured on the lateral projections. IMPRESSION: 1. Transcervical left femoral neck fracture with varus angulation. 2. Status post ORIF for proximal right femur fracture. 3. Diffuse bony demineralization. Electronically Signed   By: Kennith Center M.D.   On: 08/06/2021 13:55    Positive ROS: All other systems have been reviewed and were otherwise negative with the exception of those mentioned in the HPI and as above.  Physical Exam: General: Alert, no acute distress Cardiovascular: No pedal edema Respiratory: No cyanosis, no use of accessory musculature GI: No organomegaly, abdomen is soft and non-tender Skin: No lesions in the area of chief complaint Neurologic: Sensation intact distally Psychiatric: Dementia  lymphatic: No axillary or cervical lymphadenopathy  MUSCULOSKELETAL: Examination of the left  hip reveals no skin wounds or lesions.  She is shortened and externally rotated.  Pain with logrolling of the hip.  Palpable pedal pulses.  No focal motor or sensory deficit  Assessment: Displaced left femoral neck fracture  Plan: I discussed the findings with the patient and her daughter-in-law over the telephone.  She has a displaced left femoral neck fracture that requires surgical treatment.  We discussed the risk, benefits, and alternatives to anterior approach left hip hemiarthroplasty.  Please see statement of risk.  Plan for surgery today.  Continue NPO.  All questions were solicited and answered.  The risks, benefits, and alternatives were discussed with the patient. There are risks associated with the surgery including, but not limited to, problems with anesthesia (death), infection, instability (giving out of the joint), dislocation, differences in leg length/angulation/rotation, fracture of bones, loosening or failure of  implants, hematoma (blood accumulation) which may require surgical drainage, blood clots, pulmonary embolism, nerve injury (foot drop and lateral thigh numbness), and blood vessel injury. The patient understands these risks and elects to proceed.   Jonette Pesa, MD [phone]    08/07/2021 7:11 AM

## 2021-08-07 NOTE — Discharge Instructions (Signed)
? ?Dr. Keller Mikels ?Joint Replacement Specialist ?[facility] ?[address] ?[place], [place] [address] ?[phone] ? ? ?TOTAL HIP REPLACEMENT POSTOPERATIVE DIRECTIONS ? ? ? ?Hip Rehabilitation, Guidelines Following Surgery  ? ?WEIGHT BEARING ?Weight bearing as tolerated with assist device (walker, cane, etc) as directed, use it as long as suggested by your surgeon or therapist, typically at least 4-6 weeks. ? ?The results of a hip operation are greatly improved after range of motion and muscle strengthening exercises. Follow all safety measures which are given to protect your hip. If any of these exercises cause increased pain or swelling in your joint, decrease the amount until you are comfortable again. Then slowly increase the exercises. Call your caregiver if you have problems or questions.  ? ?HOME CARE INSTRUCTIONS  ?Most of the following instructions are designed to prevent the dislocation of your new hip.  ?Remove items at home which could result in a fall. This includes throw rugs or furniture in walking pathways.  ?Continue medications as instructed at time of discharge. ?You may have some home medications which will be placed on hold until you complete the course of blood thinner medication. ?You may start showering once you are discharged home. Do not remove your dressing. ?Do not put on socks or shoes without following the instructions of your caregivers.   ?Sit on chairs with arms. Use the chair arms to help push yourself up when arising.  ?Arrange for the use of a toilet seat elevator so you are not sitting low.  ?Walk with walker as instructed.  ?You may resume a sexual relationship in one month or when given the OK by your caregiver.  ?Use walker as long as suggested by your caregivers.  ?You may put full weight on your legs and walk as much as is comfortable. ?Avoid periods of inactivity such as sitting longer than an hour when not asleep. This helps prevent blood  clots.  ?You may return to work once you are cleared by your surgeon.  ?Do not drive a car for 6 weeks or until released by your surgeon.  ?Do not drive while taking narcotics.  ?Wear elastic stockings for two weeks following surgery during the day but you may remove then at night.  ?Make sure you keep all of your appointments after your operation with all of your doctors and caregivers. You should call the office at the above phone number and make an appointment for approximately two weeks after the date of your surgery. ?Please pick up a stool softener and laxative for home use as long as you are requiring pain medications. ?ICE to the affected hip every three hours for 30 minutes at a time and then as needed for pain and swelling. Continue to use ice on the hip for pain and swelling from surgery. You may notice swelling that will progress down to the foot and ankle.  This is normal after surgery.  Elevate the leg when you are not up walking on it.   ?It is important for you to complete the blood thinner medication as prescribed by your doctor. ?Continue to use the breathing machine which will help keep your temperature down.  It is common for your temperature to cycle up and down following surgery, especially at night when you are not up moving around and exerting yourself.  The breathing machine keeps your lungs expanded and your temperature down. ? ?RANGE OF MOTION AND STRENGTHENING EXERCISES  ?These exercises are designed to help you   keep full movement of your hip joint. Follow your caregiver's or physical therapist's instructions. Perform all exercises about fifteen times, three times per day or as directed. Exercise both hips, even if you have had only one joint replacement. These exercises can be done on a training (exercise) mat, on the floor, on a table or on a bed. Use whatever works the best and is most comfortable for you. Use music or television while you are exercising so that the exercises are a  pleasant break in your day. This will make your life better with the exercises acting as a break in routine you can look forward to.  ?Lying on your back, slowly slide your foot toward your buttocks, raising your knee up off the floor. Then slowly slide your foot back down until your leg is straight again.  ?Lying on your back spread your legs as far apart as you can without causing discomfort.  ?Lying on your side, raise your upper leg and foot straight up from the floor as far as is comfortable. Slowly lower the leg and repeat.  ?Lying on your back, tighten up the muscle in the front of your thigh (quadriceps muscles). You can do this by keeping your leg straight and trying to raise your heel off the floor. This helps strengthen the largest muscle supporting your knee.  ?Lying on your back, tighten up the muscles of your buttocks both with the legs straight and with the knee bent at a comfortable angle while keeping your heel on the floor.  ? ?SKILLED REHAB INSTRUCTIONS: ?If the patient is transferred to a skilled rehab facility following release from the hospital, a list of the current medications will be sent to the facility for the patient to continue.  When discharged from the skilled rehab facility, please have the facility set up the patient's Home Health Physical Therapy prior to being released. Also, the skilled facility will be responsible for providing the patient with their medications at time of release from the facility to include their pain medication and their blood thinner medication. If the patient is still at the rehab facility at time of the two week follow up appointment, the skilled rehab facility will also need to assist the patient in arranging follow up appointment in our office and any transportation needs. ? ?POST-OPERATIVE OPIOID TAPER INSTRUCTIONS: ?It is important to wean off of your opioid medication as soon as possible. If you do not need pain medication after your surgery it is ok  to stop day one. ?Opioids include: ?Codeine, Hydrocodone(Norco, Vicodin), Oxycodone(Percocet, oxycontin) and hydromorphone amongst others.  ?Long term and even short term use of opiods can cause: ?Increased pain response ?Dependence ?Constipation ?Depression ?Respiratory depression ?And more.  ?Withdrawal symptoms can include ?Flu like symptoms ?Nausea, vomiting ?And more ?Techniques to manage these symptoms ?Hydrate well ?Eat regular healthy meals ?Stay active ?Use relaxation techniques(deep breathing, meditating, yoga) ?Do Not substitute Alcohol to help with tapering ?If you have been on opioids for less than two weeks and do not have pain than it is ok to stop all together.  ?Plan to wean off of opioids ?This plan should start within one week post op of your joint replacement. ?Maintain the same interval or time between taking each dose and first decrease the dose.  ?Cut the total daily intake of opioids by one tablet each day ?Next start to increase the time between doses. ?The last dose that should be eliminated is the evening dose.  ? ? ?MAKE   SURE YOU:  ?Understand these instructions.  ?Will watch your condition.  ?Will get help right away if you are not doing well or get worse. ? ?Pick up stool softner and laxative for home use following surgery while on pain medications. ?Do not remove your dressing. ?The dressing is waterproof--it is OK to take showers. ?Continue to use ice for pain and swelling after surgery. ?Do not use any lotions or creams on the incision until instructed by your surgeon. ?Total Hip Protocol. ? ?

## 2021-08-07 NOTE — Op Note (Signed)
OPERATIVE REPORT  SURGEON: Samson Frederic, MD   ASSISTANT: Staff  PREOPERATIVE DIAGNOSIS: Displaced Left femoral neck fracture.   POSTOPERATIVE DIAGNOSIS: Displaced Left femoral neck fracture.   PROCEDURE: Left hip hemiarthroplasty, anterior approach.   IMPLANTS: Biomet Taperloc Complete Reduced Distal stem, size 9 x 137 mm, high offset, with a 28 - 3 mm metal head ball and a 41 mm bipolar head ball.  ANESTHESIA:  General  ANTIBIOTICS: 2g ancef.  ESTIMATED BLOOD LOSS:-100 mL    DRAINS: None.  COMPLICATIONS: None   CONDITION: PACU - hemodynamically stable.   BRIEF CLINICAL NOTE: Mackenzie Peterson is a 88 y.o. female with a displaced Left femoral neck fracture. The patient was admitted to the hospitalist service and underwent perioperative risk stratification and medical optimization. The risks, benefits, and alternatives to hemiarthroplasty were explained, and the patient elected to proceed.  PROCEDURE IN DETAIL: The patient was taken to the operating room and spinal anesthesia was attempted unsuccessfully. General anesthesia was induced on the hospital bed.  A foley catheter was inserted. The patient was then positioned on the Hana table.  All bony prominences were well padded.  The hip was prepped and draped in the normal sterile surgical fashion.  A time-out was called verifying side and site of surgery. Antibiotics were given within 60 minutes of beginning the procedure.   Bikini incision was made, and the direct anterior approach to the hip was performed through the Hueter interval.  Superficial dissection was carried out lateral to the ASIS. Lateral femoral circumflex vessels were treated with the Auqumantys. The anterior capsule was exposed and an inverted T capsulotomy was made. Fracture hematoma was encountered and evacuated. The patient was found to have a comminuted Left subcapital femoral neck fracture.  Inferior pubofemoral ligament was released subperiosteally to the lesser  trochanter. I freshened the femoral neck cut with a saw.  I removed the femoral neck fragment.  A corkscrew was placed into the head and the head was removed.  This was passed to the back table and was measured.   Acetabular exposure was achieved.  I examined the articular cartilage which was intact.  The labrum was intact. A 41 mm trial head was placed and found to have excellent fit.   I then gained femoral exposure taking care to protect the abductors and greater trochanter.  The superior capsule was incised longitudinally, staying lateral to the posterior border of the femoral neck. External rotation, extension, and adduction were applied.  A cookie cutter was used to enter the femoral canal, and then the femoral canal finder was used to confirm location.  I then sequentially broached up to a size 9.  Calcar planer was used on the femoral neck remnant.  I placed a high offset neck and a bipolar trial head ball. The hip was reduced.  Leg lengths were checked fluoroscopically.  The hip was dislocated and trial components were removed.  I placed the real stem followed by the real bipolar construct.  A single reduction maneuver was performed and the hip was reduced.  Fluoroscopy was used to confirm component position and leg lengths.  At 90 degrees of external rotation and extension, the hip was stable to an anterior directed force.   The wound was copiously irrigated with Irrisept solution and normal saline using pulse lavage.  Marcaine solution was injected into the periarticular soft tissue.  The wound was closed in layers using #1 Stratafix for the fascia, 2-0 Vicryl for the subcutaneous fat, 2-0 Monocryl for the  deep dermal layer, and staples + Dermabond for the skin.  Once the glue was fully dried, an Aquacell Ag dressing was applied.  The patient was then awakened from anesthesia and transported to the recovery room in stable condition.  Sponge, needle, and instrument counts were correct at the end of  the case x2.  The patient tolerated the procedure well and there were no known complications.  Please note that a surgical assistant was a medical necessity for this procedure to perform it in a safe and expeditious manner. Assistant was necessary to provide appropriate retraction of vital neurovascular structures, to prevent femoral fracture, and to allow for anatomic placement of the prosthesis.

## 2021-08-07 NOTE — Anesthesia Procedure Notes (Signed)
Procedure Name: Intubation Date/Time: 08/07/2021 8:00 AM  Performed by: Lavina Hamman, CRNAPre-anesthesia Checklist: Patient identified, Emergency Drugs available, Suction available, Patient being monitored and Timeout performed Patient Re-evaluated:Patient Re-evaluated prior to induction Oxygen Delivery Method: Circle system utilized Preoxygenation: Pre-oxygenation with 100% oxygen Induction Type: IV induction Ventilation: Mask ventilation without difficulty Laryngoscope Size: Mac and 3 Grade View: Grade I Tube type: Oral Tube size: 7.0 mm Number of attempts: 1 Airway Equipment and Method: Stylet Placement Confirmation: ETT inserted through vocal cords under direct vision, positive ETCO2, CO2 detector and breath sounds checked- equal and bilateral Secured at: 20 cm Tube secured with: Tape Dental Injury: Teeth and Oropharynx as per pre-operative assessment  Comments: ATOI

## 2021-08-07 NOTE — Transfer of Care (Signed)
Immediate Anesthesia Transfer of Care Note  Patient: Mackenzie Peterson  Procedure(s) Performed: Procedure(s): ANTERIOR APPROACH HEMI HIP ARTHROPLASTY (Left)  Patient Location: PACU  Anesthesia Type:General  Level of Consciousness:  sedated, patient cooperative and responds to stimulation  Airway & Oxygen Therapy:Patient Spontanous Breathing and Patient connected to face mask oxgen  Post-op Assessment:  Report given to PACU RN and Post -op Vital signs reviewed and stable  Post vital signs:  Reviewed and stable  Last Vitals:  Vitals:   08/07/21 0545 08/07/21 0615  BP: (!) 135/48 (!) 135/51  Pulse:  77  Resp:  15  Temp:    SpO2:  94%    Complications: No apparent anesthesia complications

## 2021-08-07 NOTE — Anesthesia Postprocedure Evaluation (Signed)
Anesthesia Post Note  Patient: Agricultural consultant  Procedure(s) Performed: ANTERIOR APPROACH HEMI HIP ARTHROPLASTY (Left: Hip)     Patient location during evaluation: PACU Anesthesia Type: General Level of consciousness: awake Pain management: pain level controlled Vital Signs Assessment: post-procedure vital signs reviewed and stable Respiratory status: spontaneous breathing, nonlabored ventilation and respiratory function stable Cardiovascular status: blood pressure returned to baseline and stable Postop Assessment: no apparent nausea or vomiting Anesthetic complications: no   No notable events documented.  Last Vitals:  Vitals:   08/07/21 0945 08/07/21 1000  BP: (!) 121/54 (!) 125/47  Pulse: 61 (!) 59  Resp: 16 13  Temp:  (!) 36.3 C  SpO2: 96% 98%    Last Pain:  Vitals:   08/07/21 1000  PainSc: 0-No pain                 Stephonie Wilcoxen A.

## 2021-08-08 DIAGNOSIS — S72002A Fracture of unspecified part of neck of left femur, initial encounter for closed fracture: Secondary | ICD-10-CM | POA: Diagnosis not present

## 2021-08-08 DIAGNOSIS — E860 Dehydration: Secondary | ICD-10-CM

## 2021-08-08 DIAGNOSIS — F419 Anxiety disorder, unspecified: Secondary | ICD-10-CM | POA: Diagnosis not present

## 2021-08-08 DIAGNOSIS — J449 Chronic obstructive pulmonary disease, unspecified: Secondary | ICD-10-CM | POA: Diagnosis not present

## 2021-08-08 DIAGNOSIS — D62 Acute posthemorrhagic anemia: Secondary | ICD-10-CM

## 2021-08-08 DIAGNOSIS — J9611 Chronic respiratory failure with hypoxia: Secondary | ICD-10-CM | POA: Diagnosis not present

## 2021-08-08 LAB — CBC
HCT: 35.7 % — ABNORMAL LOW (ref 36.0–46.0)
Hemoglobin: 11.7 g/dL — ABNORMAL LOW (ref 12.0–15.0)
MCH: 31.6 pg (ref 26.0–34.0)
MCHC: 32.8 g/dL (ref 30.0–36.0)
MCV: 96.5 fL (ref 80.0–100.0)
Platelets: 137 10*3/uL — ABNORMAL LOW (ref 150–400)
RBC: 3.7 MIL/uL — ABNORMAL LOW (ref 3.87–5.11)
RDW: 13.4 % (ref 11.5–15.5)
WBC: 10.5 10*3/uL (ref 4.0–10.5)
nRBC: 0 % (ref 0.0–0.2)

## 2021-08-08 LAB — BASIC METABOLIC PANEL
Anion gap: 5 (ref 5–15)
BUN: 18 mg/dL (ref 8–23)
CO2: 26 mmol/L (ref 22–32)
Calcium: 7.4 mg/dL — ABNORMAL LOW (ref 8.9–10.3)
Chloride: 110 mmol/L (ref 98–111)
Creatinine, Ser: 0.68 mg/dL (ref 0.44–1.00)
GFR, Estimated: >60 mL/min (ref >60)
Glucose, Bld: 105 mg/dL — ABNORMAL HIGH (ref 70–99)
Potassium: 4.2 mmol/L (ref 3.5–5.1)
Sodium: 141 mmol/L (ref 135–145)

## 2021-08-08 MED ORDER — HYDROCODONE-ACETAMINOPHEN 10-325 MG PO TABS: 0.5000 | ORAL_TABLET | ORAL | 0 refills | Status: AC | PRN

## 2021-08-08 MED ORDER — ASPIRIN 81 MG PO CHEW: 81.0000 mg | CHEWABLE_TABLET | Freq: Two times a day (BID) | ORAL | 0 refills | Status: AC

## 2021-08-08 MED ORDER — CHLORHEXIDINE GLUCONATE CLOTH 2 % EX PADS
6.0000 | MEDICATED_PAD | Freq: Every day | CUTANEOUS | Status: DC
Start: 2021-08-08 — End: 2021-08-09
  Administered 2021-08-08 – 2021-08-09 (×2): 6 via TOPICAL

## 2021-08-08 NOTE — NC FL2 (Signed)
[place] MEDICAID FL2 LEVEL OF CARE SCREENING TOOL     IDENTIFICATION  Patient Name: Mackenzie Peterson Birthdate: [birth year] Sex: female Admission Date (Current Location): 08/06/2021  [facility] and [place] Number:  Producer, television/film/video and Address:  [facility],  [address] [place], [place] [address]      Provider Number: [id number]  Attending Physician Name and Address:  [place], Alvira Philips, DO  Relative Name and Phone Number:  Vianne Bulls (daughter in law) Ph: [phone]    Current Level of Care: Hospital Recommended Level of Care: Skilled Nursing Facility Prior Approval Number:    Date Approved/Denied:   PASRR Number: [id number]  Discharge Plan: SNF    Current Diagnoses: Patient Active Problem List   Diagnosis Date Noted   Acute postoperative anemia due to expected blood loss 08/08/2021   Closed left hip fracture (HCC) 08/06/2021   Dehydration 08/06/2021   Fall at home, initial encounter 08/06/2021   COPD (chronic obstructive pulmonary disease) (HCC) 08/06/2021   HLD (hyperlipidemia) 08/06/2021   Anxiety 08/06/2021   Intertrochanteric fracture of right femur (HCC) 11/28/2019   Closed right hip fracture, initial encounter (HCC) 11/28/2019   Delirium 11/26/2019   Chronic respiratory failure with hypoxia (HCC) 01/16/2012   Dementia (HCC) 01/06/2012   Pneumonia 01/06/2012   Leukocytosis 01/05/2012   Abdominal pain 01/05/2012   Hypothyroidism 10/26/2009   Depression 10/26/2009   Mild persistent chronic asthma without complication 10/26/2009   SHORTNESS OF BREATH (SOB) 10/26/2009   COUGH 10/26/2009    Orientation RESPIRATION BLADDER Height & Weight     Self  Normal Continent Weight: 111 lb 15.9 oz (50.8 kg) Height:  5' (152.4 cm)  BEHAVIORAL SYMPTOMS/MOOD NEUROLOGICAL BOWEL NUTRITION STATUS   (N/A)  (N/A) Continent Diet (Heart healthy)  AMBULATORY STATUS COMMUNICATION OF NEEDS Skin   Extensive Assist Verbally Surgical wounds, Other (Comment)  (Ecchymosis: bilateral arms)                       Personal Care Assistance Level of Assistance  Bathing, Feeding, Dressing Bathing Assistance: Maximum assistance Feeding assistance: Limited assistance Dressing Assistance: Maximum assistance     Functional Limitations Info  Sight, Hearing, Speech Sight Info: Adequate Hearing Info: Adequate Speech Info: Adequate    SPECIAL CARE FACTORS FREQUENCY  PT (By licensed PT), OT (By licensed OT)     PT Frequency: 5x's/week OT Frequency: 5x's/week            Contractures Contractures Info: Not present    Additional Factors Info  Code Status, Allergies, Psychotropic Code Status Info: Full Allergies Info: NKA Psychotropic Info: Seroquel, Zoloft         Current Medications (08/08/2021):  This is the current hospital active medication list Current Facility-Administered Medications  Medication Dose Route Frequency Provider Last Rate Last Admin   0.9 %  sodium chloride infusion   Intravenous Continuous Swinteck, Arlys John, MD   Stopped at 08/08/21 0954   acetaminophen (TYLENOL) tablet 325-650 mg  325-650 mg Oral Q6H PRN Swinteck, Arlys John, MD       albuterol (PROVENTIL) (2.5 MG/3ML) 0.083% nebulizer solution 2.5 mg  2.5 mg Nebulization TID PRN Samson Frederic, MD       ALPRAZolam Prudy Feeler) tablet 0.5 mg  0.5 mg Oral BID PRN Samson Frederic, MD   0.5 mg at 08/07/21 2302   aspirin chewable tablet 81 mg  81 mg Oral BID WC Samson Frederic, MD   81 mg at 08/08/21 1033   celecoxib (CELEBREX) capsule 200  mg  200 mg Oral BID Samson Frederic, MD   200 mg at 08/08/21 1033   Chlorhexidine Gluconate Cloth 2 % PADS 6 each  6 each Topical Daily Swinteck, Arlys John, MD   6 each at 08/08/21 1036   darifenacin (ENABLEX) 24 hr tablet 7.5 mg  7.5 mg Oral Daily Swinteck, Arlys John, MD   7.5 mg at 08/08/21 1033   docusate sodium (COLACE) capsule 100 mg  100 mg Oral BID Samson Frederic, MD   100 mg at 08/08/21 1033   donepezil (ARICEPT) tablet 10 mg  10 mg Oral QHS  Samson Frederic, MD   10 mg at 08/07/21 2303   HYDROcodone-acetaminophen (NORCO) 7.5-325 MG per tablet 1-2 tablet  1-2 tablet Oral Q4H PRN Samson Frederic, MD       HYDROcodone-acetaminophen (NORCO/VICODIN) 5-325 MG per tablet 1-2 tablet  1-2 tablet Oral Q4H PRN Samson Frederic, MD   1 tablet at 08/08/21 0554   levothyroxine (SYNTHROID) tablet 25 mcg  25 mcg Oral Q0600 Samson Frederic, MD   25 mcg at 08/08/21 0612   loratadine (CLARITIN) tablet 10 mg  10 mg Oral Daily Swinteck, Arlys John, MD   10 mg at 08/08/21 1033   memantine (NAMENDA) tablet 10 mg  10 mg Oral BID Samson Frederic, MD   10 mg at 08/08/21 1033   menthol-cetylpyridinium (CEPACOL) lozenge 3 mg  1 lozenge Oral PRN Swinteck, Arlys John, MD       Or   phenol (CHLORASEPTIC) mouth spray 1 spray  1 spray Mouth/Throat PRN Swinteck, Arlys John, MD       methocarbamol (ROBAXIN) tablet 500 mg  500 mg Oral Q6H PRN Swinteck, Arlys John, MD       Or   methocarbamol (ROBAXIN) 500 mg in dextrose 5 % 50 mL IVPB  500 mg Intravenous Q6H PRN Swinteck, Arlys John, MD       metoCLOPramide (REGLAN) tablet 5-10 mg  5-10 mg Oral Q8H PRN Swinteck, Arlys John, MD       Or   metoCLOPramide (REGLAN) injection 5-10 mg  5-10 mg Intravenous Q8H PRN Swinteck, Arlys John, MD       morphine (PF) 2 MG/ML injection 0.5-1 mg  0.5-1 mg Intravenous Q2H PRN Swinteck, Arlys John, MD       ondansetron [facility]) tablet 4 mg  4 mg Oral Q6H PRN Swinteck, Arlys John, MD       Or   ondansetron (ZOFRAN) injection 4 mg  4 mg Intravenous Q6H PRN Swinteck, Arlys John, MD       pantoprazole (PROTONIX) EC tablet 80 mg  80 mg Oral Daily Swinteck, Arlys John, MD   80 mg at 08/08/21 1033   polyethylene glycol (MIRALAX / GLYCOLAX) packet 17 g  17 g Oral Daily PRN Swinteck, Arlys John, MD       pravastatin (PRAVACHOL) tablet 20 mg  20 mg Oral QHS Samson Frederic, MD   20 mg at 08/07/21 2303   QUEtiapine (SEROQUEL) tablet 100 mg  100 mg Oral BID Samson Frederic, MD   100 mg at 08/08/21 1033   senna (SENOKOT) tablet 8.6 mg  1 tablet Oral BID  Samson Frederic, MD   8.6 mg at 08/08/21 1033   sertraline (ZOLOFT) tablet 25 mg  25 mg Oral QHS Samson Frederic, MD   25 mg at 08/07/21 2302     Discharge Medications: Please see discharge summary for a list of discharge medications.  Relevant Imaging Results:  Relevant Lab Results:   Additional Information SSN: [id number]  Ewing Schlein, LCSW

## 2021-08-08 NOTE — Evaluation (Signed)
Occupational Therapy Evaluation Patient Details Name: Mackenzie Peterson MRN: [id number] DOB: 05-24-1924 Today's Date: 08/08/2021   History of Present Illness Pt is a 88yo female presenting s/p L-hip hemiarthroplasty, anterior approach on 7/9 after a fall on 7/8.  PMH: Dementia, asthma, COPD on 2L at baseline, HLD, HTN, hypothroidism, PVD, R-IM nail 2021   Clinical Impression   Patient is a 88 year old female who was admitted for above. Patient reported living at home with family but with patients h/o dementia and decreased ability to hear translator it was difficult to attempt to obtain PLOF. No family present at this time. Patient was noted to have decreased functional activity tolerance, decreased endurance, decreased standing balance, decreased safety awareness, and decreased knowledge of AD/AE impacting participation in ADLs. Patient would continue to benefit from skilled OT services at this time while admitted and after d/c to address noted deficits in order to improve overall safety and independence in ADLs.        Recommendations for follow up therapy are one component of a multi-disciplinary discharge planning process, led by the attending physician.  Recommendations may be updated based on patient status, additional functional criteria and insurance authorization.   Follow Up Recommendations  Skilled nursing-short term rehab (<3 hours/day)    Assistance Recommended at Discharge Frequent or constant Supervision/Assistance  Patient can return home with the following A lot of help with walking and/or transfers;A lot of help with bathing/dressing/bathroom;Assistance with cooking/housework;Direct supervision/assist for financial management;Assist for transportation;Help with stairs or ramp for entrance;Direct supervision/assist for medications management    Functional Status Assessment  Patient has had a recent decline in their functional status and demonstrates the ability to make significant  improvements in function in a reasonable and predictable amount of time.  Equipment Recommendations  Other (comment) (TBD)    Recommendations for Other Services       Precautions / Restrictions Precautions Precautions: Fall Restrictions Weight Bearing Restrictions: No LLE Weight Bearing: Weight bearing as tolerated      Mobility Bed Mobility Overal bed mobility: Needs Assistance Bed Mobility: Sit to Supine       Sit to supine: Min assist   General bed mobility comments: to get BLE into bed    Transfers                          Balance Overall balance assessment: Needs assistance Sitting-balance support: Feet supported, No upper extremity supported Sitting balance-Leahy Scale: Good     Standing balance support: Reliant on assistive device for balance, During functional activity, Bilateral upper extremity supported Standing balance-Leahy Scale: Poor                             ADL either performed or assessed with clinical judgement   ADL Overall ADL's : Needs assistance/impaired Eating/Feeding: Set up;Sitting   Grooming: Set up;Sitting   Upper Body Bathing: Minimal assistance;Sitting   Lower Body Bathing: Maximal assistance;Sit to/from stand;Sitting/lateral leans Lower Body Bathing Details (indicate cue type and reason): patient reported pain in bilateral legs with L hip hurting more. Upper Body Dressing : Minimal assistance;Sitting   Lower Body Dressing: Maximal assistance;Sit to/from stand;Sitting/lateral leans Lower Body Dressing Details (indicate cue type and reason): patient is reliant on walker for mobility with simulated standing Toilet Transfer: Minimal assistance;Rolling walker (2 wheels);Ambulation Toilet Transfer Details (indicate cue type and reason): patient declined to use bathroom at this time. patient was able  to transfer from reclienr to bed with RW with min A with increased time and cues for keeping walker close through  interpreter. Toileting- Clothing Manipulation and Hygiene: Maximal assistance;Sit to/from stand               Vision   Additional Comments: difficult time to assess with patient having a hard time hearing interpreter     Perception     Praxis      Pertinent Vitals/Pain Pain Assessment Pain Assessment: Faces Faces Pain Scale: Hurts even more Pain Location: left hip Pain Descriptors / Indicators: Discomfort, Grimacing, Operative site guarding Pain Intervention(s): Monitored during session, Repositioned     Hand Dominance     Extremity/Trunk Assessment Upper Extremity Assessment Upper Extremity Assessment: Overall WFL for tasks assessed   Lower Extremity Assessment Lower Extremity Assessment: Defer to PT evaluation   Cervical / Trunk Assessment Cervical / Trunk Assessment: Kyphotic   Communication Communication Communication: Prefers language other than English (spanish)   Cognition Arousal/Alertness: Awake/alert Behavior During Therapy: WFL for tasks assessed/performed Overall Cognitive Status: No family/caregiver present to determine baseline cognitive functioning                                 General Comments: Pt able to follow commands consistently. patient noted to be [facility] with interpreter used.     General Comments       Exercises     Shoulder Instructions      Home Living Family/patient expects to be discharged to:: Skilled nursing facility Living Arrangements: Children                               Additional Comments: patient is unable to provide PLOF at this time      Prior Functioning/Environment Prior Level of Function : Patient poor historian/Family not available               ADLs Comments: Pt reports ind, history of dementia        OT Problem List: Decreased activity tolerance;Impaired balance (sitting and/or standing);Decreased safety awareness;Pain;Decreased knowledge of precautions;Decreased  knowledge of use of DME or AE      OT Treatment/Interventions: Self-care/ADL training;Therapeutic exercise;Energy conservation;Neuromuscular education;DME and/or AE instruction;Therapeutic activities;Balance training;Patient/family education    OT Goals(Current goals can be found in the care plan section) Acute Rehab OT Goals Patient Stated Goal: none stated OT Goal Formulation: Patient unable to participate in goal setting Time For Goal Achievement: 08/22/21 Potential to Achieve Goals: Fair  OT Frequency: Min 2X/week    Co-evaluation              AM-PAC OT "6 Clicks" Daily Activity     Outcome Measure Help from another person eating meals?: A Little Help from another person taking care of personal grooming?: A Little Help from another person toileting, which includes using toliet, bedpan, or urinal?: A Lot Help from another person bathing (including washing, rinsing, drying)?: A Lot Help from another person to put on and taking off regular upper body clothing?: A Little Help from another person to put on and taking off regular lower body clothing?: A Lot 6 Click Score: 15   End of Session Equipment Utilized During Treatment: Gait belt;Rolling walker (2 wheels) Nurse Communication: Patient requests pain meds  Activity Tolerance: Patient limited by pain Patient left: in bed;with call bell/phone within reach;with bed alarm set  OT Visit Diagnosis: Unsteadiness on feet (R26.81);Other abnormalities of gait and mobility (R26.89);Muscle weakness (generalized) (M62.81);History of falling (Z91.81)                Time: 1455-1510 OT Time Calculation (min): 15 min Charges:  OT General Charges $OT Visit: 1 Visit OT Evaluation $OT Eval Moderate Complexity: 1 Mod  Sharyn Blitz OTR/L, MS Acute Rehabilitation Department Office# [phone] Pager# [phone]   Ardyth Harps 08/08/2021, 3:26 PM

## 2021-08-08 NOTE — TOC Initial Note (Signed)
Transition of Care [facility]) - Initial/Assessment Note   Patient Details  Name: Mackenzie Peterson MRN: [id number] Date of Birth: [birth year]  Transition of Care [facility]) CM/SW Contact:    Ewing Schlein, LCSW Phone Number: 08/08/2021, 2:37 PM  Clinical Narrative: PT evaluation recommended SNF and family is agreeable. FL2 done; PASRR verified. Initial referral faxed out. TOC awaiting bed offers.  Expected Discharge Plan: Skilled Nursing Facility Barriers to Discharge: Continued Medical Work up, SNF Pending bed offer  Patient Goals and CMS Choice Patient states their goals for this hospitalization and ongoing recovery are:: Go to rehab before returning home CMS Medicare.gov Compare Post Acute Care list provided to:: Patient Represenative (must comment) Choice offered to / list presented to : Adult Children  Expected Discharge Plan and Services Expected Discharge Plan: Skilled Nursing Facility In-house Referral: Clinical Social Work Post Acute Care Choice: Skilled Nursing Facility Living arrangements for the past 2 months: Single Family Home             DME Arranged: N/A DME Agency: NA  Prior Living Arrangements/Services Living arrangements for the past 2 months: Single Family Home Lives with:: Adult Children Patient language and need for interpreter reviewed:: Yes Do you feel safe going back to the place where you live?: Yes      Need for Family Participation in Patient Care: Yes (Comment) (Patient has dementia.) Care giver support system in place?: Yes (comment) Criminal Activity/Legal Involvement Pertinent to Current Situation/Hospitalization: No - Comment as needed  Activities of Daily Living Home Assistive Devices/Equipment: None ADL Screening (condition at time of admission) Patient's cognitive ability adequate to safely complete daily activities?: Yes Is the patient deaf or have difficulty hearing?: Yes Does the patient have difficulty seeing, even when wearing glasses/contacts?:  No Does the patient have difficulty concentrating, remembering, or making decisions?: No Patient able to express need for assistance with ADLs?: No Does the patient have difficulty dressing or bathing?: No Independently performs ADLs?: No Does the patient have difficulty walking or climbing stairs?: No Weakness of Legs: None Weakness of Arms/Hands: None  Permission Sought/Granted Permission sought to share information with : Facility Industrial/product designer granted to share information with : Yes, Verbal Permission Granted Permission granted to share info w AGENCY: SNFs  Emotional Assessment Orientation: : Oriented to Self Alcohol / Substance Use: Not Applicable  Admission diagnosis:  Closed left hip fracture (HCC) [S72.002A] Closed fracture of left hip, initial encounter (HCC) [S72.002A] Patient Active Problem List   Diagnosis Date Noted   Acute postoperative anemia due to expected blood loss 08/08/2021   Closed left hip fracture (HCC) 08/06/2021   Dehydration 08/06/2021   Fall at home, initial encounter 08/06/2021   COPD (chronic obstructive pulmonary disease) (HCC) 08/06/2021   HLD (hyperlipidemia) 08/06/2021   Anxiety 08/06/2021   Intertrochanteric fracture of right femur (HCC) 11/28/2019   Closed right hip fracture, initial encounter (HCC) 11/28/2019   Delirium 11/26/2019   Chronic respiratory failure with hypoxia (HCC) 01/16/2012   Dementia (HCC) 01/06/2012   Pneumonia 01/06/2012   Leukocytosis 01/05/2012   Abdominal pain 01/05/2012   Hypothyroidism 10/26/2009   Depression 10/26/2009   Mild persistent chronic asthma without complication 10/26/2009   SHORTNESS OF BREATH (SOB) 10/26/2009   COUGH 10/26/2009   PCP:  [name], MD Pharmacy:   [id number] - [place], [place] - [address] [place] [address] Phone: [phone] Fax: [phone]  Readmission Risk  Interventions     No data to display

## 2021-08-08 NOTE — Progress Notes (Addendum)
PROGRESS NOTE    Mackenzie Peterson  NWG:[id number] DOB: 12-02-1924 DOA: 08/06/2021 PCP: [name], MD    Brief Narrative:   Mackenzie Peterson is a 88 y.o. female with past medical history significant for COPD/chronic hypoxic respiratory failure on 2 L [place] at baseline, dementia, essential hypertension, hypothyroidism, hyperlipidemia who presented to [facility] ED via EMS following fall at home.  History obtained from daughter due to patient's underlying dementia.  Patient was apparently trying to move from the kitchen to the table last night and fell onto her left side.  She did not hit her head and no loss of consciousness reported.  Patient was adamant about not getting checked out overnight but the following morning she started developing significant left hip pain and unable to ambulate.  Patient denies chest pain, palpitations, no lightheadedness/dizziness.  Given her significant pain and inability to ambulate, patient was brought to the ED for further evaluation.  In the ED, temperature 97.6 F, HR 75, RR 16, BP 136/78, SPO2 100% on room air.  WBC count 11.6, hemoglobin 16.0, platelets 190.  Sodium 138, potassium 3.8, chloride 104, CO2 27, glucose 127, BUN 27, creatinine 0.73.  Left hip/pelvis with transcervical left femoral neck fracture with varus angulation.  Left knee x-ray with no acute fracture/dislocation.  Orthopedics was consulted.  TRH consulted for further evaluation and management of left hip fracture.  Assessment & Plan:    Displaced left femoral neck fracture Patient presenting to ED following mechanical fall at home without loss of consciousness.  Unable to ambulate with significant left hip pain.  Left hip/pelvis x-ray on arrival with transcervical left femoral neck fracture with varus angulation.  Orthopedics was consulted and patient underwent anterior approach left hip hemiarthroplasty by Dr. Linna Caprice on 08/07/2021. --Norco PO q4h PRN moderate pain --Morphine q2h PRN severe  pain --Robaxin as needed muscle spasms --Aspirin 81 mg p.o. twice daily for DVT prophylaxis per orthopedics --PT/OT evaluation: Pending, anticipate need of SNF placement  Postoperative blood loss anemia --Hgb 15.0>11.7 --CBC daily --Transfuse for hemoglobin less than 7.0  Essential hypertension Currently not on any antihypertensives outpatient.  BP 135/51 this morning. --Continue monitor BP closely  COPD Chronic hypoxic respiratory failure on home oxygen, 2L [place] baseline --Albuterol neb as needed shortness of breath/wheezing --Continue home supplemental oxygen at 2 L nasal cannula   Hyperlipidemia: Continue pravastatin 20 mg p.o. daily  Dementia --Delirium precautions --Get up during the day --Encourage a familiar face to remain present throughout the day --Keep blinds open and lights on during daylight hours --Minimize the use of opioids/benzodiazepines --Namenda 10 mg p.o. twice daily --Donepezil 10 mg p.o. nightly --Seroquel 25 mg p.o. nightly  Hypothyroidism: Levothyroxine 25 mcg p.o. daily  GERD: Continue PPI  Anxiety/depression: --Sertraline 25 mg p.o. daily --Alprazolam 0.5 mg p.o. twice daily as needed anxiety  DVT prophylaxis: SCDs Start: 08/07/21 1019    Code Status: Full Code Family Communication: No family present at bedside this morning  Disposition Plan:  Level of care: Med-Surg Status is: Inpatient Remains inpatient appropriate because: Pending PT/OT evaluation; anticipate need for SNF placement.    Consultants:  Orthopedics, Dr. Linna Caprice  Procedures:  Left hip hemiarthroplasty, anterior approach; Dr. Linna Caprice 7/9  Antimicrobials:  Perioperative cefazolin   Subjective: Patient seen and examined at bedside, resting comfortably.  Aided in interpretation with video interpreter Debarah Crape [phone].  Patient pleasantly confused.  Denies pain.  No family present.  Awaiting PT/OT evaluation.  Discussed with RN at bedside, discontinue Foley catheter  today.  No acute concerns overnight per nursing staff.  Objective: Vitals:   08/08/21 0201 08/08/21 0555 08/08/21 0920 08/08/21 1030  BP: 118/66 (!) 105/57 (!) 118/49 (!) 96/49  Pulse: 64 71 66 74  Resp: 17 17 15 15   Temp: 97.6 F (36.4 C) 97.8 F (36.6 C) (!) 97.4 F (36.3 C) (!) 97.5 F (36.4 C)  TempSrc:  Oral Oral Oral  SpO2: 97% 94% 96% 100%  Weight:      Height:        Intake/Output Summary (Last 24 hours) at 08/08/2021 1155 Last data filed at 08/08/2021 1045 Gross per 24 hour  Intake 2680.73 ml  Output 600 ml  Net 2080.73 ml   Filed Weights   08/07/21 1107  Weight: 50.8 kg    Examination:  Physical Exam: GEN: NAD, alert; pleasantly confused, elderly in appearance HEENT: NCAT, PERRL, EOMI, sclera clear, MMM PULM: CTAB w/o wheezes/crackles, normal respiratory effort, on 2 L nasal cannula which is her baseline CV: RRR w/o M/G/R GI: abd soft, NTND, NABS, no R/G/M GU: Foley catheter noted draining clear yellow urine in collection bag. MSK: no peripheral edema, moving all extremities independently, left hip with surgical dressing in place, clean/dry/intact without erythema/fluctuance NEURO: No focal deficits Integumentary: dry/intact, no rashes or wounds    Data Reviewed: I have personally reviewed following labs and imaging studies  CBC: Recent Labs  Lab 08/06/21 1242 08/07/21 0300 08/08/21 0331  WBC 11.6* 10.7* 10.5  HGB 16.0* 15.0 11.7*  HCT 48.3* 45.8 35.7*  MCV 95.5 96.2 96.5  PLT 190 157 137*   Basic Metabolic Panel: Recent Labs  Lab 08/06/21 1242 08/07/21 0300 08/08/21 0331  NA 138 138 141  K 3.8 4.1 4.2  CL 104 107 110  CO2 27 24 26   GLUCOSE 127* 117* 105*  BUN 27* 20 18  CREATININE 0.73 0.56 0.68  CALCIUM 8.9 8.2* 7.4*   GFR: Estimated Creatinine Clearance: 30.9 mL/min (by C-G formula based on SCr of 0.68 mg/dL). Liver Function Tests: No results for input(s): "AST", "ALT", "ALKPHOS", "BILITOT", "PROT", "ALBUMIN" in the last 168  hours. No results for input(s): "LIPASE", "AMYLASE" in the last 168 hours. No results for input(s): "AMMONIA" in the last 168 hours. Coagulation Profile: No results for input(s): "INR", "PROTIME" in the last 168 hours. Cardiac Enzymes: No results for input(s): "CKTOTAL", "CKMB", "CKMBINDEX", "TROPONINI" in the last 168 hours. BNP (last 3 results) No results for input(s): "PROBNP" in the last 8760 hours. HbA1C: No results for input(s): "HGBA1C" in the last 72 hours. CBG: No results for input(s): "GLUCAP" in the last 168 hours. Lipid Profile: No results for input(s): "CHOL", "HDL", "LDLCALC", "TRIG", "CHOLHDL", "LDLDIRECT" in the last 72 hours. Thyroid Function Tests: No results for input(s): "TSH", "T4TOTAL", "FREET4", "T3FREE", "THYROIDAB" in the last 72 hours. Anemia Panel: No results for input(s): "VITAMINB12", "FOLATE", "FERRITIN", "TIBC", "IRON", "RETICCTPCT" in the last 72 hours. Sepsis Labs: No results for input(s): "PROCALCITON", "LATICACIDVEN" in the last 168 hours.  No results found for this or any previous visit (from the past 240 hour(s)).       Radiology Studies: Pelvis Portable  Result Date: 08/07/2021 CLINICAL DATA:  Postop left hemiarthroplasty. EXAM: PORTABLE PELVIS 1-2 VIEWS COMPARISON:  08/06/2021. FINDINGS: New left hip hemiarthroplasty appears well seated and aligned. Orthopedic hardware from a prior right proximal femur fracture ORIF is stable and well positioned. Postoperative air and soft tissue edema adjacent to the proximal left femur and left hemipelvis. IMPRESSION: 1. Well-positioned left proximal femur hemiarthroplasty. No  evidence of an operative complication. Electronically Signed   By: Amie Portland M.D.   On: 08/07/2021 10:31   DG HIP UNILAT WITH PELVIS 1V LEFT  Result Date: 08/07/2021 CLINICAL DATA:  Portable imaging from left hip hemiarthroplasty. EXAM: OPERATIVE LEFT HIP (WITH PELVIS IF PERFORMED) 2 VIEWS TECHNIQUE: Fluoroscopic spot image(s) were  submitted for interpretation post-operatively. COMPARISON:  08/06/2021. FINDINGS: Two submitted portable views show a left hip hemiarthroplasty, well seated and aligned. No evidence of a new fracture or operative complication. IMPRESSION: Well-positioned left hip hemiarthroplasty. Electronically Signed   By: Amie Portland M.D.   On: 08/07/2021 10:29   DG C-Arm 1-60 Min-No Report  Result Date: 08/07/2021 Fluoroscopy was utilized by the requesting physician.  No radiographic interpretation.   DG Knee Left Port  Result Date: 08/06/2021 CLINICAL DATA:  Left hip fracture, fall. EXAM: PORTABLE LEFT KNEE - 1-2 VIEW COMPARISON:  None Available. FINDINGS: No acute fracture or dislocation. Mild to moderate tricompartmental degenerative changes are noted. No joint effusion. The soft tissues are unremarkable. IMPRESSION: No acute fracture or dislocation. Electronically Signed   By: Thornell Sartorius M.D.   On: 08/06/2021 21:08   DG Hip Unilat With Pelvis 2-3 Views Left  Result Date: 08/06/2021 CLINICAL DATA:  Fall.  Left hip pain. EXAM: DG HIP (WITH OR WITHOUT PELVIS) 2-3V LEFT COMPARISON:  None Available. FINDINGS: Bones are diffusely demineralized. Status post ORIF for proximal right femur fracture. Right femoral intramedullary nail has been incompletely visualized. AP view of the left hip shows a transcervical femoral neck fracture with some cranial over riding of the distal fragment and varus angulation. This is largely obscured on the lateral projections. IMPRESSION: 1. Transcervical left femoral neck fracture with varus angulation. 2. Status post ORIF for proximal right femur fracture. 3. Diffuse bony demineralization. Electronically Signed   By: Kennith Center M.D.   On: 08/06/2021 13:55        Scheduled Meds:  aspirin  81 mg Oral BID WC   celecoxib  200 mg Oral BID   Chlorhexidine Gluconate Cloth  6 each Topical Daily   darifenacin  7.5 mg Oral Daily   docusate sodium  100 mg Oral BID   donepezil  10 mg  Oral QHS   levothyroxine  25 mcg Oral Q0600   loratadine  10 mg Oral Daily   memantine  10 mg Oral BID   pantoprazole  80 mg Oral Daily   pravastatin  20 mg Oral QHS   QUEtiapine  100 mg Oral BID   senna  1 tablet Oral BID   sertraline  25 mg Oral QHS   Continuous Infusions:  sodium chloride Stopped (08/08/21 0954)   methocarbamol (ROBAXIN) IV       LOS: 2 days    Time spent: 42 minutes spent on chart review, discussion with nursing staff, consultants, updating family and interview/physical exam; more than 50% of that time was spent in counseling and/or coordination of care.    Alvira Philips [place], DO [facility] Available via Epic secure chat 7am-7pm After these hours, please refer to coverage provider listed on [email or web address] 08/08/2021, 11:55 AM

## 2021-08-08 NOTE — Evaluation (Signed)
Physical Therapy Evaluation Patient Details Name: Mackenzie Peterson MRN: [id number] DOB: Jul 23, 1924 Today's Date: 08/08/2021  History of Present Illness  Pt is a 88yo female presenting s/p L-hip hemiarthroplasty, anterior approach on 7/9 after a fall on 7/8.  PMH: Dementia, asthma, COPD on 2L at baseline, HLD, HTN, hypothroidism, PVD, R-IM nail 2021.   Clinical Impression  Mackenzie Peterson is a 88 y.o. female POD1 s/p L hip hemiarthroplasty anterior approach secondary to a recent fall and related femur fx. Pt has history of dementia and is AO only to self so history, PLOF, and home environment limited. Patient is now limited by functional impairments (see PT problem list below) and requires mod assist for bed mobility and min assist for transfers; gait deferred secondary to pain and fatigue. Patient instructed in exercise to facilitate ROM and circulation to manage edema. Patient will benefit from continued skilled PT interventions to address impairments and progress towards PLOF. Acute PT will follow to progress mobility and prepare for safe discharge to the destination indicated below.         Recommendations for follow up therapy are one component of a multi-disciplinary discharge planning process, led by the attending physician.  Recommendations may be updated based on patient status, additional functional criteria and insurance authorization.  Follow Up Recommendations Skilled nursing-short term rehab (<3 hours/day) Can patient physically be transported by private vehicle: Yes (Have not confirmed status with family)    Assistance Recommended at Discharge Frequent or constant Supervision/Assistance  Patient can return home with the following  A lot of help with walking and/or transfers;A lot of help with bathing/dressing/bathroom;Assistance with cooking/housework;Assistance with feeding;Direct supervision/assist for medications management;Direct supervision/assist for financial management;Assist  for transportation;Help with stairs or ramp for entrance    Equipment Recommendations None recommended by PT (TBD)  Recommendations for Other Services       Functional Status Assessment Patient has had a recent decline in their functional status and demonstrates the ability to make significant improvements in function in a reasonable and predictable amount of time.     Precautions / Restrictions Precautions Precautions: Fall Restrictions Weight Bearing Restrictions: No LLE Weight Bearing: Weight bearing as tolerated      Mobility  Bed Mobility Overal bed mobility: Needs Assistance Bed Mobility: Supine to Sit     Supine to sit: Mod assist, HOB elevated     General bed mobility comments: For trunk elevation and scooting hips to EOB    Transfers Overall transfer level: Needs assistance Equipment used: Rolling walker (2 wheels) Transfers: Sit to/from Stand, Bed to chair/wheelchair/BSC Sit to Stand: Min assist, +2 safety/equipment   Step pivot transfers: Min assist, +2 safety/equipment       General transfer comment: Min assist for lift assist (completed transfer x3 to allow RN to inspect and then apply sacral bandage), min assist for steadying during step pivot transfer, no overt LOB noted.    Ambulation/Gait               General Gait Details: deferred  Stairs            Wheelchair Mobility    Modified Rankin (Stroke Patients Only)       Balance Overall balance assessment: Needs assistance Sitting-balance support: Feet supported, No upper extremity supported Sitting balance-Leahy Scale: Good     Standing balance support: Reliant on assistive device for balance, During functional activity, Bilateral upper extremity supported Standing balance-Leahy Scale: Poor  Pertinent Vitals/Pain Pain Assessment Pain Assessment: Faces Faces Pain Scale: Hurts even more Pain Location: left hip    Home Living  Family/patient expects to be discharged to:: Skilled nursing facility Living Arrangements: Children                 Additional Comments: Pt reports she lives in [place], [place] with her mother (pt with history of dementia).    Prior Function Prior Level of Function : Patient poor historian/Family not available             Mobility Comments: pt reports ind, pt with history of dementia ADLs Comments: Pt reports ind, history of dementia     Hand Dominance        Extremity/Trunk Assessment   Upper Extremity Assessment Upper Extremity Assessment: Overall WFL for tasks assessed    Lower Extremity Assessment Lower Extremity Assessment: Generalized weakness;RLE deficits/detail;LLE deficits/detail RLE Deficits / Details: MMT ank DF/PF 3+/5 RLE Sensation: WNL LLE Deficits / Details: MMT ank DF/PF 3+/5 LLE Sensation: decreased light touch    Cervical / Trunk Assessment Cervical / Trunk Assessment: Kyphotic  Communication   Communication: Prefers language other than English (Spanish)  Cognition Arousal/Alertness: Awake/alert Behavior During Therapy: WFL for tasks assessed/performed Overall Cognitive Status: No family/caregiver present to determine baseline cognitive functioning                                 General Comments: Pt able to follow commands consistently        General Comments      Exercises     Assessment/Plan    PT Assessment Patient needs continued PT services  PT Problem List Decreased strength;Decreased range of motion;Decreased activity tolerance;Decreased balance;Decreased mobility;Decreased coordination;Decreased cognition;Decreased knowledge of use of DME;Decreased safety awareness;Pain       PT Treatment Interventions DME instruction;Gait training;Stair training;Functional mobility training;Therapeutic activities;Therapeutic exercise;Balance training;Neuromuscular re-education;Cognitive remediation;Patient/family  education    PT Goals (Current goals can be found in the Care Plan section)  Acute Rehab PT Goals Patient Stated Goal: unable to state PT Goal Formulation: Patient unable to participate in goal setting Time For Goal Achievement: 08/22/21 Potential to Achieve Goals: Good    Frequency 7X/week     Co-evaluation               AM-PAC PT "6 Clicks" Mobility  Outcome Measure Help needed turning from your back to your side while in a flat bed without using bedrails?: A Little Help needed moving from lying on your back to sitting on the side of a flat bed without using bedrails?: A Lot Help needed moving to and from a bed to a chair (including a wheelchair)?: A Lot Help needed standing up from a chair using your arms (e.g., wheelchair or bedside chair)?: A Lot Help needed to walk in hospital room?: A Lot Help needed climbing 3-5 steps with a railing? : A Lot 6 Click Score: 13    End of Session Equipment Utilized During Treatment: Gait belt Activity Tolerance: Patient tolerated treatment well;No increased pain Patient left: in chair;with call bell/phone within reach;with chair alarm set;with SCD's reapplied Nurse Communication: Mobility status PT Visit Diagnosis: Pain;Difficulty in walking, not elsewhere classified (R26.2) Pain - Right/Left: Left Pain - part of body: Hip    Time: 3875-6433 PT Time Calculation (min) (ACUTE ONLY): 24 min   Charges:   PT Evaluation $PT Eval Low Complexity: 1 Low PT Treatments $Therapeutic Activity:  8-22 mins   Jamesetta Geralds, PT, DPT [facility] Rehabilitation Department Office: [phone] Pager: [phone]  Jamesetta Geralds 08/08/2021, 12:35 PM

## 2021-08-08 NOTE — Progress Notes (Addendum)
    Subjective:  Patient reports pain as mild to moderate.  Denies N/V/CP/SOB/Abd pain. She has dementia at baseline.   Objective:   VITALS:   Vitals:   08/07/21 1900 08/07/21 2143 08/08/21 0201 08/08/21 0555  BP: (!) 109/53 (!) 107/58 118/66 (!) 105/57  Pulse: 63 66 64 71  Resp: 16 17 17 17   Temp: 97.6 F (36.4 C) 97.6 F (36.4 C) 97.6 F (36.4 C) 97.8 F (36.6 C)  TempSrc: Oral Oral  Oral  SpO2: 95% 96% 97% 94%  Weight:      Height:        Patient lying comfortably in bed. NAD. She has dementia at baseline.  ABD soft Neurovascular intact Sensation intact distally Intact pulses distally Dorsiflexion/Plantar flexion intact Incision: dressing C/D/I No cellulitis present Compartment soft   Lab Results  Component Value Date   WBC 10.5 08/08/2021   HGB 11.7 (L) 08/08/2021   HCT 35.7 (L) 08/08/2021   MCV 96.5 08/08/2021   PLT 137 (L) 08/08/2021   BMET    Component Value Date/Time   NA 141 08/08/2021 0331   K 4.2 08/08/2021 0331   CL 110 08/08/2021 0331   CO2 26 08/08/2021 0331   GLUCOSE 105 (H) 08/08/2021 0331   BUN 18 08/08/2021 0331   CREATININE 0.68 08/08/2021 0331   CALCIUM 7.4 (L) 08/08/2021 0331   GFRNONAA >60 08/08/2021 0331     Assessment/Plan: 1 Day Post-Op   Principal Problem:   Closed left hip fracture (HCC) Active Problems:   Hypothyroidism   Depression   Leukocytosis   Dementia (HCC)   Chronic respiratory failure with hypoxia (HCC)   Dehydration   Fall at home, initial encounter   COPD (chronic obstructive pulmonary disease) (HCC)   HLD (hyperlipidemia)   Anxiety   WBAT with walker DVT ppx: Aspirin 81 mg BID, SCDs, TEDS PO pain control PT/OT: PT has not seen yet. PT to come by today.  Dispo: Patient under hospitalist care d/c per their recommendation. Pain medication and DVT ppx printed in chart.     10/09/2021, PA-C 08/08/2021, 7:37 AM  [facility]  [facility] [address], [address], [place], [place]  [place] Phone: [phone]

## 2021-08-08 NOTE — Plan of Care (Signed)
  Problem: Education: Goal: Knowledge of General Education information will improve Description: Including pain rating scale, medication(s)/side effects and non-pharmacologic comfort measures Outcome: Progressing   Problem: Health Behavior/Discharge Planning: Goal: Ability to manage health-related needs will improve Outcome: Progressing   Problem: Clinical Measurements: Goal: Ability to maintain clinical measurements within normal limits will improve Outcome: Progressing   Problem: Coping: Goal: Level of anxiety will decrease Outcome: Progressing   Problem: Elimination: Goal: Will not experience complications related to bowel motility Outcome: Progressing   Problem: Pain Managment: Goal: General experience of comfort will improve Outcome: Progressing   Problem: Safety: Goal: Ability to remain free from injury will improve Outcome: Progressing   Problem: Skin Integrity: Goal: Risk for impaired skin integrity will decrease Outcome: Progressing   

## 2021-08-09 ENCOUNTER — Encounter (HOSPITAL_COMMUNITY): Payer: Self-pay | Admitting: Orthopedic Surgery

## 2021-08-09 DIAGNOSIS — D62 Acute posthemorrhagic anemia: Secondary | ICD-10-CM | POA: Diagnosis not present

## 2021-08-09 DIAGNOSIS — J9611 Chronic respiratory failure with hypoxia: Secondary | ICD-10-CM | POA: Diagnosis not present

## 2021-08-09 DIAGNOSIS — S72002A Fracture of unspecified part of neck of left femur, initial encounter for closed fracture: Secondary | ICD-10-CM | POA: Diagnosis not present

## 2021-08-09 DIAGNOSIS — F419 Anxiety disorder, unspecified: Secondary | ICD-10-CM | POA: Diagnosis not present

## 2021-08-09 LAB — CBC
HCT: 35.4 % — ABNORMAL LOW (ref 36.0–46.0)
Hemoglobin: 11.4 g/dL — ABNORMAL LOW (ref 12.0–15.0)
MCH: 31.4 pg (ref 26.0–34.0)
MCHC: 32.2 g/dL (ref 30.0–36.0)
MCV: 97.5 fL (ref 80.0–100.0)
Platelets: 154 10*3/uL (ref 150–400)
RBC: 3.63 MIL/uL — ABNORMAL LOW (ref 3.87–5.11)
RDW: 13.6 % (ref 11.5–15.5)
WBC: 8.8 10*3/uL (ref 4.0–10.5)
nRBC: 0 % (ref 0.0–0.2)

## 2021-08-09 MED ORDER — SENNA 8.6 MG PO TABS: 1.0000 | ORAL_TABLET | Freq: Two times a day (BID) | ORAL | 0 refills | Status: DC

## 2021-08-09 MED ORDER — CELECOXIB 200 MG PO CAPS: 200.0000 mg | ORAL_CAPSULE | Freq: Two times a day (BID) | ORAL | 0 refills | Status: AC

## 2021-08-09 MED ORDER — DOCUSATE SODIUM 100 MG PO CAPS: 100.0000 mg | ORAL_CAPSULE | Freq: Two times a day (BID) | ORAL | 0 refills | Status: AC

## 2021-08-09 MED ORDER — POLYETHYLENE GLYCOL 3350 17 G PO PACK: 17.0000 g | PACK | Freq: Every day | ORAL | 0 refills | Status: DC | PRN

## 2021-08-09 MED ORDER — METHOCARBAMOL 500 MG PO TABS: 500.0000 mg | ORAL_TABLET | Freq: Four times a day (QID) | ORAL | Status: DC | PRN

## 2021-08-09 NOTE — Progress Notes (Signed)
Discharge instructions and medications reviewed with [facility] RN from [facility] at [phone]. All questions and concerns addressed "can patient understand and speak english". Patients belongings including upper dentures to be sent with patient. Mon[facility] RN made aware. Currently no s/sx of distress or discomfort noted.

## 2021-08-09 NOTE — Progress Notes (Signed)
    Subjective:  Interpreter service used. Steffany #[id number]. Patient reports pain as mild when lying in bed and moderate with movement.  Denies N/V/CP/SOB/Abd pain. Denies tingling and numbness in LE bilaterally.  Objective:   VITALS:   Vitals:   08/08/21 1030 08/08/21 1517 08/08/21 2113 08/09/21 0605  BP: (!) 96/49 (!) 124/48 (!) 118/50 (!) 134/53  Pulse: 74 75 73 68  Resp: 15 18 17 17   Temp: (!) 97.5 F (36.4 C) 97.6 F (36.4 C) 98.4 F (36.9 C) 97.7 F (36.5 C)  TempSrc: Oral Oral Oral Oral  SpO2: 100% 97% 99% 96%  Weight:      Height:        Patient sleeping when entering room. Woke her up for exam.  She is pleasantly confused this morning. NAD. ABD soft Neurovascular intact Sensation intact distally Intact pulses distally Dorsiflexion/Plantar flexion intact Incision: dressing C/D/I No cellulitis present Compartment soft Slight bruising just distal to aquacel dressing.   Lab Results  Component Value Date   WBC 8.8 08/09/2021   HGB 11.4 (L) 08/09/2021   HCT 35.4 (L) 08/09/2021   MCV 97.5 08/09/2021   PLT 154 08/09/2021   BMET    Component Value Date/Time   NA 141 08/08/2021 0331   K 4.2 08/08/2021 0331   CL 110 08/08/2021 0331   CO2 26 08/08/2021 0331   GLUCOSE 105 (H) 08/08/2021 0331   BUN 18 08/08/2021 0331   CREATININE 0.68 08/08/2021 0331   CALCIUM 7.4 (L) 08/08/2021 0331   GFRNONAA >60 08/08/2021 0331     Assessment/Plan: 2 Days Post-Op   Principal Problem:   Closed left hip fracture (HCC) Active Problems:   Hypothyroidism   Depression   Leukocytosis   Dementia (HCC)   Chronic respiratory failure with hypoxia (HCC)   Dehydration   Fall at home, initial encounter   COPD (chronic obstructive pulmonary disease) (HCC)   HLD (hyperlipidemia)   Anxiety   Acute postoperative anemia due to expected blood loss   WBAT with walker DVT ppx: Aspirin 81 mg BID, SCDs, TEDS PO pain control PT/OT: Pt worked with patient yesterday but did not  ambulate yet. She has exercise sheet at bedside. Continue to work with PT.  Dispo: Patient under hospitalist care d/c per their recommendation. Pain medication and DVT ppx printed in chart.    10/09/2021, PA-C 08/09/2021, 7:00 AM  [employer]  [facility] [address], [place], [place]

## 2021-08-09 NOTE — TOC Transition Note (Signed)
Transition of Care [facility]) - CM/SW Discharge Note  Patient Details  Name: Mackenzie Peterson MRN: [id number] Date of Birth: 23-Nov-1924  [facility]) CM/SW Contact:  Ewing Schlein, LCSW Phone Number: 08/09/2021, 12:28 PM  Clinical Narrative: Patient is medically stable for discharge to SNF. Daughter-in-law chose [facility] and is aware patient will start off in a semi-private room and will move to a private room once it becomes available. Patient will go to room 421 and the number for report is [phone]. Discharge summary, discharge orders, and SNF transfer report faxed to facility in hub.  Medical necessity form done; PTAR scheduled. Discharge packet completed. RN updated. TOC signing off.  Final next level of care: Skilled Nursing Facility Barriers to Discharge: Barriers Resolved  Patient Goals and CMS Choice Patient states their goals for this hospitalization and ongoing recovery are:: Go to rehab before returning home CMS Medicare.gov Compare Post Acute Care list provided to:: Patient Represenative (must comment) Choice offered to / list presented to : Adult Children  Discharge Placement Existing PASRR number confirmed : 08/08/21          Patient chooses bed at: [facility] Patient to be transferred to facility by: PTAR Name of family member notified: Vianne Bulls Patient and family notified of of transfer: 08/09/21  Discharge Plan and Services In-house Referral: Clinical Social Work Post Acute Care Choice: Skilled Nursing Facility          DME Arranged: N/A DME Agency: NA  Readmission Risk Interventions     No data to display

## 2021-08-09 NOTE — Discharge Summary (Signed)
Physician Discharge Summary  Mackenzie Peterson WPY:[id number] DOB: March 05, 1924 DOA: 08/06/2021  PCP: [name], MD  Admit date: 08/06/2021 Discharge date: 08/09/2021  Admitted From: Home Disposition: [facility]  Recommendations for Outpatient Follow-up:  Follow up with PCP in 1-2 weeks Follow-up with orthopedics, Dr. Linna Caprice in 2 weeks   Discharge Condition: Stable CODE STATUS: Full code Diet recommendation: Regular diet  History of present illness:  Mackenzie Peterson is a 88 y.o. female with past medical history significant for COPD/chronic hypoxic respiratory failure on 2 L [place] at baseline, dementia, essential hypertension, hypothyroidism, hyperlipidemia who presented to [facility] ED via EMS following fall at home.  History obtained from daughter due to patient's underlying dementia.  Patient was apparently trying to move from the kitchen to the table last night and fell onto her left side.  She did not hit her head and no loss of consciousness reported.  Patient was adamant about not getting checked out overnight but the following morning she started developing significant left hip pain and unable to ambulate.  Patient denies chest pain, palpitations, no lightheadedness/dizziness.  Given her significant pain and inability to ambulate, patient was brought to the ED for further evaluation.   In the ED, temperature 97.6 F, HR 75, RR 16, BP 136/78, SPO2 100% on room air.  WBC count 11.6, hemoglobin 16.0, platelets 190.  Sodium 138, potassium 3.8, chloride 104, CO2 27, glucose 127, BUN 27, creatinine 0.73.  Left hip/pelvis with transcervical left femoral neck fracture with varus angulation.  Left knee x-ray with no acute fracture/dislocation.  Orthopedics was consulted.  TRH consulted for further evaluation and management of left hip fracture.  Hospital course:  Displaced left femoral neck fracture Patient presenting to ED following mechanical fall at home without loss of consciousness.  Unable to  ambulate with significant left hip pain.  Left hip/pelvis x-ray on arrival with transcervical left femoral neck fracture with varus angulation. Orthopedics was consulted and patient underwent anterior approach left hip hemiarthroplasty by Dr. Linna Caprice on 08/07/2021.  Continue Norco as needed for pain control.  Robaxin as needed for muscle spasm.  Aspirin 81 mg p.o. twice daily for DVT prophylaxis postoperatively per orthopedics.  Discharging to SNF for further rehabilitation.  Outpatient follow-up with orthopedics, Dr. Linna Caprice in 2 weeks for suture removal.   Postoperative blood loss anemia Hemoglobin 15 on admission, dropped to 11.4 at time of discharge.  Recommend repeat CBC 1 week.   Essential hypertension Currently not on any antihypertensives outpatient, blood pressure currently controlled off antihypertensives.   COPD Chronic hypoxic respiratory failure on home oxygen, 2L [place] baseline Albuterol neb as needed shortness of breath/wheezing, Continue home supplemental oxygen at 2 L nasal cannula    Hyperlipidemia: Continue pravastatin 20 mg p.o. daily   Dementia Continue home Namenda 10 mg p.o. twice daily, Donepezil 10 mg p.o. nightly, Seroquel 25 mg p.o. nightly   Hypothyroidism: Levothyroxine 25 mcg p.o. daily   GERD: Continue PPI   Anxiety/depression: Continue home sertraline 25 mg p.o. daily, Alprazolam 0.5 mg p.o. twice daily as needed anxiety   Discharge Diagnoses:  Principal Problem:   Closed left hip fracture (HCC) Active Problems:   Leukocytosis   Hypothyroidism   Depression   Dementia (HCC)   Chronic respiratory failure with hypoxia (HCC)   Dehydration   Fall at home, initial encounter   COPD (chronic obstructive pulmonary disease) (HCC)   HLD (hyperlipidemia)   Anxiety   Acute postoperative anemia due to expected blood loss  Discharge Instructions  Discharge Instructions     Call MD for:  difficulty breathing, headache or visual disturbances   Complete by:  As directed    Call MD for:  extreme fatigue   Complete by: As directed    Call MD for:  persistant dizziness or light-headedness   Complete by: As directed    Call MD for:  persistant nausea and vomiting   Complete by: As directed    Call MD for:  severe uncontrolled pain   Complete by: As directed    Call MD for:  temperature >100.4   Complete by: As directed    Diet - low sodium heart healthy   Complete by: As directed    Increase activity slowly   Complete by: As directed    Leave dressing on - Keep it clean, dry, and intact until clinic visit   Complete by: As directed       Allergies as of 08/09/2021   No Known Allergies      Medication List     STOP taking these medications    aspirin 81 MG tablet Replaced by: aspirin 81 MG chewable tablet   enoxaparin 30 MG/0.3ML injection Commonly known as: LOVENOX   ibuprofen 200 MG tablet Commonly known as: ADVIL   levETIRAcetam 250 MG tablet Commonly known as: KEPPRA   traMADol 50 MG tablet Commonly known as: Ultram       TAKE these medications    acetaminophen 500 MG tablet Commonly known as: TYLENOL Take 500-1,000 mg by mouth every 6 (six) hours as needed for mild pain or headache.   albuterol (2.5 MG/3ML) 0.083% nebulizer solution Commonly known as: PROVENTIL Take 2.5 mg by nebulization 3 (three) times daily as needed for wheezing or shortness of breath.   ALPRAZolam 0.5 MG tablet Commonly known as: XANAX Take 1 tablet (0.5 mg total) by mouth 3 (three) times daily as needed for anxiety. What changed: when to take this   aspirin 81 MG chewable tablet Commonly known as: Aspirin Childrens Chew 1 tablet (81 mg total) by mouth 2 (two) times daily with a meal. Replaces: aspirin 81 MG tablet   celecoxib 200 MG capsule Commonly known as: CELEBREX Take 1 capsule (200 mg total) by mouth 2 (two) times daily.   cetirizine 10 MG tablet Commonly known as: ZYRTEC Take 10 mg by mouth at bedtime.   docusate  sodium 100 MG capsule Commonly known as: COLACE Take 1 capsule (100 mg total) by mouth 2 (two) times daily.   donepezil 10 MG tablet Commonly known as: ARICEPT Take 10 mg by mouth at bedtime.   HYDROcodone-acetaminophen 10-325 MG tablet Commonly known as: Norco Take 0.5 tablets by mouth every 4 (four) hours as needed for up to 7 days for moderate pain or severe pain.   levothyroxine 25 MCG tablet Commonly known as: SYNTHROID Take 25 mcg by mouth daily before breakfast.   memantine 10 MG tablet Commonly known as: NAMENDA Take 10 mg by mouth 2 (two) times daily.   methocarbamol 500 MG tablet Commonly known as: ROBAXIN Take 1 tablet (500 mg total) by mouth every 6 (six) hours as needed for muscle spasms.   nitroGLYCERIN 0.4 MG SL tablet Commonly known as: NITROSTAT Place 0.4 mg under the tongue every 5 (five) minutes as needed for chest pain.   omeprazole 20 MG capsule Commonly known as: PRILOSEC TAKE 1 CAPSULE BY MOUTH 30 TO 60 MINUTES BEFORE THE FIRST AND LAST MEALS OF THE DAY What changed:  See the new instructions.   One-A-Day Proactive 65+ Tabs Take 1 tablet by mouth daily with breakfast.   OXYGEN Inhale 2 L/min into the lungs at bedtime.   polyethylene glycol 17 g packet Commonly known as: MIRALAX / GLYCOLAX Take 17 g by mouth daily as needed for mild constipation.   pravastatin 20 MG tablet Commonly known as: PRAVACHOL Take 20 mg by mouth at bedtime.   QUEtiapine 100 MG tablet Commonly known as: SEROQUEL Take 100 mg by mouth 2 (two) times daily.   senna 8.6 MG Tabs tablet Commonly known as: SENOKOT Take 1 tablet (8.6 mg total) by mouth 2 (two) times daily.   sertraline 25 MG tablet Commonly known as: ZOLOFT Take 25 mg by mouth at bedtime.   solifenacin 5 MG tablet Commonly known as: VESICARE Take 5 mg by mouth at bedtime.   traZODone 50 MG tablet Commonly known as: DESYREL Take 50 mg by mouth at bedtime.               Discharge Care  Instructions  (From admission, onward)           Start     Ordered   08/09/21 0000  Leave dressing on - Keep it clean, dry, and intact until clinic visit        08/09/21 1130            Contact information for follow-up providers     Swinteck, Arlys John, MD. Schedule an appointment as soon as possible for a visit in 2 week(s).   Specialty: Orthopedic Surgery Why: For suture removal Contact information: [address] [place] [address] [phone]              Contact information for after-discharge care     Destination     [facility] Preferred SNF .   Service: Skilled Nursing Contact information: [address] [place] [address] [phone]                    No Known Allergies  Consultations: Orthopedics, Dr. Linna Caprice   Procedures/Studies: Pelvis Portable  Result Date: 08/07/2021 CLINICAL DATA:  Postop left hemiarthroplasty. EXAM: PORTABLE PELVIS 1-2 VIEWS COMPARISON:  08/06/2021. FINDINGS: New left hip hemiarthroplasty appears well seated and aligned. Orthopedic hardware from a prior right proximal femur fracture ORIF is stable and well positioned. Postoperative air and soft tissue edema adjacent to the proximal left femur and left hemipelvis. IMPRESSION: 1. Well-positioned left proximal femur hemiarthroplasty. No evidence of an operative complication. Electronically Signed   By: Amie Portland M.D.   On: 08/07/2021 10:31   DG C-Arm 1-60 Min-No Report  Result Date: 08/07/2021 CLINICAL DATA:  Portable imaging from left hip hemiarthroplasty. EXAM: OPERATIVE LEFT HIP (WITH PELVIS IF PERFORMED) 2 VIEWS TECHNIQUE: Fluoroscopic spot image(s) were submitted for interpretation post-operatively. COMPARISON:  08/06/2021. FINDINGS: Two submitted portable views show a left hip hemiarthroplasty, well seated and aligned. No evidence of a new fracture or operative complication. IMPRESSION: Well-positioned left hip  hemiarthroplasty. Electronically Signed   By: Amie Portland M.D.   On: 08/07/2021 10:29   DG HIP UNILAT WITH PELVIS 1V LEFT  Result Date: 08/07/2021 CLINICAL DATA:  Portable imaging from left hip hemiarthroplasty. EXAM: OPERATIVE LEFT HIP (WITH PELVIS IF PERFORMED) 2 VIEWS TECHNIQUE: Fluoroscopic spot image(s) were submitted for interpretation post-operatively. COMPARISON:  08/06/2021. FINDINGS: Two submitted portable views show a left hip hemiarthroplasty, well seated and aligned. No evidence of a new fracture  or operative complication. IMPRESSION: Well-positioned left hip hemiarthroplasty. Electronically Signed   By: Amie Portland M.D.   On: 08/07/2021 10:29   DG Knee Left Port  Result Date: 08/06/2021 CLINICAL DATA:  Left hip fracture, fall. EXAM: PORTABLE LEFT KNEE - 1-2 VIEW COMPARISON:  None Available. FINDINGS: No acute fracture or dislocation. Mild to moderate tricompartmental degenerative changes are noted. No joint effusion. The soft tissues are unremarkable. IMPRESSION: No acute fracture or dislocation. Electronically Signed   By: Thornell Sartorius M.D.   On: 08/06/2021 21:08   DG Hip Unilat With Pelvis 2-3 Views Left  Result Date: 08/06/2021 CLINICAL DATA:  Fall.  Left hip pain. EXAM: DG HIP (WITH OR WITHOUT PELVIS) 2-3V LEFT COMPARISON:  None Available. FINDINGS: Bones are diffusely demineralized. Status post ORIF for proximal right femur fracture. Right femoral intramedullary nail has been incompletely visualized. AP view of the left hip shows a transcervical femoral neck fracture with some cranial over riding of the distal fragment and varus angulation. This is largely obscured on the lateral projections. IMPRESSION: 1. Transcervical left femoral neck fracture with varus angulation. 2. Status post ORIF for proximal right femur fracture. 3. Diffuse bony demineralization. Electronically Signed   By: Kennith Center M.D.   On: 08/06/2021 13:55     Subjective: Patient seen examined bedside, resting  comfortably.  Pleasantly confused.  Aided in interpretation with video interpreter, although difficult given her underlying dementia.  Denies pain.  Discharging to SNF today for further rehabilitation.  No acute concerns overnight per nursing staff.  Discharge Exam: Vitals:   08/08/21 2113 08/09/21 0605  BP: (!) 118/50 (!) 134/53  Pulse: 73 68  Resp: 17 17  Temp: 98.4 F (36.9 C) 97.7 F (36.5 C)  SpO2: 99% 96%   Vitals:   08/08/21 1030 08/08/21 1517 08/08/21 2113 08/09/21 0605  BP: (!) 96/49 (!) 124/48 (!) 118/50 (!) 134/53  Pulse: 74 75 73 68  Resp: 15 18 17 17   Temp: (!) 97.5 F (36.4 C) 97.6 F (36.4 C) 98.4 F (36.9 C) 97.7 F (36.5 C)  TempSrc: Oral Oral Oral Oral  SpO2: 100% 97% 99% 96%  Weight:      Height:        Physical Exam: GEN: NAD, alert, pleasantly confused HEENT: NCAT, PERRL, EOMI, sclera clear, MMM PULM: CTAB w/o wheezes/crackles, normal respiratory effort, on 2 L nasal cannula which is her baseline CV: RRR w/o M/G/R GI: abd soft, NTND, NABS, no R/G/M MSK: no peripheral edema, moves all extremities independently, noted surgical dressing in place, clean/dry/intact NEURO: CN II-XII intact, no focal deficits Integumentary: Left hip with surgical dressing in place, clean/dry/intact without erythema/fluctuance; no other concerning rashes/lesions/wounds    The results of significant diagnostics from this hospitalization (including imaging, microbiology, ancillary and laboratory) are listed below for reference.     Microbiology: No results found for this or any previous visit (from the past 240 hour(s)).   Labs: BNP (last 3 results) No results for input(s): "BNP" in the last 8760 hours. Basic Metabolic Panel: Recent Labs  Lab 08/06/21 1242 08/07/21 0300 08/08/21 0331  NA 138 138 141  K 3.8 4.1 4.2  CL 104 107 110  CO2 27 24 26   GLUCOSE 127* 117* 105*  BUN 27* 20 18  CREATININE 0.73 0.56 0.68  CALCIUM 8.9 8.2* 7.4*   Liver Function Tests: No  results for input(s): "AST", "ALT", "ALKPHOS", "BILITOT", "PROT", "ALBUMIN" in the last 168 hours. No results for input(s): "LIPASE", "AMYLASE" in the  last 168 hours. No results for input(s): "AMMONIA" in the last 168 hours. CBC: Recent Labs  Lab 08/06/21 1242 08/07/21 0300 08/08/21 0331 08/09/21 0332  WBC 11.6* 10.7* 10.5 8.8  HGB 16.0* 15.0 11.7* 11.4*  HCT 48.3* 45.8 35.7* 35.4*  MCV 95.5 96.2 96.5 97.5  PLT 190 157 137* 154   Cardiac Enzymes: No results for input(s): "CKTOTAL", "CKMB", "CKMBINDEX", "TROPONINI" in the last 168 hours. BNP: Invalid input(s): "POCBNP" CBG: No results for input(s): "GLUCAP" in the last 168 hours. D-Dimer No results for input(s): "DDIMER" in the last 72 hours. Hgb A1c No results for input(s): "HGBA1C" in the last 72 hours. Lipid Profile No results for input(s): "CHOL", "HDL", "LDLCALC", "TRIG", "CHOLHDL", "LDLDIRECT" in the last 72 hours. Thyroid function studies No results for input(s): "TSH", "T4TOTAL", "T3FREE", "THYROIDAB" in the last 72 hours.  Invalid input(s): "FREET3" Anemia work up No results for input(s): "VITAMINB12", "FOLATE", "FERRITIN", "TIBC", "IRON", "RETICCTPCT" in the last 72 hours. Urinalysis    Component Value Date/Time   COLORURINE AMBER (A) 12/03/2019 2313   APPEARANCEUR HAZY (A) 12/03/2019 2313   LABSPEC 1.012 12/03/2019 2313   PHURINE 5.0 12/03/2019 2313   GLUCOSEU NEGATIVE 12/03/2019 2313   HGBUR MODERATE (A) 12/03/2019 2313   BILIRUBINUR NEGATIVE 12/03/2019 2313   KETONESUR NEGATIVE 12/03/2019 2313   PROTEINUR NEGATIVE 12/03/2019 2313   UROBILINOGEN 0.2 06/17/2012 1645   NITRITE NEGATIVE 12/03/2019 2313   LEUKOCYTESUR LARGE (A) 12/03/2019 2313   Sepsis Labs Recent Labs  Lab 08/06/21 1242 08/07/21 0300 08/08/21 0331 08/09/21 0332  WBC 11.6* 10.7* 10.5 8.8   Microbiology No results found for this or any previous visit (from the past 240 hour(s)).   Time coordinating discharge: Over 30  minutes  SIGNED:   Alvira Philips [place], DO  [facility] 08/09/2021, 11:37 AM

## 2021-08-09 NOTE — Plan of Care (Signed)
  Problem: Pain Managment: Goal: General experience of comfort will improve Outcome: Progressing   Problem: Coping: Goal: Level of anxiety will decrease Outcome: Progressing   Problem: Safety: Goal: Ability to remain free from injury will improve Outcome: Progressing   

## 2021-09-28 IMAGING — DX DG HAND COMPLETE 3+V*L*
3 series · 3 of 3 positions shown · non-contrast
Comparison: None.

CLINICAL DATA: [AGE] female with fall.

EXAM:
LEFT HAND - COMPLETE 3+ VIEW

[hand pa]
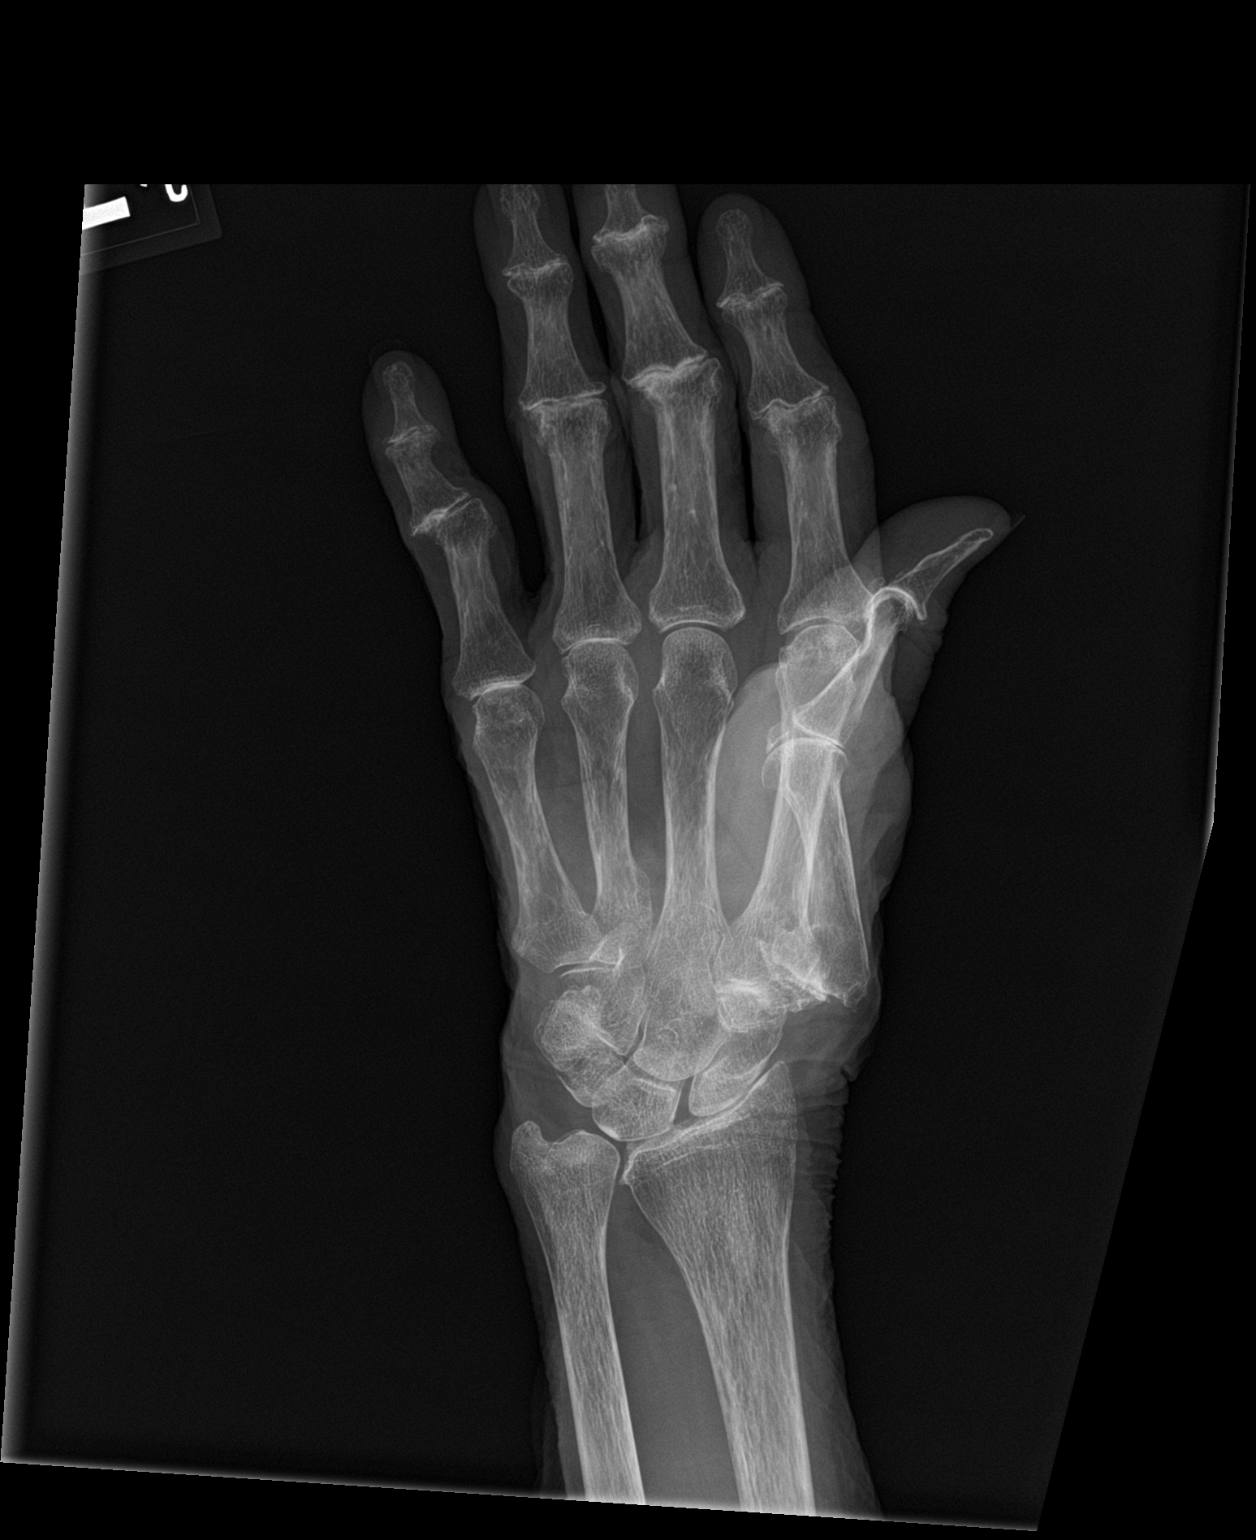

[hand obl]
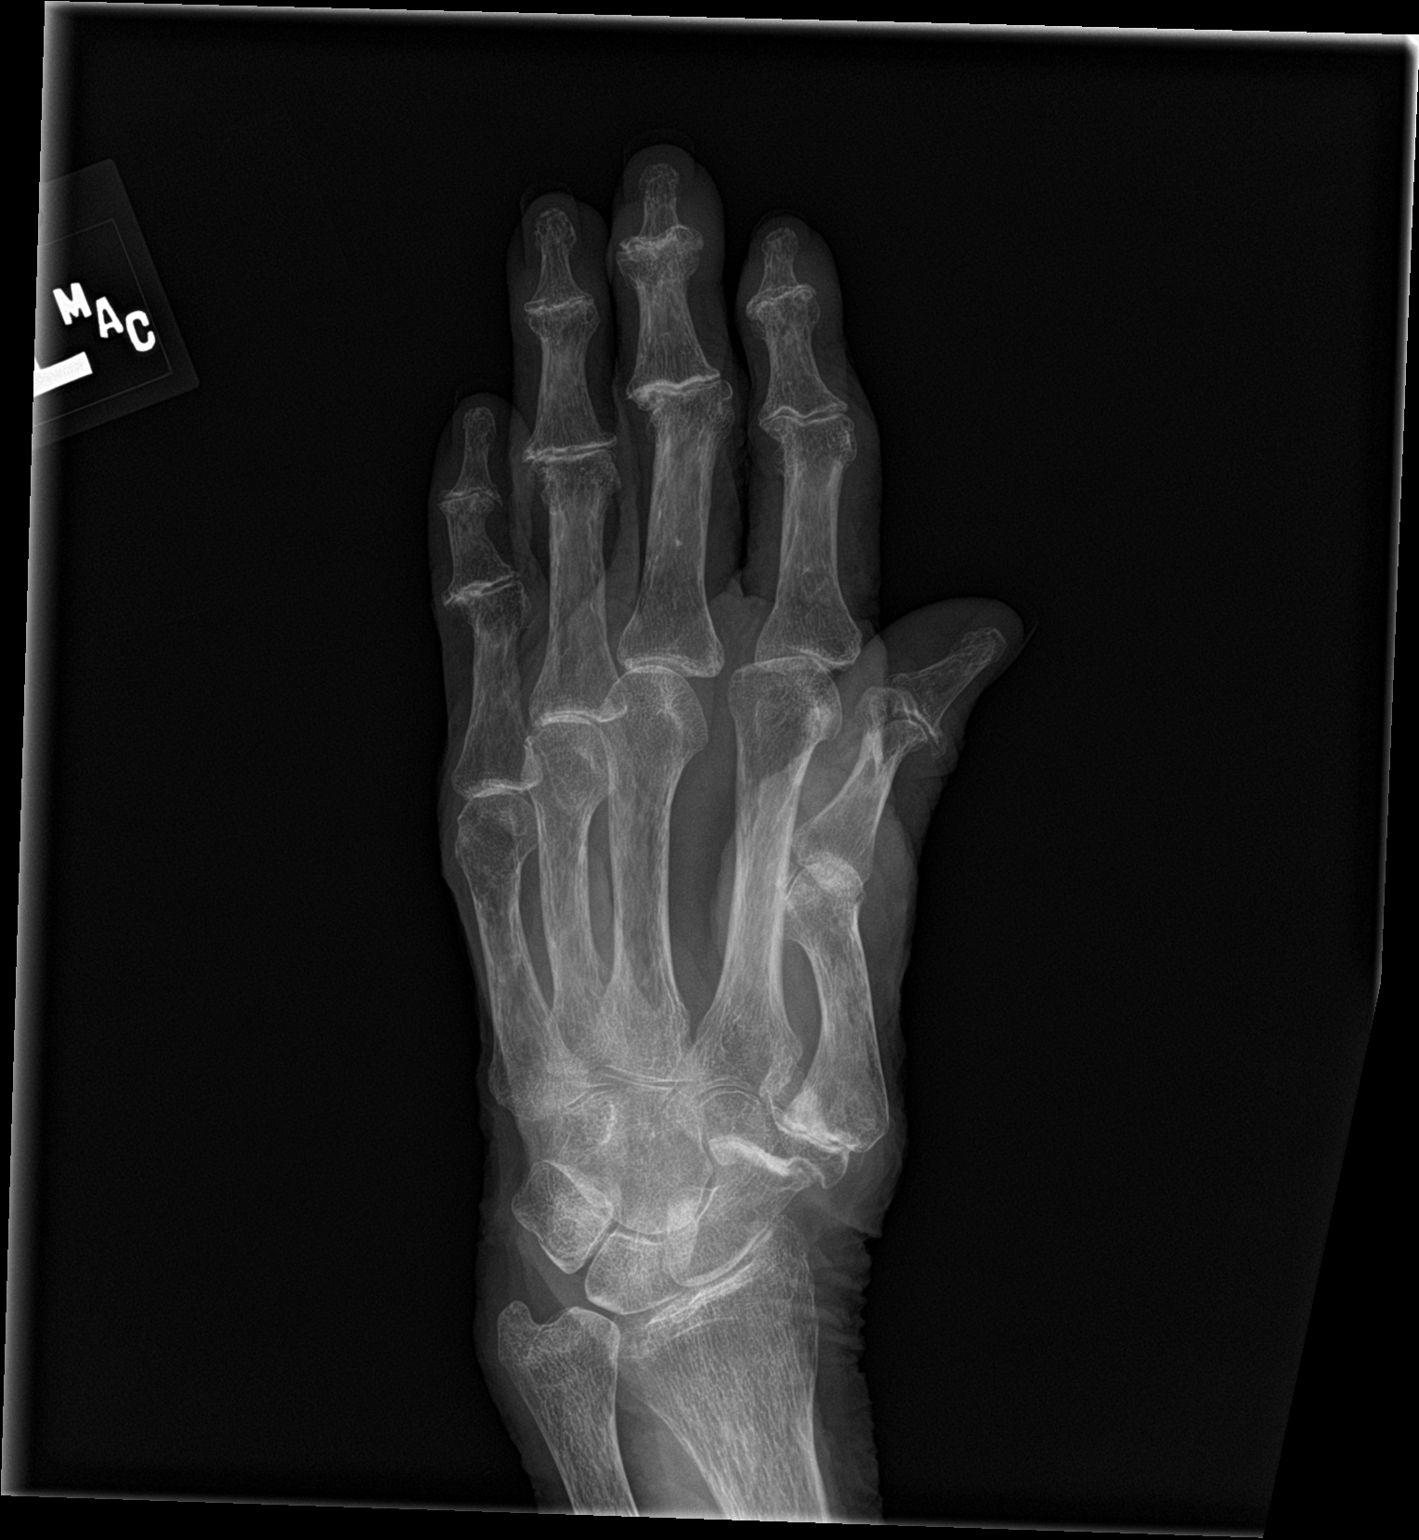

[hand lat]
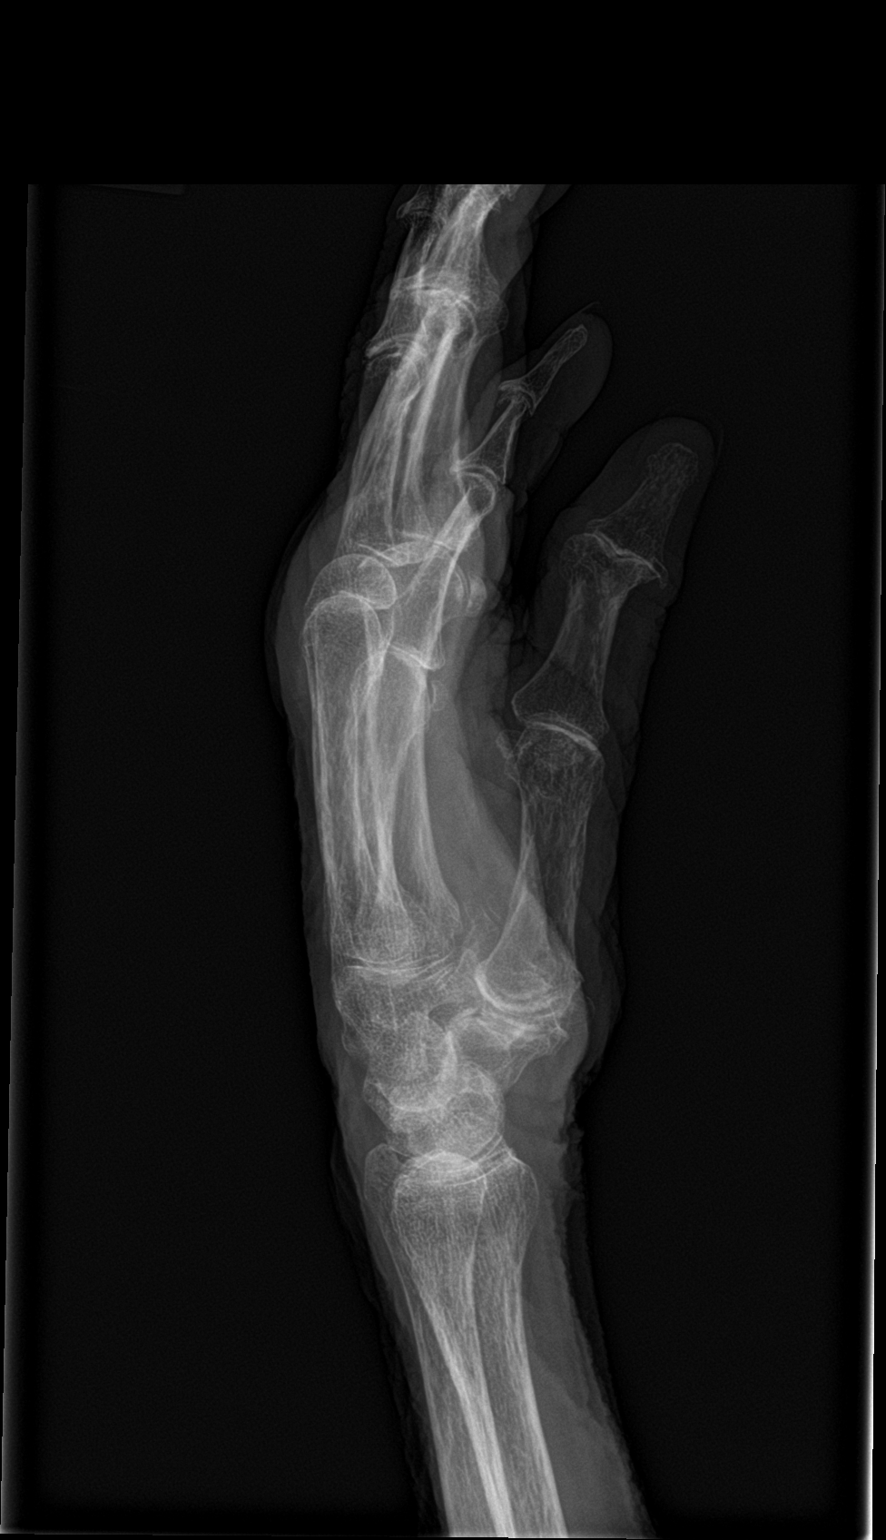

[3 of 3 positions shown; findings below may reference images not displayed]

FINDINGS: There is no acute fracture or dislocation. The bones are osteopenic.
There is osteoarthritic changes of the base of the thumb as well as
erosive osteoarthritis of the interphalangeal joints. Mild chronic
subluxation of the fifth PIP joint. The soft tissues are
unremarkable.
IMPRESSION: 1. No acute fracture or dislocation.
2. Osteopenia with osteoarthritis.

## 2021-09-28 IMAGING — DX DG CHEST 2V
2 series · 2 of 2 positions shown · non-contrast
Comparison: Chest radiograph dated 04/30/2019.

CLINICAL DATA: [AGE] female with fall.

EXAM:
CHEST - 2 VIEW

[chest lat]
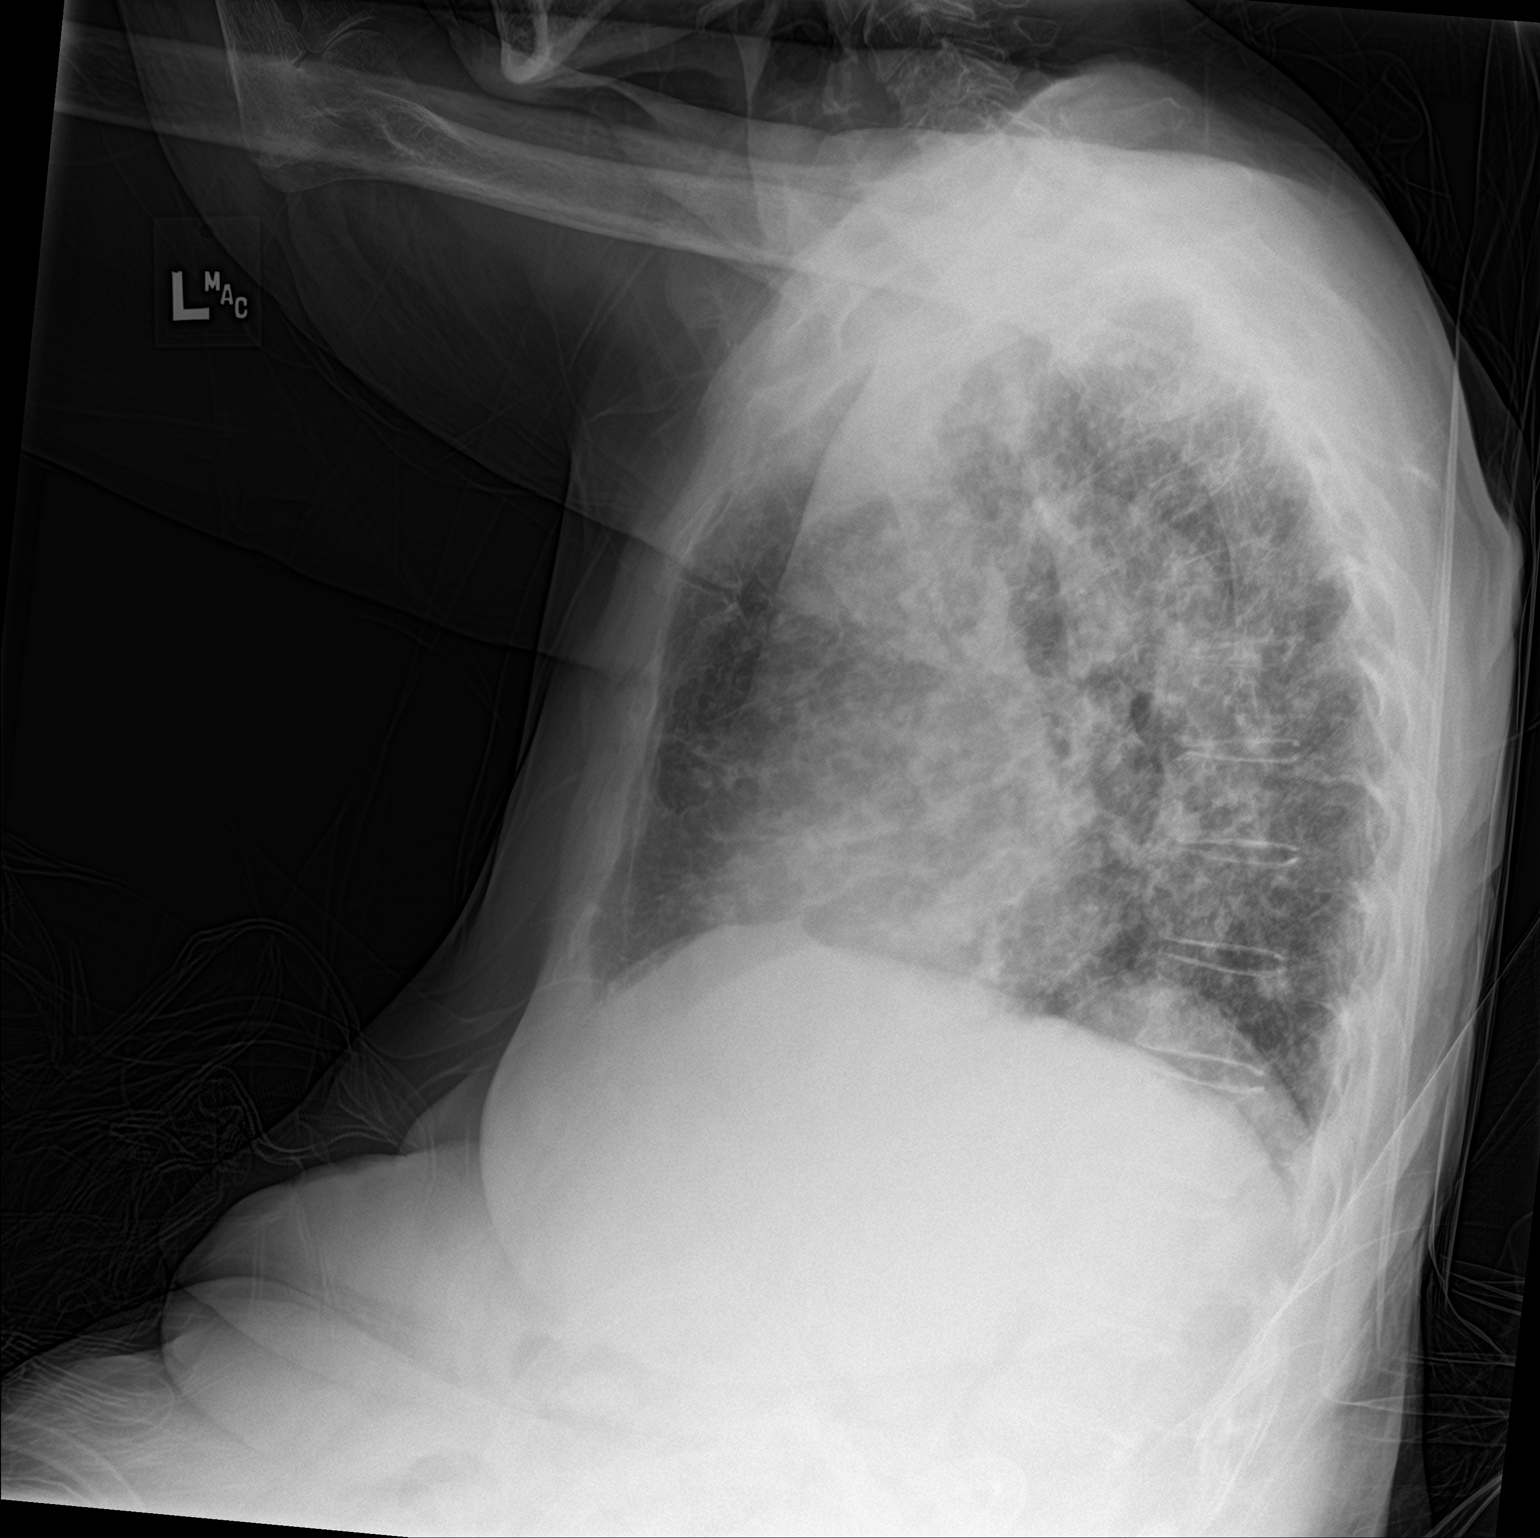

[chest ap strecther]
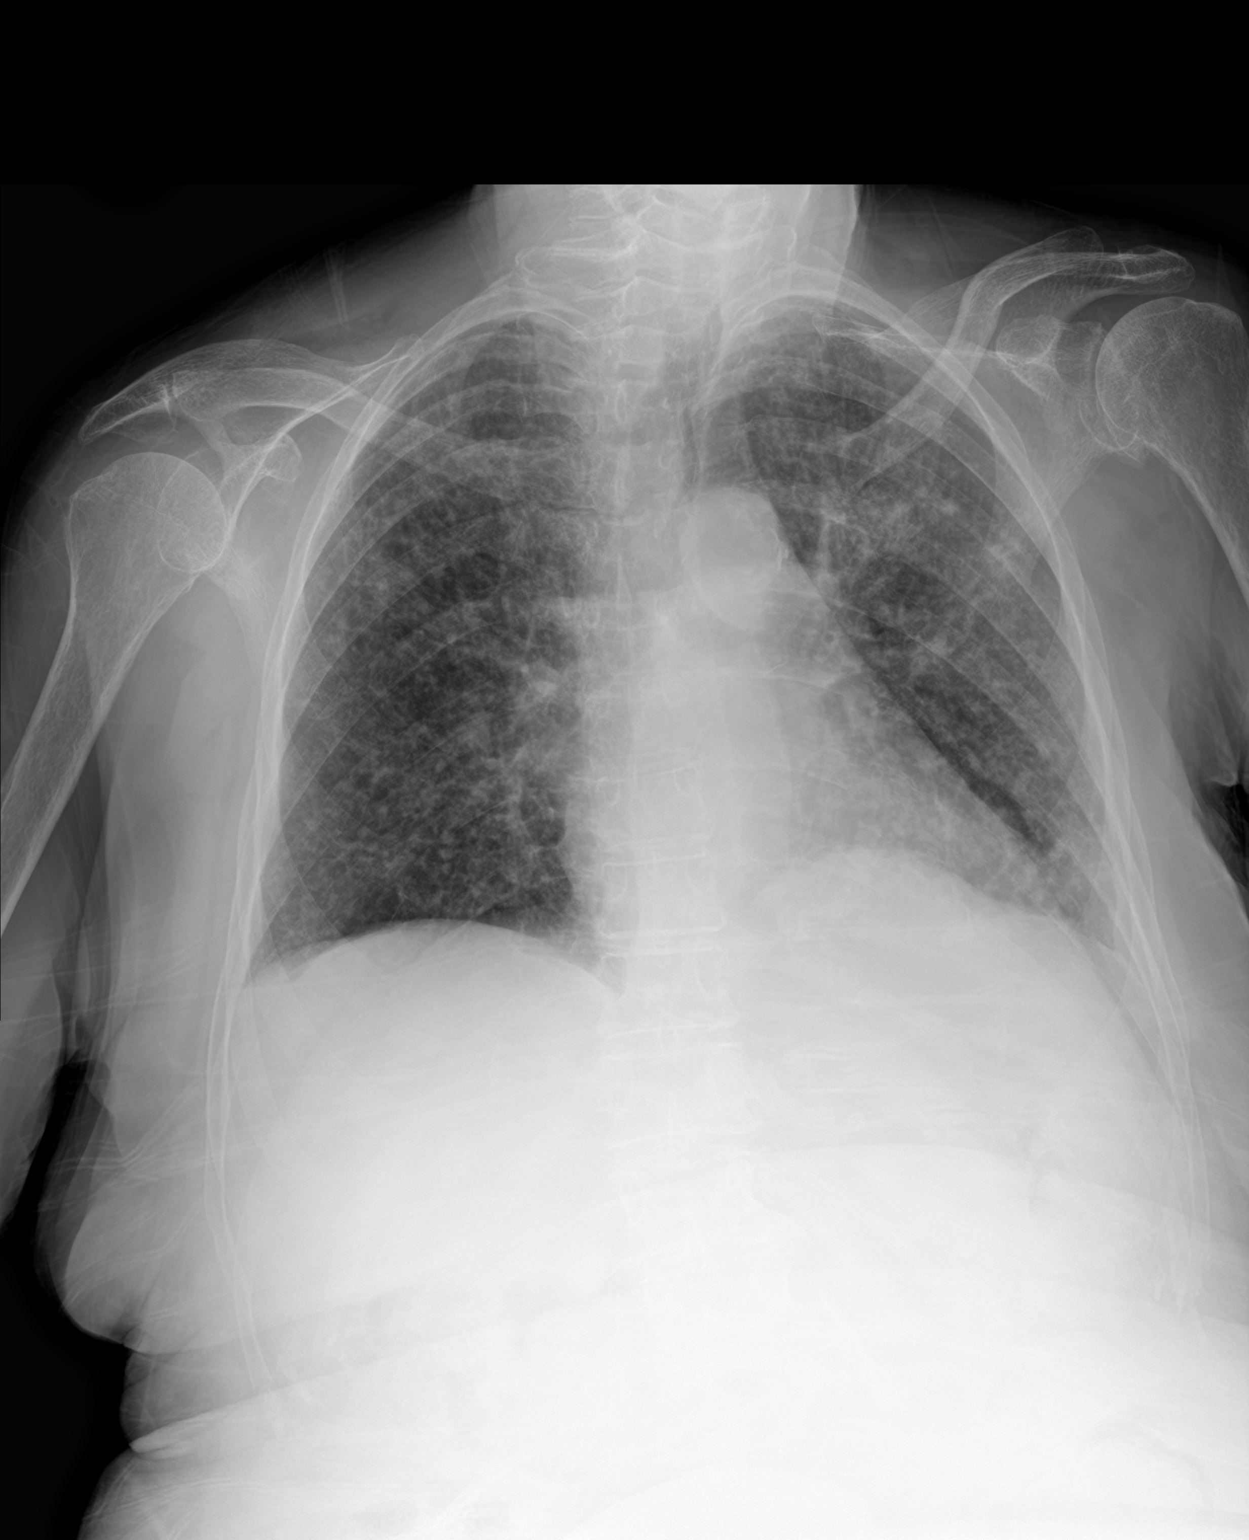

[2 of 2 positions shown; findings below may reference images not displayed]

FINDINGS: Diffuse bilateral pulmonary nodules similar to prior radiograph. No
new consolidative changes. There is no pleural effusion
pneumothorax. Stable cardiac silhouette. Atherosclerotic
calcification of the aorta. No acute osseous pathology.
IMPRESSION: No significant interval change in the diffuse bilateral pulmonary
nodules. No new consolidation.

## 2022-04-02 ENCOUNTER — Encounter (HOSPITAL_COMMUNITY): Payer: Self-pay

## 2022-04-02 ENCOUNTER — Observation Stay (HOSPITAL_COMMUNITY)
Admission: EM | Admit: 2022-04-02 | Discharge: 2022-04-03 | Disposition: A | Discharge status: Home / Self Care | Payer: Medicare Other | Attending: Internal Medicine | Admitting: Internal Medicine

## 2022-04-02 ENCOUNTER — Other Ambulatory Visit: Payer: Self-pay

## 2022-04-02 ENCOUNTER — Emergency Department (HOSPITAL_COMMUNITY): Payer: Medicare Other

## 2022-04-02 DIAGNOSIS — E039 Hypothyroidism, unspecified: Secondary | ICD-10-CM | POA: Insufficient documentation

## 2022-04-02 DIAGNOSIS — J45909 Unspecified asthma, uncomplicated: Secondary | ICD-10-CM | POA: Diagnosis not present

## 2022-04-02 DIAGNOSIS — E876 Hypokalemia: Secondary | ICD-10-CM | POA: Diagnosis not present

## 2022-04-02 DIAGNOSIS — G9341 Metabolic encephalopathy: Secondary | ICD-10-CM | POA: Diagnosis not present

## 2022-04-02 DIAGNOSIS — F32A Depression, unspecified: Secondary | ICD-10-CM | POA: Diagnosis present

## 2022-04-02 DIAGNOSIS — F0280 Dementia in other diseases classified elsewhere without behavioral disturbance: Secondary | ICD-10-CM | POA: Insufficient documentation

## 2022-04-02 DIAGNOSIS — F0390 Unspecified dementia without behavioral disturbance: Secondary | ICD-10-CM | POA: Diagnosis present

## 2022-04-02 DIAGNOSIS — J441 Chronic obstructive pulmonary disease with (acute) exacerbation: Secondary | ICD-10-CM | POA: Diagnosis present

## 2022-04-02 DIAGNOSIS — N390 Urinary tract infection, site not specified: Secondary | ICD-10-CM | POA: Insufficient documentation

## 2022-04-02 DIAGNOSIS — Z79899 Other long term (current) drug therapy: Secondary | ICD-10-CM | POA: Diagnosis not present

## 2022-04-02 DIAGNOSIS — G309 Alzheimer's disease, unspecified: Secondary | ICD-10-CM | POA: Diagnosis not present

## 2022-04-02 DIAGNOSIS — R197 Diarrhea, unspecified: Principal | ICD-10-CM | POA: Insufficient documentation

## 2022-04-02 DIAGNOSIS — E785 Hyperlipidemia, unspecified: Secondary | ICD-10-CM | POA: Diagnosis present

## 2022-04-02 DIAGNOSIS — J449 Chronic obstructive pulmonary disease, unspecified: Secondary | ICD-10-CM | POA: Diagnosis not present

## 2022-04-02 DIAGNOSIS — J9611 Chronic respiratory failure with hypoxia: Secondary | ICD-10-CM | POA: Diagnosis present

## 2022-04-02 DIAGNOSIS — I10 Essential (primary) hypertension: Secondary | ICD-10-CM | POA: Diagnosis not present

## 2022-04-02 DIAGNOSIS — R109 Unspecified abdominal pain: Secondary | ICD-10-CM | POA: Diagnosis not present

## 2022-04-02 DIAGNOSIS — N3000 Acute cystitis without hematuria: Secondary | ICD-10-CM

## 2022-04-02 LAB — COMPREHENSIVE METABOLIC PANEL
ALT: 11 U/L (ref 0–44)
AST: 25 U/L (ref 15–41)
Albumin: 3.3 g/dL — ABNORMAL LOW (ref 3.5–5.0)
Alkaline Phosphatase: 85 U/L (ref 38–126)
Anion gap: 10 (ref 5–15)
BUN: 27 mg/dL — ABNORMAL HIGH (ref 8–23)
CO2: 22 mmol/L (ref 22–32)
Calcium: 8.4 mg/dL — ABNORMAL LOW (ref 8.9–10.3)
Chloride: 109 mmol/L (ref 98–111)
Creatinine, Ser: 0.73 mg/dL (ref 0.44–1.00)
GFR, Estimated: >60 mL/min (ref >60)
Glucose, Bld: 85 mg/dL (ref 70–99)
Potassium: 3.3 mmol/L — ABNORMAL LOW (ref 3.5–5.1)
Sodium: 141 mmol/L (ref 135–145)
Total Bilirubin: 0.5 mg/dL (ref 0.3–1.2)
Total Protein: 6.4 g/dL — ABNORMAL LOW (ref 6.5–8.1)

## 2022-04-02 LAB — CBC WITH DIFFERENTIAL/PLATELET
Abs Immature Granulocytes: 0.06 10*3/uL (ref 0.00–0.07)
Basophils Absolute: 0 10*3/uL (ref 0.0–0.1)
Basophils Relative: 0 %
Eosinophils Absolute: 0 10*3/uL (ref 0.0–0.5)
Eosinophils Relative: 0 %
HCT: 43.3 % (ref 36.0–46.0)
Hemoglobin: 15.1 g/dL — ABNORMAL HIGH (ref 12.0–15.0)
Immature Granulocytes: 0 %
Lymphocytes Relative: 9 %
Lymphs Abs: 1.2 10*3/uL (ref 0.7–4.0)
MCH: 31.8 pg (ref 26.0–34.0)
MCHC: 34.9 g/dL (ref 30.0–36.0)
MCV: 91.2 fL (ref 80.0–100.0)
Monocytes Absolute: 0.9 10*3/uL (ref 0.1–1.0)
Monocytes Relative: 6 %
Neutro Abs: 11.5 10*3/uL — ABNORMAL HIGH (ref 1.7–7.7)
Neutrophils Relative %: 85 %
Platelets: 273 10*3/uL (ref 150–400)
RBC: 4.75 MIL/uL (ref 3.87–5.11)
RDW: 13.6 % (ref 11.5–15.5)
WBC: 13.7 10*3/uL — ABNORMAL HIGH (ref 4.0–10.5)
nRBC: 0 % (ref 0.0–0.2)

## 2022-04-02 LAB — LIPASE, BLOOD: Lipase: 67 U/L — ABNORMAL HIGH (ref 11–51)

## 2022-04-02 MED ORDER — SODIUM CHLORIDE 0.9 % IV BOLUS
500.0000 mL | Freq: Once | INTRAVENOUS | Status: AC
Start: 2022-04-02 — End: 2022-04-02
  Administered 2022-04-02: 500 mL via INTRAVENOUS

## 2022-04-02 NOTE — ED Notes (Signed)
Patient presents via ems from home with c/o diarrhea. Patient is Spanish speaking and has severe Alzheimers and unable to interact with translator. Patient has no family present. Story per EMS is pt got up in middle of night and drank unknown amount of canola oil and had a couple bouts of diarrhea today and family called for her to be brought to er for evaluation. Patient calm cooperative confused reports no pain denies diarrhea at this time. Patient in brief from home so assumed to be incontinent of bowel and bladder but currently clean and dry

## 2022-04-02 NOTE — ED Provider Notes (Incomplete)
[facility] Provider Note   CSN: [id number] Arrival date & time: 04/02/22  1955     History  Chief Complaint  Patient presents with  . Diarrhea    Pt arrived via ems from home after drinking unknown amount of Canola oil last night and having diarrhea today has alzheimers unable to answer questions no family present    Mackenzie Peterson is a 89 y.o. female with history of dementia, COPD, incontinence, hypothyroidism, on 2 L of oxygen at night presents the emergency room today for evaluation of multiple episodes of diarrhea.  History is limited due to language barrier and patient's dementia.  Unfortunately, she did not come with any family members at bedside.  The patient reports to me that she has been having nausea and a decreased appetite.  She reports that she has been having diarrhea.  She reports has been for 5 days and reports been for 3 days and she reports it has been for 2 days, so unsure when the exact time it started was.  She thinks that she may have had a fever.  Reports it hurts whenever she urinates and has a bowel movement.  She denies any melena or hematochezia.  Reports a decreased appetite.  She reports that she has had diffuse abdominal pain but is unable to tell me when that started.  I was able to get a hold of the patient's daughter-in-law and spoke with her on the phone.  She reports that in the middle the night/early this morning she drink at least a glass full of canola oil.  She has been having diarrhea since this morning.  At least 6 episodes of watery stools it is not black or bloody.  Daughter-in-law reports that she is giving her Imodium and Pepto-Bismol but she is still having diarrhea.  Daughter-in-law also reports that she is at her baseline mentation.  She is concerned given that she has had multiple broken hips that she may fall going up to the bathroom.  The history is limited by a language barrier and the absence of a  caregiver. A language interpreter was used.  Diarrhea      Home Medications Prior to Admission medications   Medication Sig Start Date End Date Taking? Authorizing Provider  acetaminophen (TYLENOL) 500 MG tablet Take 500-1,000 mg by mouth every 6 (six) hours as needed for mild pain or headache.    [provider]  albuterol (PROVENTIL) (2.5 MG/3ML) 0.083% nebulizer solution Take 2.5 mg by nebulization 3 (three) times daily as needed for wheezing or shortness of breath.     [provider]  ALPRAZolam Duanne Moron) 0.5 MG tablet Take 1 tablet (0.5 mg total) by mouth 3 (three) times daily as needed for anxiety. Patient taking differently: Take 0.5 mg by mouth 2 (two) times daily. 12/04/19   Charlynne Cousins, MD  cetirizine (ZYRTEC) 10 MG tablet Take 10 mg by mouth at bedtime.    [provider]  docusate sodium (COLACE) 100 MG capsule Take 1 capsule (100 mg total) by mouth 2 (two) times daily. 08/09/21   [place] Eric J, DO  donepezil (ARICEPT) 10 MG tablet Take 10 mg by mouth at bedtime. 09/10/19   [provider]  levothyroxine (SYNTHROID, LEVOTHROID) 25 MCG tablet Take 25 mcg by mouth daily before breakfast.     [provider]  memantine (NAMENDA) 10 MG tablet Take 10 mg by mouth 2 (two) times daily.    [provider]  methocarbamol (ROBAXIN) 500 MG tablet Take 1 tablet (500 mg total) by mouth every 6 (six) hours as needed for muscle spasms. 08/09/21   [place] Donnamarie Poag, DO  Multiple Vitamins-Minerals (ONE-A-DAY PROACTIVE 65+) TABS Take 1 tablet by mouth daily with breakfast.    [provider]  nitroGLYCERIN (NITROSTAT) 0.4 MG SL tablet Place 0.4 mg under the tongue every 5 (five) minutes as needed for chest pain.     [provider]  omeprazole (PRILOSEC) 20 MG capsule TAKE 1 CAPSULE BY MOUTH 30 TO 60 MINUTES BEFORE THE FIRST AND LAST MEALS OF THE DAY Patient taking differently: Take 20 mg by mouth 2 (two) times daily before a  meal.    Brand Males, MD  OXYGEN Inhale 2 L/min into the lungs at bedtime.    [provider]  polyethylene glycol (MIRALAX / GLYCOLAX) 17 g packet Take 17 g by mouth daily as needed for mild constipation. 08/09/21   [place] Eric J, DO  pravastatin (PRAVACHOL) 20 MG tablet Take 20 mg by mouth at bedtime. 04/17/19   [provider]  QUEtiapine (SEROQUEL) 100 MG tablet Take 100 mg by mouth 2 (two) times daily.    [provider]  senna (SENOKOT) 8.6 MG TABS tablet Take 1 tablet (8.6 mg total) by mouth 2 (two) times daily. 08/09/21   [place] Donnamarie Poag, DO  sertraline (ZOLOFT) 25 MG tablet Take 25 mg by mouth at bedtime. 04/20/19   [provider]  solifenacin (VESICARE) 5 MG tablet Take 5 mg by mouth at bedtime.     [provider]  traZODone (DESYREL) 50 MG tablet Take 50 mg by mouth at bedtime.    [provider]      Allergies    Patient has no known allergies.    Review of Systems   Review of Systems  Unable to perform ROS: Dementia  Gastrointestinal:  Positive for diarrhea.    Physical Exam Updated Vital Signs BP (!) 126/57   Pulse 77   Temp 98.2 F (36.8 C) (Oral)   Resp (!) 24   Ht 5' (1.524 m)   Wt 52.2 kg   SpO2 95%   BMI 22.46 kg/m  Physical Exam Vitals and nursing note reviewed.  Constitutional:      General: She is not in acute distress.    Appearance: She is not ill-appearing or toxic-appearing.     Comments: Pleasantly demented  HENT:     Mouth/Throat:     Mouth: Mucous membranes are moist.  Cardiovascular:     Rate and Rhythm: Normal rate.  Pulmonary:     Effort: Pulmonary effort is normal. No respiratory distress.  Abdominal:     General: There is no distension.     Palpations: Abdomen is soft.     Tenderness: There is abdominal tenderness. There is no guarding or rebound.     Comments: Reports diffuse abdominal tenderness, but does not appear to be in pain on palpation. Abdomen is soft and non  distended. NBS.  Musculoskeletal:     Cervical back: Normal range of motion.  Skin:    General: Skin is warm and dry.  Neurological:     Mental Status: She is alert. Mental status is at baseline.     ED Results / Procedures / Treatments   Labs (all labs ordered are listed, but only abnormal results are displayed) Labs Reviewed - No data to display  EKG None  Radiology No results found.  Procedures  Procedures   Medications Ordered in ED Medications - No data to display  ED Course/ Medical Decision Making/ A&P                           Medical Decision Making Amount and/or Complexity of Data Reviewed Labs: ordered. Radiology: ordered.   ***  [facility].  There is no toxicity for control oil.  Does have a laxative effect.  Recommended just monitoring her electrolytes.   Final Clinical Impression(s) / ED Diagnoses Final diagnoses:  None    Rx / DC Orders ED Discharge Orders     None

## 2022-04-02 NOTE — ED Provider Notes (Signed)
[facility] Provider Note   CSN: [id number] Arrival date & time: 04/02/22  1955     History {Add pertinent medical, surgical, social history, OB history to HPI:1} Chief Complaint  Patient presents with   Diarrhea    Pt arrived via ems from home after drinking unknown amount of Canola oil last night and having diarrhea today has alzheimers unable to answer questions no family present    Mackenzie Peterson is a 89 y.o. female.   Diarrhea      Home Medications Prior to Admission medications   Medication Sig Start Date End Date Taking? Authorizing Provider  acetaminophen (TYLENOL) 500 MG tablet Take 500-1,000 mg by mouth every 6 (six) hours as needed for mild pain or headache.    [provider]  albuterol (PROVENTIL) (2.5 MG/3ML) 0.083% nebulizer solution Take 2.5 mg by nebulization 3 (three) times daily as needed for wheezing or shortness of breath.     [provider]  ALPRAZolam Duanne Moron) 0.5 MG tablet Take 1 tablet (0.5 mg total) by mouth 3 (three) times daily as needed for anxiety. Patient taking differently: Take 0.5 mg by mouth 2 (two) times daily. 12/04/19   Charlynne Cousins, MD  cetirizine (ZYRTEC) 10 MG tablet Take 10 mg by mouth at bedtime.    [provider]  docusate sodium (COLACE) 100 MG capsule Take 1 capsule (100 mg total) by mouth 2 (two) times daily. 08/09/21   [place] Eric J, DO  donepezil (ARICEPT) 10 MG tablet Take 10 mg by mouth at bedtime. 09/10/19   [provider]  levothyroxine (SYNTHROID, LEVOTHROID) 25 MCG tablet Take 25 mcg by mouth daily before breakfast.     [provider]  memantine (NAMENDA) 10 MG tablet Take 10 mg by mouth 2 (two) times daily.    [provider]  methocarbamol (ROBAXIN) 500 MG tablet Take 1 tablet (500 mg total) by mouth every 6 (six) hours as needed for muscle spasms. 08/09/21   [place] Donnamarie Poag, DO  Multiple Vitamins-Minerals (ONE-A-DAY  PROACTIVE 65+) TABS Take 1 tablet by mouth daily with breakfast.    [provider]  nitroGLYCERIN (NITROSTAT) 0.4 MG SL tablet Place 0.4 mg under the tongue every 5 (five) minutes as needed for chest pain.     [provider]  omeprazole (PRILOSEC) 20 MG capsule TAKE 1 CAPSULE BY MOUTH 30 TO 60 MINUTES BEFORE THE FIRST AND LAST MEALS OF THE DAY Patient taking differently: Take 20 mg by mouth 2 (two) times daily before a meal.    Brand Males, MD  OXYGEN Inhale 2 L/min into the lungs at bedtime.    [provider]  polyethylene glycol (MIRALAX / GLYCOLAX) 17 g packet Take 17 g by mouth daily as needed for mild constipation. 08/09/21   [place] Eric J, DO  pravastatin (PRAVACHOL) 20 MG tablet Take 20 mg by mouth at bedtime. 04/17/19   [provider]  QUEtiapine (SEROQUEL) 100 MG tablet Take 100 mg by mouth 2 (two) times daily.    [provider]  senna (SENOKOT) 8.6 MG TABS tablet Take 1 tablet (8.6 mg total) by mouth 2 (two) times daily. 08/09/21   [place] Donnamarie Poag, DO  sertraline (ZOLOFT) 25 MG tablet Take 25 mg by mouth at bedtime. 04/20/19   [provider]  solifenacin (VESICARE) 5 MG tablet Take 5 mg by mouth at bedtime.     [provider]  traZODone (DESYREL) 50 MG tablet Take  50 mg by mouth at bedtime.    [provider]      Allergies    Patient has no known allergies.    Review of Systems   Review of Systems  Gastrointestinal:  Positive for diarrhea.    Physical Exam Updated Vital Signs BP (!) 126/57   Pulse 77   Temp 98.2 F (36.8 C) (Oral)   Resp (!) 24   Ht 5' (1.524 m)   Wt 52.2 kg   SpO2 95%   BMI 22.46 kg/m  Physical Exam  ED Results / Procedures / Treatments   Labs (all labs ordered are listed, but only abnormal results are displayed) Labs Reviewed - No data to display  EKG None  Radiology No results found.  Procedures Procedures  {Document cardiac monitor, telemetry assessment  procedure when appropriate:1}  Medications Ordered in ED Medications - No data to display  ED Course/ Medical Decision Making/ A&P   {   Click here for ABCD2, HEART and other calculatorsREFRESH Note before signing :1}                          Medical Decision Making  ***  {Document critical care time when appropriate:1} {Document review of labs and clinical decision tools ie heart score, Chads2Vasc2 etc:1}  {Document your independent review of radiology images, and any outside records:1} {Document your discussion with family members, caretakers, and with consultants:1} {Document social determinants of health affecting pt's care:1} {Document your decision making why or why not admission, treatments were needed:1} Final Clinical Impression(s) / ED Diagnoses Final diagnoses:  None    Rx / DC Orders ED Discharge Orders     None

## 2022-04-03 ENCOUNTER — Observation Stay (HOSPITAL_COMMUNITY): Payer: Medicare Other

## 2022-04-03 DIAGNOSIS — G9341 Metabolic encephalopathy: Secondary | ICD-10-CM

## 2022-04-03 DIAGNOSIS — N390 Urinary tract infection, site not specified: Secondary | ICD-10-CM

## 2022-04-03 DIAGNOSIS — E876 Hypokalemia: Secondary | ICD-10-CM

## 2022-04-03 DIAGNOSIS — R197 Diarrhea, unspecified: Secondary | ICD-10-CM | POA: Diagnosis not present

## 2022-04-03 LAB — BASIC METABOLIC PANEL
Anion gap: 11 (ref 5–15)
BUN: 18 mg/dL (ref 8–23)
CO2: 18 mmol/L — ABNORMAL LOW (ref 22–32)
Calcium: 7.6 mg/dL — ABNORMAL LOW (ref 8.9–10.3)
Chloride: 114 mmol/L — ABNORMAL HIGH (ref 98–111)
Creatinine, Ser: 0.76 mg/dL (ref 0.44–1.00)
GFR, Estimated: >60 mL/min (ref >60)
Glucose, Bld: 108 mg/dL — ABNORMAL HIGH (ref 70–99)
Potassium: 3.2 mmol/L — ABNORMAL LOW (ref 3.5–5.1)
Sodium: 143 mmol/L (ref 135–145)

## 2022-04-03 LAB — CBC
HCT: 44.4 % (ref 36.0–46.0)
Hemoglobin: 14.7 g/dL (ref 12.0–15.0)
MCH: 31.5 pg (ref 26.0–34.0)
MCHC: 33.1 g/dL (ref 30.0–36.0)
MCV: 95.1 fL (ref 80.0–100.0)
Platelets: 254 10*3/uL (ref 150–400)
RBC: 4.67 MIL/uL (ref 3.87–5.11)
RDW: 14 % (ref 11.5–15.5)
WBC: 10.3 10*3/uL (ref 4.0–10.5)
nRBC: 0 % (ref 0.0–0.2)

## 2022-04-03 LAB — URINALYSIS, W/ REFLEX TO CULTURE (INFECTION SUSPECTED)
Bilirubin Urine: NEGATIVE
Glucose, UA: NEGATIVE mg/dL
Hgb urine dipstick: NEGATIVE
Ketones, ur: 20 mg/dL — AB
Nitrite: POSITIVE — AB
Protein, ur: 30 mg/dL — AB
Specific Gravity, Urine: >1.046 — ABNORMAL HIGH (ref 1.005–1.030)
pH: 5 (ref 5.0–8.0)

## 2022-04-03 LAB — MAGNESIUM: Magnesium: 1.8 mg/dL (ref 1.7–2.4)

## 2022-04-03 LAB — TSH: TSH: 2.298 u[IU]/mL (ref 0.350–4.500)

## 2022-04-03 LAB — AMMONIA: Ammonia: 25 umol/L (ref 9–35)

## 2022-04-03 MED ORDER — IOHEXOL 350 MG/ML SOLN
50.0000 mL | Freq: Once | INTRAVENOUS | Status: AC | PRN
  Administered 2022-04-03: 50 mL via INTRAVENOUS

## 2022-04-03 MED ORDER — CEPHALEXIN 500 MG PO CAPS
500.0000 mg | ORAL_CAPSULE | Freq: Three times a day (TID) | ORAL | Status: DC
  Administered 2022-04-03: 500 mg via ORAL
  Filled 2022-04-03: qty 1

## 2022-04-03 MED ORDER — ACETAMINOPHEN 325 MG PO TABS: 650.0000 mg | ORAL_TABLET | Freq: Four times a day (QID) | ORAL | Status: DC | PRN

## 2022-04-03 MED ORDER — ACETAMINOPHEN 650 MG RE SUPP: 650.0000 mg | Freq: Four times a day (QID) | RECTAL | Status: DC | PRN

## 2022-04-03 MED ORDER — SODIUM CHLORIDE 0.9 % IV SOLN
1.0000 g | Freq: Once | INTRAVENOUS | Status: AC
  Administered 2022-04-03: 1 g via INTRAVENOUS
  Filled 2022-04-03: qty 10

## 2022-04-03 MED ORDER — SODIUM CHLORIDE 0.9 % IV SOLN: 1.0000 g | INTRAVENOUS | Status: DC

## 2022-04-03 MED ORDER — CEPHALEXIN 500 MG PO CAPS: 500.0000 mg | ORAL_CAPSULE | Freq: Three times a day (TID) | ORAL | 0 refills | Status: AC

## 2022-04-03 MED ORDER — LOPERAMIDE HCL 2 MG PO CAPS: 2.0000 mg | ORAL_CAPSULE | ORAL | Status: DC | PRN

## 2022-04-03 MED ORDER — SODIUM CHLORIDE 0.9 % IV SOLN: INTRAVENOUS | Status: AC

## 2022-04-03 MED ORDER — POTASSIUM CHLORIDE 20 MEQ PO PACK
40.0000 meq | PACK | Freq: Once | ORAL | Status: AC
  Administered 2022-04-03: 40 meq via ORAL
  Filled 2022-04-03: qty 2

## 2022-04-03 NOTE — Plan of Care (Signed)
  Problem: Education: Goal: Knowledge of General Education information will improve Description Including pain rating scale, medication(s)/side effects and non-pharmacologic comfort measures Outcome: Progressing   Problem: Health Behavior/Discharge Planning: Goal: Ability to manage health-related needs will improve Outcome: Progressing   

## 2022-04-03 NOTE — Progress Notes (Signed)
Called and asked interpreter to come to bedside so I could do my assessment on patient  and answer patient's questions. Patient resting comfortably in bed, call bell within reach, bed alarm on, and patient instructed on how to use call bell.

## 2022-04-03 NOTE — Progress Notes (Signed)
Mobility Specialist Progress Note    04/03/22 1644  Mobility  Activity Ambulated with assistance in hallway  Level of Assistance Contact guard assist, steadying assist  Assistive Device Front wheel walker  Distance Ambulated (ft) 40 ft (20+20)  Activity Response Tolerated well  $Mobility charge 1 Mobility   Pt received sitting on computer chair in hall. Assisted pt back to chair in room. Left with call bell in reach and chair alarm on.   Hildred Alamin Mobility Specialist  Please Contact via SecureChat or Rehab Office at [phone]

## 2022-04-03 NOTE — H&P (Signed)
History and Physical    Mackenzie Peterson [id number] DOB: Jan 04, 1925 DOA: 04/02/2022  PCP: Clovia Cuff, MD  Patient coming from: Home  Chief Complaint: Diarrhea  HPI: Mackenzie Peterson is a 89 y.o. Spanish-speaking female with medical history significant of dementia, COPD, chronic hypoxemic respiratory failure on 2 L [place], hypertension, hyperlipidemia, hypothyroidism, PVD, anxiety, depression, GERD presented to the ED via EMS from home for evaluation of abdominal pain and nonbloody diarrhea after drinking a glass full of canola oil last night.  Vital signs on arrival to the ED: Temperature 98.2 F, pulse 88, respiratory rate 16, blood pressure 145/64, SpO2 96-98 % on room air.  Labs significant for WBC 13.7, hemoglobin 15.1, potassium 3.3, BUN 27, creatinine 0.7, lipase 67, normal LFTs.  UA with positive nitrite, trace leukocytes, and microscopy showing 6-10 WBCs and many bacteria.  CT abdomen pelvis negative for acute finding. EDP spoke to [facility] and informed that consuming large amounts of canola oil would not cause any toxicity except a laxative effect.  Recommended monitoring her electrolytes.  Patient was given ceftriaxone and 500 cc normal saline.  Spanish video interpreter used.  Patient is oriented to self only and very confused.  She is not able to give a meaningful history.  Resting comfortably.  Review of Systems:  Review of Systems  Reason unable to perform ROS: AMS.    Past Medical History:  Diagnosis Date   Alzheimer disease ([place])    Asthma    Chronic bronchitis ([place])    Dysthymic disorder    Emphysema    Hyperlipidemia    Hypertension    hypothyroid    PVD (peripheral vascular disease) ([place])     Past Surgical History:  Procedure Laterality Date   ANTERIOR APPROACH HEMI HIP ARTHROPLASTY Left 08/07/2021   Procedure: ANTERIOR APPROACH HEMI HIP ARTHROPLASTY;  Surgeon: Rod Can, MD;  Location: [facility];  Service: Orthopedics;  Laterality: Left;    INTRAMEDULLARY (IM) NAIL INTERTROCHANTERIC Right 11/28/2019   Procedure: INTRAMEDULLARY (IM) NAIL INTERTROCHANTRIC;  Surgeon: Nicholes Stairs, MD;  Location: [facility];  Service: Orthopedics;  Laterality: Right;     reports that she has never smoked. She has never used smokeless tobacco. She reports that she does not drink alcohol and does not use drugs.  No Known Allergies  History reviewed. No pertinent family history.  Prior to Admission medications   Medication Sig Start Date End Date Taking? Authorizing Provider  acetaminophen (TYLENOL) 500 MG tablet Take 500-1,000 mg by mouth every 6 (six) hours as needed for mild pain or headache.    [provider]  albuterol (PROVENTIL) (2.5 MG/3ML) 0.083% nebulizer solution Take 2.5 mg by nebulization 3 (three) times daily as needed for wheezing or shortness of breath.     [provider]  ALPRAZolam Duanne Moron) 0.5 MG tablet Take 1 tablet (0.5 mg total) by mouth 3 (three) times daily as needed for anxiety. Patient taking differently: Take 0.5 mg by mouth 2 (two) times daily. 12/04/19   Charlynne Cousins, MD  cetirizine (ZYRTEC) 10 MG tablet Take 10 mg by mouth at bedtime.    [provider]  docusate sodium (COLACE) 100 MG capsule Take 1 capsule (100 mg total) by mouth 2 (two) times daily. 08/09/21   [place] Eric J, DO  donepezil (ARICEPT) 10 MG tablet Take 10 mg by mouth at bedtime. 09/10/19   [provider]  levothyroxine (SYNTHROID, LEVOTHROID) 25 MCG tablet Take 25 mcg by mouth daily before breakfast.  [provider]  memantine (NAMENDA) 10 MG tablet Take 10 mg by mouth 2 (two) times daily.    [provider]  methocarbamol (ROBAXIN) 500 MG tablet Take 1 tablet (500 mg total) by mouth every 6 (six) hours as needed for muscle spasms. 08/09/21   [place] Donnamarie Poag, DO  Multiple Vitamins-Minerals (ONE-A-DAY PROACTIVE 65+) TABS Take 1 tablet by mouth daily with breakfast.    [provider]  nitroGLYCERIN (NITROSTAT) 0.4 MG SL tablet Place 0.4 mg under the tongue every 5 (five) minutes as needed for chest pain.     [provider]  omeprazole (PRILOSEC) 20 MG capsule TAKE 1 CAPSULE BY MOUTH 30 TO 60 MINUTES BEFORE THE FIRST AND LAST MEALS OF THE DAY Patient taking differently: Take 20 mg by mouth 2 (two) times daily before a meal.    Brand Males, MD  OXYGEN Inhale 2 L/min into the lungs at bedtime.    [provider]  polyethylene glycol (MIRALAX / GLYCOLAX) 17 g packet Take 17 g by mouth daily as needed for mild constipation. 08/09/21   [place] Eric J, DO  pravastatin (PRAVACHOL) 20 MG tablet Take 20 mg by mouth at bedtime. 04/17/19   [provider]  QUEtiapine (SEROQUEL) 100 MG tablet Take 100 mg by mouth 2 (two) times daily.    [provider]  senna (SENOKOT) 8.6 MG TABS tablet Take 1 tablet (8.6 mg total) by mouth 2 (two) times daily. 08/09/21   [place] Donnamarie Poag, DO  sertraline (ZOLOFT) 25 MG tablet Take 25 mg by mouth at bedtime. 04/20/19   [provider]  solifenacin (VESICARE) 5 MG tablet Take 5 mg by mouth at bedtime.     [provider]  traZODone (DESYREL) 50 MG tablet Take 50 mg by mouth at bedtime.    [provider]    Physical Exam: Vitals:   04/02/22 2230 04/02/22 2245 04/02/22 2300 04/02/22 2359  BP: 136/64 100/70 (!) 122/94 (!) 150/62  Pulse: 79 78 84 90  Resp: 15 (!) 26 (!) 27 19  Temp:    98.1 F (36.7 C)  TempSrc:    Oral  SpO2: 95% 94% 95% 96%  Weight:      Height:        Physical Exam Vitals reviewed.  Constitutional:      General: She is not in acute distress. HENT:     Head: Normocephalic and atraumatic.  Eyes:     Extraocular Movements: Extraocular movements intact.  Cardiovascular:     Rate and Rhythm: Normal rate and regular rhythm.     Pulses: Normal pulses.  Pulmonary:     Effort: Pulmonary effort is normal. No respiratory distress.     Breath sounds: Normal  breath sounds. No wheezing or rales.  Abdominal:     General: Bowel sounds are normal. There is no distension.     Palpations: Abdomen is soft.     Tenderness: There is abdominal tenderness. There is no guarding or rebound.     Comments: Generalized tenderness to palpation  Musculoskeletal:     Cervical back: Normal range of motion.     Right lower leg: No edema.     Left lower leg: No edema.  Skin:    General: Skin is warm and dry.  Neurological:     General: No focal deficit present.     Mental Status: She is alert.     Comments: Oriented to self only, very confused  Labs on Admission: I have personally reviewed following labs and imaging studies  CBC: Recent Labs  Lab 04/02/22 2155  WBC 13.7*  NEUTROABS 11.5*  HGB 15.1*  HCT 43.3  MCV 91.2  PLT 123456   Basic Metabolic Panel: Recent Labs  Lab 04/02/22 2155  NA 141  K 3.3*  CL 109  CO2 22  GLUCOSE 85  BUN 27*  CREATININE 0.73  CALCIUM 8.4*   GFR: Estimated Creatinine Clearance: 30.2 mL/min (by C-G formula based on SCr of 0.73 mg/dL). Liver Function Tests: Recent Labs  Lab 04/02/22 2155  AST 25  ALT 11  ALKPHOS 85  BILITOT 0.5  PROT 6.4*  ALBUMIN 3.3*   Recent Labs  Lab 04/02/22 2155  LIPASE 67*   No results for input(s): "AMMONIA" in the last 168 hours. Coagulation Profile: No results for input(s): "INR", "PROTIME" in the last 168 hours. Cardiac Enzymes: No results for input(s): "CKTOTAL", "CKMB", "CKMBINDEX", "TROPONINI" in the last 168 hours. BNP (last 3 results) No results for input(s): "PROBNP" in the last 8760 hours. HbA1C: No results for input(s): "HGBA1C" in the last 72 hours. CBG: No results for input(s): "GLUCAP" in the last 168 hours. Lipid Profile: No results for input(s): "CHOL", "HDL", "LDLCALC", "TRIG", "CHOLHDL", "LDLDIRECT" in the last 72 hours. Thyroid Function Tests: No results for input(s): "TSH", "T4TOTAL", "FREET4", "T3FREE", "THYROIDAB" in the last 72 hours. Anemia  Panel: No results for input(s): "VITAMINB12", "FOLATE", "FERRITIN", "TIBC", "IRON", "RETICCTPCT" in the last 72 hours. Urine analysis:    Component Value Date/Time   COLORURINE YELLOW 04/03/2022 0125   APPEARANCEUR HAZY (A) 04/03/2022 0125   LABSPEC >1.046 (H) 04/03/2022 0125   PHURINE 5.0 04/03/2022 0125   GLUCOSEU NEGATIVE 04/03/2022 0125   HGBUR NEGATIVE 04/03/2022 0125   BILIRUBINUR NEGATIVE 04/03/2022 0125   KETONESUR 20 (A) 04/03/2022 0125   PROTEINUR 30 (A) 04/03/2022 0125   UROBILINOGEN 0.2 06/17/2012 1645   NITRITE POSITIVE (A) 04/03/2022 0125   LEUKOCYTESUR TRACE (A) 04/03/2022 0125    Radiological Exams on Admission: CT ABDOMEN PELVIS W CONTRAST  Result Date: 04/03/2022 CLINICAL DATA:  Acute abdominal pain and diarrhea EXAM: CT ABDOMEN AND PELVIS WITH CONTRAST TECHNIQUE: Multidetector CT imaging of the abdomen and pelvis was performed using the standard protocol following bolus administration of intravenous contrast. RADIATION DOSE REDUCTION: This exam was performed according to the departmental dose-optimization program which includes automated exposure control, adjustment of the mA and/or kV according to patient size and/or use of iterative reconstruction technique. CONTRAST:  74m OMNIPAQUE IOHEXOL 350 MG/ML SOLN COMPARISON:  11/26/2019 FINDINGS: Lower chest: Lung bases demonstrate mild nodular changes stable in appearance when compared with the prior exam from 2021. Given the long-term stability this is likely postinflammatory in nature. No further follow-up is recommended. Hepatobiliary: No focal liver abnormality is seen. No gallstones, gallbladder wall thickening, or biliary dilatation. Pancreas: Unremarkable. No pancreatic ductal dilatation or surrounding inflammatory changes. Spleen: Normal in size without focal abnormality. Adrenals/Urinary Tract: Renal glands are within normal limits. Kidneys demonstrate a normal enhancement pattern bilaterally. No renal calculi are seen.  Normal excretion is noted on delayed images. The bladder is well distended. Stomach/Bowel: No obstructive or inflammatory changes of the colon are seen. The appendix is within normal limits. Small bowel and stomach are unremarkable. Vascular/Lymphatic: Aortic atherosclerosis. No enlarged abdominal or pelvic lymph nodes. Reproductive: Uterus is within normal limits. 3 cm simple appearing cyst in the right ovary is noted. Given the patient's age, no further follow-up is recommended. Other: No  abdominal wall hernia or abnormality. No abdominopelvic ascites. Musculoskeletal: Left hip prosthesis is seen. Postsurgical changes in the right hip are noted. Degenerative changes of lumbar spine are noted. No compression deformity is seen. IMPRESSION: Chronic nodular changes in the chest stable dating back to 2021. 3 cm simple appearing right ovarian cyst. No further follow-up is recommended. Electronically Signed   By: Inez Catalina M.D.   On: 04/03/2022 00:28    Assessment and Plan  Abdominal pain and diarrhea Likely due to laxative effect of canola oil.  Reportedly patient drank a glass full of canola oil last night.  C. difficile infection less likely as no recent antibiotic use reported.  WBC count mildly elevated and likely due to dehydration/hemoconcentration as hemoglobin and platelet count also increased from baseline.  CT without evidence of colitis.  Continue symptomatic management: IV fluid hydration, Imodium as needed.  Monitor CBC and BMP.  UTI UA with positive nitrite, trace leukocytes, and microscopy showing 6-10 WBCs and many bacteria.  WBC count mildly elevated, no fever or signs of sepsis.  Continue ceftriaxone.  Urine culture pending.  ?Acute metabolic encephalopathy versus ?advanced dementia Patient is currently oriented to self only and very confused.  She is not able to give a meaningful history.  Does have history of dementia but baseline unknown. No focal neurodeficit.  Confusion likely due to  UTI.  Stat CT head ordered and will also check TSH and ammonia levels.  Follow delirium precautions.  Mild hypokalemia Replace potassium and check magnesium level.  Continue to monitor electrolytes and replace as needed.  COPD Chronic hypoxemic respiratory failure on 2 L [place] Stable, no signs of acute exacerbation.  Currently satting well on room air.  Continue supplemental oxygen as needed.  Pharmacy med rec pending.  Hypertension Hyperlipidemia Hypothyroidism PVD Anxiety and depression GERD Pharmacy med rec pending.  DVT prophylaxis: SCDs Code Status: Full code by default.  Patient does not have capacity for decision-making, no surrogate or prior directive available.  Please readdress in the morning when family is available. Family Communication: No family available at this time. Level of care: Telemetry bed Admission status: It is my clinical opinion that referral for OBSERVATION is reasonable and necessary in this patient based on the above information provided. The aforementioned taken together are felt to place the patient at high risk for further clinical deterioration. However, it is anticipated that the patient may be medically stable for discharge from the hospital within 24 to 48 hours.   Shela Leff MD [facility]  If 7PM-7AM, please contact night-coverage [email or web address]  04/03/2022, 2:43 AM

## 2022-04-03 NOTE — Plan of Care (Signed)
  Problem: Education: Goal: Knowledge of General Education information will improve Description: Including pain rating scale, medication(s)/side effects and non-pharmacologic comfort measures 04/03/2022 1443 by Santa Lighter, RN Outcome: Adequate for Discharge 04/03/2022 1250 by Santa Lighter, RN Outcome: Progressing   Problem: Health Behavior/Discharge Planning: Goal: Ability to manage health-related needs will improve 04/03/2022 1443 by Santa Lighter, RN Outcome: Adequate for Discharge 04/03/2022 1250 by Santa Lighter, RN Outcome: Progressing   Problem: Clinical Measurements: Goal: Ability to maintain clinical measurements within normal limits will improve Outcome: Adequate for Discharge Goal: Will remain free from infection Outcome: Adequate for Discharge Goal: Diagnostic test results will improve Outcome: Adequate for Discharge Goal: Respiratory complications will improve Outcome: Adequate for Discharge Goal: Cardiovascular complication will be avoided Outcome: Adequate for Discharge   Problem: Activity: Goal: Risk for activity intolerance will decrease Outcome: Adequate for Discharge   Problem: Nutrition: Goal: Adequate nutrition will be maintained Outcome: Adequate for Discharge   Problem: Coping: Goal: Level of anxiety will decrease Outcome: Adequate for Discharge   Problem: Elimination: Goal: Will not experience complications related to bowel motility Outcome: Adequate for Discharge Goal: Will not experience complications related to urinary retention Outcome: Adequate for Discharge   Problem: Pain Managment: Goal: General experience of comfort will improve Outcome: Adequate for Discharge   Problem: Safety: Goal: Ability to remain free from injury will improve Outcome: Adequate for Discharge   Problem: Skin Integrity: Goal: Risk for impaired skin integrity will decrease Outcome: Adequate for Discharge

## 2022-04-03 NOTE — Progress Notes (Signed)
Removed IV-CDI. Wheeled stable patient and belongings to main entrance. Reviewed d/c paperwork with patient's daughter-in-law.

## 2022-04-03 NOTE — ED Notes (Signed)
Lab to draw mag ammonia cbc and bmp at 0500

## 2022-04-03 NOTE — Discharge Summary (Signed)
Physician Discharge Summary  Mackenzie Peterson [id number] DOB: Nov 14, 1924 DOA: 04/02/2022  PCP: Clovia Cuff, MD  Admit date: 04/02/2022 Discharge date: 04/03/2022  Admitted From: Home Discharge disposition: Home  Recommendations at discharge:  Continue oral hydration Oral Keflex for next 3 days.   Brief narrative: Mackenzie Peterson is a 89 y.o. female with PMH significant for dementia, HTN, HLD, PVD, hypothyroidism, COPD on 2 L, anxiety/depression, GERD 3/3, patient was brought to ED from home with complaint of diarrhea. Per EMS, patient got up in the middle of the night and drank unknown amount of cannula Mackenzie Peterson and had a couple bouts of diarrhea the next day and hence family called for her to be brought to the ED for evaluation.  In the ED, patient was hemodynamically stable Labs with WBC count 13.7 lipase 67, LFTs normal UA with positive nitrite, trace leukocytes, and microscopy showing 6-10 WBCs and many bacteria.   CT abdomen pelvis negative for acute finding.  CT head unremarkable. EDP spoke to [facility] and informed that consuming large amounts of canola oil would not cause any toxicity except having a a laxative effect.   Patient was given IV Rocephin and IV fluid  Kept in observation to monitor symptoms and electrolytes.   Subjective: Patient was seen and examined this afternoon.  Pleasant elderly [name].  Sitting up in chair.  Not in distress.  Her daughter-in-law/caregiver Mackenzie Peterson was on the phone who helped with interpretation. Chart reviewed. Afebrile, hemodynamically stable Labs this morning with potassium low at 3.2, WBC count normal  Assessment and plan: Abdominal pain and diarrhea Likely due to laxative effect of canola oil.  Reportedly patient drank a glass full of canola oil last night.  CT scan abdomen pelvis did not show any evidence of colitis. Patient had multiple episode of diarrhea at home and overnight in the hospital.  No diarrhea  since this morning.  Feels better.  Mild abdominal pain but overall improving.  Wants to go home. Recommend to continue oral hydration at home.  Hypokalemia Potassium low at 3.2 this morning.  Replacement given. Recent Labs  Lab 04/02/22 2155 04/03/22 0738  K 3.3* 3.2*  MG  --  1.8   UTI UA with positive nitrite, trace leukocytes, and microscopy showing 6-10 WBCs and many bacteria.   WBC count mildly elevated, no fever or signs of sepsis.  Currently on IV ceftriaxone.  Urine culture pending.  Past urine cultures reviewed.  Pansensitive E. coli noted.  I would switch to oral Keflex and discharged on 3 days of Keflex. Recent Labs  Lab 04/02/22 2155 04/03/22 0738  WBC 13.7* [id number]   Acute metabolic encephalopathy Advanced dementia Anxiety/depression Mental status slightly worse from baseline probably due to UTI.   Continue to monitor Continue Xanax as needed, Aricept, Namenda,   COPD Chronic hypoxemic respiratory failure on 2 L [place] Stable respiratory status. Continue bronchodilators as needed   Continue all other previous meds.  Wounds:  - Incision (Closed) 11/28/19 Hip Right (Active)  Date First Assessed/Time First Assessed: 11/28/19 1950   Location: Hip  Location Orientation: Right    Assessments 11/28/2019  8:15 PM 12/04/2019  8:29 PM  Dressing Type Gauze (Comment) Hydrocolloid  Dressing Clean;Dry;Intact Old drainage (marked)  Dressing Change Frequency -- Other (Comment)  Site / Wound Assessment Clean;Dry Dressing in place / Unable to assess  Closure Staples --  Drainage Amount None Scant     No associated orders.     Incision (Closed) 08/07/21 Hip Left (  Active)  Date First Assessed/Time First Assessed: 08/07/21 0906   Location: Hip  Location Orientation: Left    Assessments 08/07/2021  9:26 AM 08/09/2021 10:00 AM  Dressing Type -- Silver hydrofiber  Dressing Clean, Dry, Intact Clean, Dry, Intact  Site / Wound Assessment Dressing in place / Unable to assess Dressing  in place / Unable to assess  Drainage Amount None None     No associated orders.    Discharge Exam:   Vitals:   04/03/22 0330 04/03/22 0530 04/03/22 0928 04/03/22 1151  BP: (!) 145/62 (!) 152/72 (!) 143/67 (!) 128/52  Pulse: 77  88 80  Resp: '16 18 16 17  '$ Temp: 98.1 F (36.7 C) 98.6 F (37 C) 98 F (36.7 C) 97.7 F (36.5 C)  TempSrc: Oral Oral Oral Oral  SpO2: 97% 96% 94% 98%  Weight:      Height:        Body mass index is 22.46 kg/m.  General exam: Pleasant, not in distress Skin: No rashes, lesions or ulcers. HEENT: Atraumatic, normocephalic, no obvious bleeding Lungs: Clear to auscultation bilaterally CVS: Regular rate and rhythm, no murmur GI/Abd soft, mild tenderness, no distention, bowel sound present CNS: Alert, awake, oriented x 3 Psychiatry: Mood appropriate Extremities: No pedal edema, no calf tenderness  Follow ups:    Follow-up Information     Clovia Cuff, MD Follow up.   Specialty: Internal Medicine Contact information: [address] [place] [address] [phone]                 Discharge Instructions:   Discharge Instructions     Call MD for:  difficulty breathing, headache or visual disturbances   Complete by: As directed    Call MD for:  extreme fatigue   Complete by: As directed    Call MD for:  hives   Complete by: As directed    Call MD for:  persistant dizziness or light-headedness   Complete by: As directed    Call MD for:  persistant nausea and vomiting   Complete by: As directed    Call MD for:  severe uncontrolled pain   Complete by: As directed    Call MD for:  temperature >100.4   Complete by: As directed    Diet general   Complete by: As directed    Discharge instructions   Complete by: As directed    Recommendations at discharge:   Continue oral hydration  Oral Keflex for next 3 days.  General discharge instructions: Follow with Primary MD Clovia Cuff, MD in 7 days  Please request your PCP   to go over your hospital tests, procedures, radiology results at the follow up. Please get your medicines reviewed and adjusted.  Your PCP may decide to repeat certain labs or tests as needed. Do not drive, operate heavy machinery, perform activities at heights, swimming or participation in water activities or provide baby sitting services if your were admitted for syncope or siezures until you have seen by Primary MD or a Neurologist and advised to do so again. [place] Controlled Substance Reporting System database was reviewed. Do not drive, operate heavy machinery, perform activities at heights, swim, participate in water activities or provide baby-sitting services while on medications for pain, sleep and mood until your outpatient physician has reevaluated you and advised to do so again.  You are strongly recommended to comply with the dose, frequency and duration of prescribed medications. Activity: As tolerated with Full fall precautions  use walker/cane & assistance as needed Avoid using any recreational substances like cigarette, tobacco, alcohol, or non-prescribed drug. If you experience worsening of your admission symptoms, develop shortness of breath, life threatening emergency, suicidal or homicidal thoughts you must seek medical attention immediately by calling 911 or calling your MD immediately  if symptoms less severe. You must read complete instructions/literature along with all the possible adverse reactions/side effects for all the medicines you take and that have been prescribed to you. Take any new medicine only after you have completely understood and accepted all the possible adverse reactions/side effects.  Wear Seat belts while driving. You were cared for by a hospitalist during your hospital stay. If you have any questions about your discharge medications or the care you received while you were in the hospital after you are discharged, you can call the unit and ask to speak  with the hospitalist or the covering physician. Once you are discharged, your primary care physician will handle any further medical issues. Please note that NO REFILLS for any discharge medications will be authorized once you are discharged, as it is imperative that you return to your primary care physician (or establish a relationship with a primary care physician if you do not have one).   Increase activity slowly   Complete by: As directed        Discharge Medications:   Allergies as of 04/03/2022   No Known Allergies      Medication List     TAKE these medications    acetaminophen 500 MG tablet Commonly known as: TYLENOL Take 500-1,000 mg by mouth every 6 (six) hours as needed for mild pain or headache.   albuterol (2.5 MG/3ML) 0.083% nebulizer solution Commonly known as: PROVENTIL Take 2.5 mg by nebulization 3 (three) times daily as needed for wheezing or shortness of breath.   ALPRAZolam 0.5 MG tablet Commonly known as: XANAX Take 1 tablet (0.5 mg total) by mouth 3 (three) times daily as needed for anxiety. What changed: when to take this   cephALEXin 500 MG capsule Commonly known as: KEFLEX Take 1 capsule (500 mg total) by mouth every 8 (eight) hours for 3 days.   cetirizine 10 MG tablet Commonly known as: ZYRTEC Take 10 mg by mouth at bedtime.   docusate sodium 100 MG capsule Commonly known as: COLACE Take 1 capsule (100 mg total) by mouth 2 (two) times daily.   donepezil 10 MG tablet Commonly known as: ARICEPT Take 10 mg by mouth at bedtime.   levothyroxine 25 MCG tablet Commonly known as: SYNTHROID Take 25 mcg by mouth daily before breakfast.   memantine 10 MG tablet Commonly known as: NAMENDA Take 10 mg by mouth 2 (two) times daily.   methocarbamol 500 MG tablet Commonly known as: ROBAXIN Take 1 tablet (500 mg total) by mouth every 6 (six) hours as needed for muscle spasms.   nitroGLYCERIN 0.4 MG SL tablet Commonly known as: NITROSTAT Place 0.4  mg under the tongue every 5 (five) minutes as needed for chest pain.   omeprazole 20 MG capsule Commonly known as: PRILOSEC TAKE 1 CAPSULE BY MOUTH 30 TO 60 MINUTES BEFORE THE FIRST AND LAST MEALS OF THE DAY What changed: See the new instructions.   One-A-Day Proactive 65+ Tabs Take 1 tablet by mouth daily with breakfast.   OXYGEN Inhale 2 L/min into the lungs at bedtime.   polyethylene glycol 17 g packet Commonly known as: MIRALAX / GLYCOLAX Take 17 g by mouth daily as  needed for mild constipation.   pravastatin 20 MG tablet Commonly known as: PRAVACHOL Take 20 mg by mouth at bedtime.   QUEtiapine 100 MG tablet Commonly known as: SEROQUEL Take 100 mg by mouth 2 (two) times daily.   senna 8.6 MG Tabs tablet Commonly known as: SENOKOT Take 1 tablet (8.6 mg total) by mouth 2 (two) times daily.   sertraline 25 MG tablet Commonly known as: ZOLOFT Take 25 mg by mouth at bedtime.   solifenacin 5 MG tablet Commonly known as: VESICARE Take 5 mg by mouth at bedtime.   traZODone 50 MG tablet Commonly known as: DESYREL Take 50 mg by mouth at bedtime.         The results of significant diagnostics from this hospitalization (including imaging, microbiology, ancillary and laboratory) are listed below for reference.    Procedures and Diagnostic Studies:   CT HEAD WO CONTRAST (5MM)  Result Date: 04/03/2022 CLINICAL DATA:  Delirium EXAM: CT HEAD WITHOUT CONTRAST TECHNIQUE: Contiguous axial images were obtained from the base of the skull through the vertex without intravenous contrast. RADIATION DOSE REDUCTION: This exam was performed according to the departmental dose-optimization program which includes automated exposure control, adjustment of the mA and/or kV according to patient size and/or use of iterative reconstruction technique. COMPARISON:  11/28/2019 FINDINGS: Brain: No evidence of acute infarction, hemorrhage, hydrocephalus, extra-axial collection or mass lesion/mass  effect. Coarse calcification at the low left parietooccipital sulcus, stable and nonspecific in isolation. Mild for age cerebral volume loss and chronic small vessel disease. Vascular: No hyperdense vessel or unexpected calcification. Skull: Normal. Negative for fracture or focal lesion. Sinuses/Orbits: No acute finding. IMPRESSION: No acute or reversible finding. Electronically Signed   By: Jorje Guild M.D.   On: 04/03/2022 05:56   CT ABDOMEN PELVIS W CONTRAST  Result Date: 04/03/2022 CLINICAL DATA:  Acute abdominal pain and diarrhea EXAM: CT ABDOMEN AND PELVIS WITH CONTRAST TECHNIQUE: Multidetector CT imaging of the abdomen and pelvis was performed using the standard protocol following bolus administration of intravenous contrast. RADIATION DOSE REDUCTION: This exam was performed according to the departmental dose-optimization program which includes automated exposure control, adjustment of the mA and/or kV according to patient size and/or use of iterative reconstruction technique. CONTRAST:  37m OMNIPAQUE IOHEXOL 350 MG/ML SOLN COMPARISON:  11/26/2019 FINDINGS: Lower chest: Lung bases demonstrate mild nodular changes stable in appearance when compared with the prior exam from 2021. Given the long-term stability this is likely postinflammatory in nature. No further follow-up is recommended. Hepatobiliary: No focal liver abnormality is seen. No gallstones, gallbladder wall thickening, or biliary dilatation. Pancreas: Unremarkable. No pancreatic ductal dilatation or surrounding inflammatory changes. Spleen: Normal in size without focal abnormality. Adrenals/Urinary Tract: Renal glands are within normal limits. Kidneys demonstrate a normal enhancement pattern bilaterally. No renal calculi are seen. Normal excretion is noted on delayed images. The bladder is well distended. Stomach/Bowel: No obstructive or inflammatory changes of the colon are seen. The appendix is within normal limits. Small bowel and stomach  are unremarkable. Vascular/Lymphatic: Aortic atherosclerosis. No enlarged abdominal or pelvic lymph nodes. Reproductive: Uterus is within normal limits. 3 cm simple appearing cyst in the right ovary is noted. Given the patient's age, no further follow-up is recommended. Other: No abdominal wall hernia or abnormality. No abdominopelvic ascites. Musculoskeletal: Left hip prosthesis is seen. Postsurgical changes in the right hip are noted. Degenerative changes of lumbar spine are noted. No compression deformity is seen. IMPRESSION: Chronic nodular changes in the chest stable dating back to  2021. 3 cm simple appearing right ovarian cyst. No further follow-up is recommended. Electronically Signed   By: Inez Catalina M.D.   On: 04/03/2022 00:28     Labs:   Basic Metabolic Panel: Recent Labs  Lab 04/02/22 2155 04/03/22 0738  NA 141 143  K 3.3* 3.2*  CL 109 114*  CO2 22 18*  GLUCOSE 85 108*  BUN 27* 18  CREATININE 0.73 0.76  CALCIUM 8.4* 7.6*  MG  --  1.8   GFR Estimated Creatinine Clearance: 30.2 mL/min (by C-G formula based on SCr of 0.76 mg/dL). Liver Function Tests: Recent Labs  Lab 04/02/22 2155  AST 25  ALT 11  ALKPHOS 85  BILITOT 0.5  PROT 6.4*  ALBUMIN 3.3*   Recent Labs  Lab 04/02/22 2155  LIPASE 67*   Recent Labs  Lab 04/03/22 0933  AMMONIA 25   Coagulation profile No results for input(s): "INR", "PROTIME" in the last 168 hours.  CBC: Recent Labs  Lab 04/02/22 2155 04/03/22 0738  WBC 13.7* 10.3  NEUTROABS 11.5*  --   HGB 15.1* 14.7  HCT 43.3 44.4  MCV 91.2 95.1  PLT 273 254   Cardiac Enzymes: No results for input(s): "CKTOTAL", "CKMB", "CKMBINDEX", "TROPONINI" in the last 168 hours. BNP: Invalid input(s): "POCBNP" CBG: No results for input(s): "GLUCAP" in the last 168 hours. D-Dimer No results for input(s): "DDIMER" in the last 72 hours. Hgb A1c No results for input(s): "HGBA1C" in the last 72 hours. Lipid Profile No results for input(s): "CHOL",  "HDL", "LDLCALC", "TRIG", "CHOLHDL", "LDLDIRECT" in the last 72 hours. Thyroid function studies Recent Labs    04/02/22 2155  TSH 2.298   Anemia work up No results for input(s): "VITAMINB12", "FOLATE", "FERRITIN", "TIBC", "IRON", "RETICCTPCT" in the last 72 hours. Microbiology No results found for this or any previous visit (from the past 240 hour(s)).  Time coordinating discharge: 35 minutes  Signed: Vincy Feliz  [facility] 04/03/2022, 2:56 PM

## 2022-04-03 NOTE — ED Provider Notes (Signed)
Received at shift change from Sherrell Puller, PA-C please see note for full detail  In short patient with medical problems including dementia, COPD, incontinency, hypothyroidism, chronic respiratory therapy on 2 L via nasal cannula at nighttime presents with diarrhea, unclear of how long the diarrhea is been going on for but apparently patient had consumed at least a glass full of canola oil last night, and has been having worsening diarrhea since then.  There is no other complaints per the patient.  Per previous provider follow-up on CT scan and likely admission for observation.   Physical Exam  BP (!) 150/62 (BP Location: Right Arm)   Pulse 90   Temp 98.1 F (36.7 C) (Oral)   Resp 19   Ht 5' (1.524 m)   Wt 52.2 kg   SpO2 96%   BMI 22.46 kg/m   Physical Exam Vitals and nursing note reviewed.  Constitutional:      General: She is not in acute distress.    Appearance: Normal appearance. She is not ill-appearing or diaphoretic.  HENT:     Head: Normocephalic and atraumatic.     Nose: No congestion or rhinorrhea.  Eyes:     Conjunctiva/sclera: Conjunctivae normal.  Cardiovascular:     Rate and Rhythm: Normal rate and regular rhythm.  Pulmonary:     Effort: Pulmonary effort is normal.  Abdominal:     Palpations: Abdomen is soft.     Tenderness: There is no abdominal tenderness.  Musculoskeletal:     Cervical back: Neck supple.  Skin:    General: Skin is warm and dry.  Neurological:     Mental Status: She is alert.     Comments: No facial asymmetry, following two-step commands, no uvula weakness present.  Psychiatric:        Mood and Affect: Mood normal.     Procedures  Procedures  ED Course / MDM    Medical Decision Making Amount and/or Complexity of Data Reviewed Labs: ordered. Radiology: ordered.  Risk Prescription drug management. Decision regarding hospitalization.   Lab Tests:  I Ordered, and personally interpreted labs.  The pertinent results include:  CBC shows leukocytosis of 13.7, CMP shows potassium 3.3, BUN 27, lipase 67, UA shows nitrates, leukocytes, white blood cells many bacteria.   Imaging Studies ordered:  I ordered imaging studies including CT AP I independently visualized and interpreted imaging which showed negative acute findings I agree with the radiologist interpretation   Cardiac Monitoring:  The patient was maintained on a cardiac monitor.  I personally viewed and interpreted the cardiac monitored which showed an underlying rhythm of: N/A   Medicines ordered and prescription drug management:  I ordered medication including ceftriaxone I have reviewed the patients home medicines and have made adjustments as needed  Critical Interventions:  N/A   Reevaluation:  UA shows concerning signs for UTI, will start antibiotics, obtain urine cultures, admit to medicine for worsening weakness secondary due to diarrhea illness as well as UTI.  Consultations Obtained:  I requested consultation with the Dr. Levie Heritage,  and discussed lab and imaging findings as well as pertinent plan - they recommend: She will admit the patient.   Rule out Suspicion for CVA is low at this time no focal deficits present, she does have weakness but this is global most likely diagnosis secondary due to UTI as well as diarrheal illness.  Suspicion for pyelo or kidney stone is also low at this time she is nontoxic-appearing, she has no flank tenderness no CVA  tenderness.  Suspicion for C. difficile is also low at this time no recent antibiotic use, likely patient having consistent diarrhea from the amount of canola oil which is acting like a laxative.   Dispostion and problem list  After consideration of the diagnostic results and the patients response to treatment, I feel that the patent would benefit from admission.  Diarrheal illness-suspect this is likely induced from the canola oil, will need continued monitoring and likely is subtle  rehydration. UTI-urine cultures pending, started on ceftriaxone will need continued monitoring.           Marcello Fennel, PA-C 04/03/22 0217    Orpah Greek, MD 04/03/22 [phone]

## 2022-04-03 NOTE — ED Notes (Signed)
ED TO INPATIENT HANDOFF REPORT  ED Nurse Name and Phone #: [phone] Mackenzie Peterson  S Name/Age/Gender Mackenzie Peterson 89 y.o. female Room/Bed: 021C/021C  Code Status   Code Status: Prior  Home/SNF/Other Home Patient oriented to: self and place Is this baseline? Yes   Triage Complete: Triage complete  Chief Complaint Diarrhea  Triage Note No notes on file   Allergies No Known Allergies  Level of Care/Admitting Diagnosis ED Disposition     ED Disposition  Admit   Condition  --   Comment  The patient appears reasonably stabilized for admission considering the current resources, flow, and capabilities available in the ED at this time, and I doubt any other [facility] requiring further screening and/or treatment in the ED prior to admission is  present.          B Medical/Surgery History Past Medical History:  Diagnosis Date   Alzheimer disease ([place])    Asthma    Chronic bronchitis ([place])    Dysthymic disorder    Emphysema    Hyperlipidemia    Hypertension    hypothyroid    PVD (peripheral vascular disease) ([place])    Past Surgical History:  Procedure Laterality Date   ANTERIOR APPROACH HEMI HIP ARTHROPLASTY Left 08/07/2021   Procedure: ANTERIOR APPROACH HEMI HIP ARTHROPLASTY;  Surgeon: Rod Can, MD;  Location: [facility];  Service: Orthopedics;  Laterality: Left;   INTRAMEDULLARY (IM) NAIL INTERTROCHANTERIC Right 11/28/2019   Procedure: INTRAMEDULLARY (IM) NAIL INTERTROCHANTRIC;  Surgeon: Nicholes Stairs, MD;  Location: [place];  Service: Orthopedics;  Laterality: Right;     A IV Location/Drains/Wounds Patient Lines/Drains/Airways Status     Active Line/Drains/Airways     Name Placement date Placement time Site Days   Peripheral IV 04/02/22 22 G Anterior;Right Forearm 04/02/22  2220  Forearm  1   Incision (Closed) 11/28/19 Hip Right 11/28/19  1950  -- 857   Incision (Closed) 08/07/21 Hip Left 08/07/21  0906  -- 239            Intake/Output Last 24  hours  Intake/Output Summary (Last 24 hours) at 04/03/2022 0200 Last data filed at 04/02/2022 2319 Gross per 24 hour  Intake 500 ml  Output --  Net 500 ml    Labs/Imaging Results for orders placed or performed during the hospital encounter of 04/02/22 (from the past 48 hour(s))  CBC with Differential     Status: Abnormal   Collection Time: 04/02/22  9:55 PM  Result Value Ref Range   WBC 13.7 (H) 4.0 - 10.5 K/uL   RBC 4.75 3.87 - 5.11 MIL/uL   Hemoglobin 15.1 (H) 12.0 - 15.0 g/dL   HCT 43.3 36.0 - 46.0 %   MCV 91.2 80.0 - 100.0 fL   MCH 31.8 26.0 - 34.0 pg   MCHC 34.9 30.0 - 36.0 g/dL   RDW 13.6 11.5 - 15.5 %   Platelets 273 150 - 400 K/uL   nRBC 0.0 0.0 - 0.2 %   Neutrophils Relative % 85 %   Neutro Abs 11.5 (H) 1.7 - 7.7 K/uL   Lymphocytes Relative 9 %   Lymphs Abs 1.2 0.7 - 4.0 K/uL   Monocytes Relative 6 %   Monocytes Absolute 0.9 0.1 - 1.0 K/uL   Eosinophils Relative 0 %   Eosinophils Absolute 0.0 0.0 - 0.5 K/uL   Basophils Relative 0 %   Basophils Absolute 0.0 0.0 - 0.1 K/uL   Immature Granulocytes 0 %   Abs Immature Granulocytes 0.06 0.00 -  0.07 K/uL    Comment: Performed at [facility], [place] [address], [place], [place] [address]  Comprehensive metabolic panel     Status: Abnormal   Collection Time: 04/02/22  9:55 PM  Result Value Ref Range   Sodium 141 135 - 145 mmol/L   Potassium 3.3 (L) 3.5 - 5.1 mmol/L    Comment: HEMOLYSIS AT THIS LEVEL MAY AFFECT RESULT   Chloride 109 98 - 111 mmol/L   CO2 22 22 - 32 mmol/L   Glucose, Bld 85 70 - 99 mg/dL    Comment: Glucose reference range applies only to samples taken after fasting for at least 8 hours.   BUN 27 (H) 8 - 23 mg/dL   Creatinine, Ser 0.73 0.44 - 1.00 mg/dL   Calcium 8.4 (L) 8.9 - 10.3 mg/dL   Total Protein 6.4 (L) 6.5 - 8.1 g/dL   Albumin 3.3 (L) 3.5 - 5.0 g/dL   AST 25 15 - 41 U/L    Comment: HEMOLYSIS AT THIS LEVEL MAY AFFECT RESULT   ALT 11 0 - 44 U/L    Comment: HEMOLYSIS AT THIS LEVEL MAY  AFFECT RESULT   Alkaline Phosphatase 85 38 - 126 U/L   Total Bilirubin 0.5 0.3 - 1.2 mg/dL    Comment: HEMOLYSIS AT THIS LEVEL MAY AFFECT RESULT   GFR, Estimated >60 >60 mL/min    Comment: (NOTE) Calculated using the CKD-EPI Creatinine Equation (2021)    Anion gap 10 5 - 15    Comment: Performed at [facility], [place] [address], [place], [place] [address]  Lipase, blood     Status: Abnormal   Collection Time: 04/02/22  9:55 PM  Result Value Ref Range   Lipase 67 (H) 11 - 51 U/L    Comment: Performed at [facility] [address] [place], [place] [address]  Urinalysis, w/ Reflex to Culture (Infection Suspected) -Urine, Clean Catch     Status: Abnormal   Collection Time: 04/03/22  1:25 AM  Result Value Ref Range   Specimen Source URINE, CLEAN CATCH    Color, Urine YELLOW YELLOW   APPearance HAZY (A) CLEAR   Specific Gravity, Urine >1.046 (H) 1.005 - 1.030   pH 5.0 5.0 - 8.0   Glucose, UA NEGATIVE NEGATIVE mg/dL   Hgb urine dipstick NEGATIVE NEGATIVE   Bilirubin Urine NEGATIVE NEGATIVE   Ketones, ur 20 (A) NEGATIVE mg/dL   Protein, ur 30 (A) NEGATIVE mg/dL   Nitrite POSITIVE (A) NEGATIVE   Leukocytes,Ua TRACE (A) NEGATIVE   RBC / HPF 0-5 0 - 5 RBC/hpf   WBC, UA 6-10 0 - 5 WBC/hpf    Comment:        Reflex urine culture not performed if WBC <=10, OR if Squamous epithelial cells >5. If Squamous epithelial cells >5 suggest recollection.    Bacteria, UA MANY (A) NONE SEEN   Squamous Epithelial / HPF 0-5 0 - 5 /HPF    Comment: Performed at [facility], [place] [address], [place], [place] [address]   CT ABDOMEN PELVIS W CONTRAST  Result Date: 04/03/2022 CLINICAL DATA:  Acute abdominal pain and diarrhea EXAM: CT ABDOMEN AND PELVIS WITH CONTRAST TECHNIQUE: Multidetector CT imaging of the abdomen and pelvis was performed using the standard protocol following bolus administration of intravenous contrast. RADIATION DOSE REDUCTION: This exam was performed according to  the departmental dose-optimization program which includes automated exposure control, adjustment of the mA and/or kV according to patient size  and/or use of iterative reconstruction technique. CONTRAST:  50m OMNIPAQUE IOHEXOL 350 MG/ML SOLN COMPARISON:  11/26/2019 FINDINGS: Lower chest: Lung bases demonstrate mild nodular changes stable in appearance when compared with the prior exam from 2021. Given the long-term stability this is likely postinflammatory in nature. No further follow-up is recommended. Hepatobiliary: No focal liver abnormality is seen. No gallstones, gallbladder wall thickening, or biliary dilatation. Pancreas: Unremarkable. No pancreatic ductal dilatation or surrounding inflammatory changes. Spleen: Normal in size without focal abnormality. Adrenals/Urinary Tract: Renal glands are within normal limits. Kidneys demonstrate a normal enhancement pattern bilaterally. No renal calculi are seen. Normal excretion is noted on delayed images. The bladder is well distended. Stomach/Bowel: No obstructive or inflammatory changes of the colon are seen. The appendix is within normal limits. Small bowel and stomach are unremarkable. Vascular/Lymphatic: Aortic atherosclerosis. No enlarged abdominal or pelvic lymph nodes. Reproductive: Uterus is within normal limits. 3 cm simple appearing cyst in the right ovary is noted. Given the patient's age, no further follow-up is recommended. Other: No abdominal wall hernia or abnormality. No abdominopelvic ascites. Musculoskeletal: Left hip prosthesis is seen. Postsurgical changes in the right hip are noted. Degenerative changes of lumbar spine are noted. No compression deformity is seen. IMPRESSION: Chronic nodular changes in the chest stable dating back to 2021. 3 cm simple appearing right ovarian cyst. No further follow-up is recommended. Electronically Signed   By: MInez CatalinaM.D.   On: 04/03/2022 00:28    Pending Labs Unresulted Labs (From admission, onward)      Start     Ordered   04/03/22 0155  Urine Culture (for pregnant, neutropenic or urologic patients or patients with an indwelling urinary catheter)  (Urine Labs)  Once,   URGENT       Question:  Indication  Answer:  Urgency/frequency   04/03/22 0155            Vitals/Pain Today's Vitals   04/02/22 2230 04/02/22 2245 04/02/22 2300 04/02/22 2359  BP: 136/64 100/70 (!) 122/94 (!) 150/62  Pulse: 79 78 84 90  Resp: 15 (!) 26 (!) 27 19  Temp:    98.1 F (36.7 C)  TempSrc:    Oral  SpO2: 95% 94% 95% 96%  Weight:      Height:      PainSc:        Isolation Precautions No active isolations  Medications Medications  cefTRIAXone (ROCEPHIN) 1 g in sodium chloride 0.9 % 100 mL IVPB (has no administration in time range)  sodium chloride 0.9 % bolus 500 mL (0 mLs Intravenous Stopped 04/02/22 2319)  iohexol (OMNIPAQUE) 350 MG/ML injection 50 mL (50 mLs Intravenous Contrast Given 04/03/22 0016)    Mobility walks with device     Focused Assessments UTI   R Recommendations: See Admitting Provider Note  Report given to:   Additional Notes:

## 2022-09-29 ENCOUNTER — Emergency Department (HOSPITAL_COMMUNITY): Payer: Medicare Other

## 2022-09-29 ENCOUNTER — Other Ambulatory Visit: Payer: Self-pay

## 2022-09-29 ENCOUNTER — Inpatient Hospital Stay (HOSPITAL_COMMUNITY)
Admission: EM | Admit: 2022-09-29 | Discharge: 2022-10-04 | DRG: 193 | Disposition: A | Discharge status: Skilled Nursing Facility | Payer: Medicare Other | Attending: Internal Medicine | Admitting: Internal Medicine

## 2022-09-29 ENCOUNTER — Encounter (HOSPITAL_COMMUNITY): Payer: Self-pay

## 2022-09-29 DIAGNOSIS — E860 Dehydration: Secondary | ICD-10-CM | POA: Diagnosis present

## 2022-09-29 DIAGNOSIS — E8809 Other disorders of plasma-protein metabolism, not elsewhere classified: Secondary | ICD-10-CM | POA: Diagnosis present

## 2022-09-29 DIAGNOSIS — Z1152 Encounter for screening for COVID-19: Secondary | ICD-10-CM

## 2022-09-29 DIAGNOSIS — J439 Emphysema, unspecified: Secondary | ICD-10-CM | POA: Diagnosis present

## 2022-09-29 DIAGNOSIS — I4510 Unspecified right bundle-branch block: Secondary | ICD-10-CM | POA: Diagnosis present

## 2022-09-29 DIAGNOSIS — J159 Unspecified bacterial pneumonia: Secondary | ICD-10-CM | POA: Diagnosis not present

## 2022-09-29 DIAGNOSIS — R6510 Systemic inflammatory response syndrome (SIRS) of non-infectious origin without acute organ dysfunction: Secondary | ICD-10-CM

## 2022-09-29 DIAGNOSIS — Z96642 Presence of left artificial hip joint: Secondary | ICD-10-CM | POA: Diagnosis present

## 2022-09-29 DIAGNOSIS — K219 Gastro-esophageal reflux disease without esophagitis: Secondary | ICD-10-CM | POA: Diagnosis present

## 2022-09-29 DIAGNOSIS — Z79899 Other long term (current) drug therapy: Secondary | ICD-10-CM

## 2022-09-29 DIAGNOSIS — I10 Essential (primary) hypertension: Secondary | ICD-10-CM | POA: Diagnosis present

## 2022-09-29 DIAGNOSIS — R9431 Abnormal electrocardiogram [ECG] [EKG]: Secondary | ICD-10-CM | POA: Diagnosis present

## 2022-09-29 DIAGNOSIS — E1151 Type 2 diabetes mellitus with diabetic peripheral angiopathy without gangrene: Secondary | ICD-10-CM | POA: Diagnosis present

## 2022-09-29 DIAGNOSIS — Z79890 Hormone replacement therapy (postmenopausal): Secondary | ICD-10-CM

## 2022-09-29 DIAGNOSIS — G9341 Metabolic encephalopathy: Secondary | ICD-10-CM | POA: Diagnosis present

## 2022-09-29 DIAGNOSIS — R000 Tachycardia, unspecified: Secondary | ICD-10-CM | POA: Diagnosis present

## 2022-09-29 DIAGNOSIS — N179 Acute kidney failure, unspecified: Secondary | ICD-10-CM | POA: Diagnosis present

## 2022-09-29 DIAGNOSIS — E875 Hyperkalemia: Secondary | ICD-10-CM | POA: Diagnosis present

## 2022-09-29 DIAGNOSIS — E785 Hyperlipidemia, unspecified: Secondary | ICD-10-CM | POA: Diagnosis present

## 2022-09-29 DIAGNOSIS — F0390 Unspecified dementia without behavioral disturbance: Secondary | ICD-10-CM | POA: Diagnosis present

## 2022-09-29 DIAGNOSIS — F0280 Dementia in other diseases classified elsewhere without behavioral disturbance: Secondary | ICD-10-CM | POA: Diagnosis present

## 2022-09-29 DIAGNOSIS — J440 Chronic obstructive pulmonary disease with acute lower respiratory infection: Secondary | ICD-10-CM | POA: Diagnosis present

## 2022-09-29 DIAGNOSIS — Z603 Acculturation difficulty: Secondary | ICD-10-CM | POA: Diagnosis present

## 2022-09-29 DIAGNOSIS — E872 Acidosis, unspecified: Secondary | ICD-10-CM | POA: Diagnosis present

## 2022-09-29 DIAGNOSIS — E876 Hypokalemia: Secondary | ICD-10-CM | POA: Diagnosis present

## 2022-09-29 DIAGNOSIS — G309 Alzheimer's disease, unspecified: Secondary | ICD-10-CM | POA: Diagnosis present

## 2022-09-29 DIAGNOSIS — J9611 Chronic respiratory failure with hypoxia: Secondary | ICD-10-CM | POA: Diagnosis present

## 2022-09-29 DIAGNOSIS — Z7189 Other specified counseling: Secondary | ICD-10-CM

## 2022-09-29 DIAGNOSIS — Z515 Encounter for palliative care: Secondary | ICD-10-CM

## 2022-09-29 DIAGNOSIS — E8770 Fluid overload, unspecified: Secondary | ICD-10-CM | POA: Diagnosis present

## 2022-09-29 DIAGNOSIS — Z7982 Long term (current) use of aspirin: Secondary | ICD-10-CM

## 2022-09-29 DIAGNOSIS — J441 Chronic obstructive pulmonary disease with (acute) exacerbation: Secondary | ICD-10-CM | POA: Diagnosis present

## 2022-09-29 DIAGNOSIS — J189 Pneumonia, unspecified organism: Principal | ICD-10-CM | POA: Diagnosis present

## 2022-09-29 DIAGNOSIS — E039 Hypothyroidism, unspecified: Secondary | ICD-10-CM | POA: Diagnosis present

## 2022-09-29 DIAGNOSIS — F419 Anxiety disorder, unspecified: Secondary | ICD-10-CM | POA: Diagnosis present

## 2022-09-29 DIAGNOSIS — Z9981 Dependence on supplemental oxygen: Secondary | ICD-10-CM

## 2022-09-29 LAB — I-STAT VENOUS BLOOD GAS, ED
Acid-Base Excess: 2 mmol/L (ref 0.0–2.0)
Bicarbonate: 25.2 mmol/L (ref 20.0–28.0)
Calcium, Ion: 0.98 mmol/L — ABNORMAL LOW (ref 1.15–1.40)
HCT: 37 % (ref 36.0–46.0)
Hemoglobin: 12.6 g/dL (ref 12.0–15.0)
O2 Saturation: 99 %
Potassium: 4.5 mmol/L (ref 3.5–5.1)
Sodium: 139 mmol/L (ref 135–145)
TCO2: 26 mmol/L (ref 22–32)
pCO2, Ven: 35.2 mmHg — ABNORMAL LOW (ref 44–60)
pH, Ven: 7.463 — ABNORMAL HIGH (ref 7.25–7.43)
pO2, Ven: 112 mmHg — ABNORMAL HIGH (ref 32–45)

## 2022-09-29 LAB — COMPREHENSIVE METABOLIC PANEL
ALT: 10 U/L (ref 0–44)
AST: 26 U/L (ref 15–41)
Albumin: 2.8 g/dL — ABNORMAL LOW (ref 3.5–5.0)
Alkaline Phosphatase: 66 U/L (ref 38–126)
Anion gap: 12 (ref 5–15)
BUN: 30 mg/dL — ABNORMAL HIGH (ref 8–23)
CO2: 20 mmol/L — ABNORMAL LOW (ref 22–32)
Calcium: 7.6 mg/dL — ABNORMAL LOW (ref 8.9–10.3)
Chloride: 103 mmol/L (ref 98–111)
Creatinine, Ser: 1 mg/dL (ref 0.44–1.00)
GFR, Estimated: 52 mL/min — ABNORMAL LOW (ref >60)
Glucose, Bld: 97 mg/dL (ref 70–99)
Potassium: 5.3 mmol/L — ABNORMAL HIGH (ref 3.5–5.1)
Sodium: 135 mmol/L (ref 135–145)
Total Bilirubin: 1.1 mg/dL (ref 0.3–1.2)
Total Protein: 5.7 g/dL — ABNORMAL LOW (ref 6.5–8.1)

## 2022-09-29 LAB — CBC WITH DIFFERENTIAL/PLATELET
Abs Immature Granulocytes: 0.06 10*3/uL (ref 0.00–0.07)
Basophils Absolute: 0.1 10*3/uL (ref 0.0–0.1)
Basophils Relative: 0 %
Eosinophils Absolute: 0.4 10*3/uL (ref 0.0–0.5)
Eosinophils Relative: 3 %
HCT: 38 % (ref 36.0–46.0)
Hemoglobin: 12.5 g/dL (ref 12.0–15.0)
Immature Granulocytes: 0 %
Lymphocytes Relative: 10 %
Lymphs Abs: 1.5 10*3/uL (ref 0.7–4.0)
MCH: 31.5 pg (ref 26.0–34.0)
MCHC: 32.9 g/dL (ref 30.0–36.0)
MCV: 95.7 fL (ref 80.0–100.0)
Monocytes Absolute: 1.3 10*3/uL — ABNORMAL HIGH (ref 0.1–1.0)
Monocytes Relative: 9 %
Neutro Abs: 11.1 10*3/uL — ABNORMAL HIGH (ref 1.7–7.7)
Neutrophils Relative %: 78 %
Platelets: 197 10*3/uL (ref 150–400)
RBC: 3.97 MIL/uL (ref 3.87–5.11)
RDW: 14.6 % (ref 11.5–15.5)
WBC: 14.4 10*3/uL — ABNORMAL HIGH (ref 4.0–10.5)
nRBC: 0 % (ref 0.0–0.2)

## 2022-09-29 LAB — RESP PANEL BY RT-PCR (RSV, FLU A&B, COVID)  RVPGX2
Influenza A by PCR: NEGATIVE
Influenza B by PCR: NEGATIVE
Resp Syncytial Virus by PCR: NEGATIVE
SARS Coronavirus 2 by RT PCR: NEGATIVE

## 2022-09-29 LAB — LACTIC ACID, PLASMA
Lactic Acid, Venous: 0.8 mmol/L (ref 0.5–1.9)
Lactic Acid, Venous: 1.3 mmol/L (ref 0.5–1.9)

## 2022-09-29 MED ORDER — SODIUM CHLORIDE 0.9 % IV SOLN
1.0000 g | Freq: Once | INTRAVENOUS | Status: AC
  Administered 2022-09-29: 1 g via INTRAVENOUS
  Filled 2022-09-29: qty 10

## 2022-09-29 MED ORDER — IPRATROPIUM-ALBUTEROL 0.5-2.5 (3) MG/3ML IN SOLN
3.0000 mL | Freq: Once | RESPIRATORY_TRACT | Status: AC
  Administered 2022-09-29: 3 mL via RESPIRATORY_TRACT
  Filled 2022-09-29: qty 3

## 2022-09-29 MED ORDER — SODIUM CHLORIDE 0.9 % IV SOLN
500.0000 mg | Freq: Once | INTRAVENOUS | Status: AC
  Administered 2022-09-29: 500 mg via INTRAVENOUS
  Filled 2022-09-29: qty 5

## 2022-09-29 NOTE — ED Notes (Signed)
Patient removing O2 frequently, and was removing neb mask when it was giving neb. Called patient's daughter who EMS stated was POA and was coming to ED< but she stated [place] and nobody is available to come sit with patient.

## 2022-09-29 NOTE — ED Provider Notes (Signed)
[place] EMERGENCY DEPARTMENT AT [facility] Provider Note   CSN: [id number] Arrival date & time: 09/29/22  1912     History {Add pertinent medical, surgical, social history, OB history to HPI:1} No chief complaint on file.   Mackenzie Peterson is a 89 y.o. female.  HPI     Home Medications Prior to Admission medications   Medication Sig Start Date End Date Taking? Authorizing Provider  acetaminophen (TYLENOL) 500 MG tablet Take 500-1,000 mg by mouth every 6 (six) hours as needed for mild pain or headache.    [provider]  albuterol (PROVENTIL) (2.5 MG/3ML) 0.083% nebulizer solution Take 2.5 mg by nebulization 3 (three) times daily as needed for wheezing or shortness of breath.     [provider]  ALPRAZolam Prudy Feeler) 0.5 MG tablet Take 1 tablet (0.5 mg total) by mouth 3 (three) times daily as needed for anxiety. Patient taking differently: Take 0.5 mg by mouth 2 (two) times daily. 12/04/19   Marinda Elk, MD  cetirizine (ZYRTEC) 10 MG tablet Take 10 mg by mouth at bedtime.    [provider]  docusate sodium (COLACE) 100 MG capsule Take 1 capsule (100 mg total) by mouth 2 (two) times daily. 08/09/21   [place], Eric J, DO  donepezil (ARICEPT) 10 MG tablet Take 10 mg by mouth at bedtime. 09/10/19   [provider]  levothyroxine (SYNTHROID, LEVOTHROID) 25 MCG tablet Take 25 mcg by mouth daily before breakfast.     [provider]  memantine (NAMENDA) 10 MG tablet Take 10 mg by mouth 2 (two) times daily.    [provider]  methocarbamol (ROBAXIN) 500 MG tablet Take 1 tablet (500 mg total) by mouth every 6 (six) hours as needed for muscle spasms. 08/09/21   [place], Alvira Philips, DO  Multiple Vitamins-Minerals (ONE-A-DAY PROACTIVE 65+) TABS Take 1 tablet by mouth daily with breakfast.    [provider]  nitroGLYCERIN (NITROSTAT) 0.4 MG SL tablet Place 0.4 mg under the tongue every 5 (five) minutes as needed for  chest pain.     [provider]  omeprazole (PRILOSEC) 20 MG capsule TAKE 1 CAPSULE BY MOUTH 30 TO 60 MINUTES BEFORE THE FIRST AND LAST MEALS OF THE DAY Patient taking differently: Take 20 mg by mouth 2 (two) times daily before a meal.    Kalman Shan, MD  OXYGEN Inhale 2 L/min into the lungs at bedtime.    [provider]  polyethylene glycol (MIRALAX / GLYCOLAX) 17 g packet Take 17 g by mouth daily as needed for mild constipation. 08/09/21   [place], Eric J, DO  pravastatin (PRAVACHOL) 20 MG tablet Take 20 mg by mouth at bedtime. 04/17/19   [provider]  QUEtiapine (SEROQUEL) 100 MG tablet Take 100 mg by mouth 2 (two) times daily.    [provider]  senna (SENOKOT) 8.6 MG TABS tablet Take 1 tablet (8.6 mg total) by mouth 2 (two) times daily. 08/09/21   [place], Alvira Philips, DO  sertraline (ZOLOFT) 25 MG tablet Take 25 mg by mouth at bedtime. 04/20/19   [provider]  solifenacin (VESICARE) 5 MG tablet Take 5 mg by mouth at bedtime.     [provider]  traZODone (DESYREL) 50 MG tablet Take 50 mg by mouth at bedtime.    [provider]      Allergies    Patient has no known allergies.    Review of Systems   Review of Systems  Physical Exam Updated Vital Signs There were no vitals taken for this visit. Physical Exam  ED Results / Procedures / Treatments   Labs (all labs ordered are listed, but only abnormal results are displayed) Labs Reviewed - No data to display  EKG None  Radiology No results found.  Procedures Procedures  {Document cardiac monitor, telemetry assessment procedure when appropriate:1}  Medications Ordered in ED Medications - No data to display  ED Course/ Medical Decision Making/ A&P   {   Click here for ABCD2, HEART and other calculatorsREFRESH Note before signing :1}                              Medical Decision Making  ***  {Document critical care time when  appropriate:1} {Document review of labs and clinical decision tools ie heart score, Chads2Vasc2 etc:1}  {Document your independent review of radiology images, and any outside records:1} {Document your discussion with family members, caretakers, and with consultants:1} {Document social determinants of health affecting pt's care:1} {Document your decision making why or why not admission, treatments were needed:1} Final Clinical Impression(s) / ED Diagnoses Final diagnoses:  None    Rx / DC Orders ED Discharge Orders     None

## 2022-09-29 NOTE — ED Triage Notes (Signed)
Pt arrives to ED c/o cough and fever x 1 day. Pt w/ recent hx of COPD. Family is concerned for pneumonia. Pt reported to have diminished lung sounds. O2 at 96% on 2L which is baseline. Pt speaks Spanish and with dementia at baseline.

## 2022-09-29 NOTE — H&P (Incomplete)
History and Physical    Mackenzie Peterson MVH:[id number] DOB: November 19, 1924 DOA: 09/29/2022  PCP: [name], MD  Patient coming from: Home  Chief Complaint: Cough, fever  HPI: Mackenzie Peterson is a 89 y.o. Spanish-speaking female with medical history significant of advanced Alzheimer's dementia, COPD, chronic hypoxemic respiratory failure on 2 L [place], hypertension, hyperlipidemia, hypothyroidism, PVD, anxiety, depression, GERD presents to the ED for evaluation of cough and fever x 1 day.  Patient is not able to give any history given advanced dementia, language barrier, and she is very hard of hearing.  Tried using Spanish video interpreter.  No family available at this time. In the ED, oxygen saturation 96% on 2 L on arrival.  Tachypneic to the 20s and slightly tachycardic. Afebrile.  Labs notable for WBC 14.4, potassium 5.3, bicarb 20, glucose 97, BUN 30, creatinine 1.0, calcium 8.6 when corrected for hypoalbuminemia, COVID/influenza/RSV PCR negative, VBG showing pH 7.46 and pCO2 35.2, lactic acid normal x 2, blood cultures collected.  Chest x-ray showing extensive nodular opacities in the lungs bilaterally, increase in size and number from prior exams dating back to 2011, superimposed infiltrate not excluded. Patient was given DuoNeb, ceftriaxone, and azithromycin.  Review of Systems:  ROS  Past Medical History:  Diagnosis Date   Alzheimer disease ([facility])    Asthma    Chronic bronchitis ([facility])    Dysthymic disorder    Emphysema    Hyperlipidemia    Hypertension    hypothyroid    PVD (peripheral vascular disease) ([facility])     Past Surgical History:  Procedure Laterality Date   ANTERIOR APPROACH HEMI HIP ARTHROPLASTY Left 08/07/2021   Procedure: ANTERIOR APPROACH HEMI HIP ARTHROPLASTY;  Surgeon: Samson Frederic, MD;  Location: [facility];  Service: Orthopedics;  Laterality: Left;   INTRAMEDULLARY (IM) NAIL INTERTROCHANTERIC Right 11/28/2019   Procedure: INTRAMEDULLARY (IM) NAIL INTERTROCHANTRIC;   Surgeon: Yolonda Kida, MD;  Location: [facility] OR;  Service: Orthopedics;  Laterality: Right;     reports that she has never smoked. She has never used smokeless tobacco. She reports that she does not drink alcohol and does not use drugs.  No Known Allergies  History reviewed. No pertinent family history.  Prior to Admission medications   Medication Sig Start Date End Date Taking? Authorizing Provider  acetaminophen (TYLENOL) 500 MG tablet Take 500-1,000 mg by mouth every 6 (six) hours as needed for mild pain or headache.    [provider]  albuterol (PROVENTIL) (2.5 MG/3ML) 0.083% nebulizer solution Take 2.5 mg by nebulization 3 (three) times daily as needed for wheezing or shortness of breath.     [provider]  ALPRAZolam Prudy Feeler) 0.5 MG tablet Take 1 tablet (0.5 mg total) by mouth 3 (three) times daily as needed for anxiety. Patient taking differently: Take 0.5 mg by mouth 2 (two) times daily. 12/04/19   Marinda Elk, MD  cetirizine (ZYRTEC) 10 MG tablet Take 10 mg by mouth at bedtime.    [provider]  docusate sodium (COLACE) 100 MG capsule Take 1 capsule (100 mg total) by mouth 2 (two) times daily. 08/09/21   [place], Eric J, DO  donepezil (ARICEPT) 10 MG tablet Take 10 mg by mouth at bedtime. 09/10/19   [provider]  levothyroxine (SYNTHROID, LEVOTHROID) 25 MCG tablet Take 25 mcg by mouth daily before breakfast.     [provider]  memantine (NAMENDA) 10 MG tablet Take 10 mg by mouth 2 (two) times daily.    [provider]  methocarbamol (ROBAXIN) 500 MG tablet Take 1 tablet (500 mg total) by mouth every 6 (six) hours as needed for muscle spasms. 08/09/21   [place], Alvira Philips, DO  Multiple Vitamins-Minerals (ONE-A-DAY PROACTIVE 65+) TABS Take 1 tablet by mouth daily with breakfast.    [provider]  nitroGLYCERIN (NITROSTAT) 0.4 MG SL tablet Place 0.4 mg under the tongue every 5 (five) minutes as needed for  chest pain.     [provider]  omeprazole (PRILOSEC) 20 MG capsule TAKE 1 CAPSULE BY MOUTH 30 TO 60 MINUTES BEFORE THE FIRST AND LAST MEALS OF THE DAY Patient taking differently: Take 20 mg by mouth 2 (two) times daily before a meal.    Kalman Shan, MD  OXYGEN Inhale 2 L/min into the lungs at bedtime.    [provider]  polyethylene glycol (MIRALAX / GLYCOLAX) 17 g packet Take 17 g by mouth daily as needed for mild constipation. 08/09/21   [place], Eric J, DO  pravastatin (PRAVACHOL) 20 MG tablet Take 20 mg by mouth at bedtime. 04/17/19   [provider]  QUEtiapine (SEROQUEL) 100 MG tablet Take 100 mg by mouth 2 (two) times daily.    [provider]  senna (SENOKOT) 8.6 MG TABS tablet Take 1 tablet (8.6 mg total) by mouth 2 (two) times daily. 08/09/21   [place], Alvira Philips, DO  sertraline (ZOLOFT) 25 MG tablet Take 25 mg by mouth at bedtime. 04/20/19   [provider]  solifenacin (VESICARE) 5 MG tablet Take 5 mg by mouth at bedtime.     [provider]  traZODone (DESYREL) 50 MG tablet Take 50 mg by mouth at bedtime.    [provider]    Physical Exam: Vitals:   09/29/22 2100 09/29/22 2135 09/29/22 2151 09/29/22 2200  BP: (!) 133/59 (!) 141/76  138/85  Pulse: 84 99  (!) 101  Resp: (!) 24 19  (!) 25  Temp:      TempSrc:      SpO2: 98% 99%  98%  Weight:   54.4 kg   Height:   5' (1.524 m)     Physical Exam  Labs on Admission: I have personally reviewed following labs and imaging studies  CBC: Recent Labs  Lab 09/29/22 1943 09/29/22 1948  WBC 14.4*  --   NEUTROABS 11.1*  --   HGB 12.5 12.6  HCT 38.0 37.0  MCV 95.7  --   PLT 197  --    Basic Metabolic Panel: Recent Labs  Lab 09/29/22 1943 09/29/22 1948  NA 135 139  K 5.3* 4.5  CL 103  --   CO2 20*  --   GLUCOSE 97  --   BUN 30*  --   CREATININE 1.00  --   CALCIUM 7.6*  --    GFR: Estimated Creatinine Clearance: 24.2 mL/min (by C-G formula based on  SCr of 1 mg/dL). Liver Function Tests: Recent Labs  Lab 09/29/22 1943  AST 26  ALT 10  ALKPHOS 66  BILITOT 1.1  PROT 5.7*  ALBUMIN 2.8*   No results for input(s): "LIPASE", "AMYLASE" in the last 168 hours. No results for input(s): "AMMONIA" in the last 168 hours. Coagulation Profile: No results for input(s): "INR", "PROTIME" in the last 168 hours. Cardiac Enzymes: No results for input(s): "CKTOTAL", "CKMB", "CKMBINDEX", "TROPONINI" in the last 168 hours. BNP (last 3 results) No results for input(s): "PROBNP" in the last 8760 hours. HbA1C: No results for input(s): "HGBA1C" in the last  72 hours. CBG: No results for input(s): "GLUCAP" in the last 168 hours. Lipid Profile: No results for input(s): "CHOL", "HDL", "LDLCALC", "TRIG", "CHOLHDL", "LDLDIRECT" in the last 72 hours. Thyroid Function Tests: No results for input(s): "TSH", "T4TOTAL", "FREET4", "T3FREE", "THYROIDAB" in the last 72 hours. Anemia Panel: No results for input(s): "VITAMINB12", "FOLATE", "FERRITIN", "TIBC", "IRON", "RETICCTPCT" in the last 72 hours. Urine analysis:    Component Value Date/Time   COLORURINE YELLOW 04/03/2022 0125   APPEARANCEUR HAZY (A) 04/03/2022 0125   LABSPEC >1.046 (H) 04/03/2022 0125   PHURINE 5.0 04/03/2022 0125   GLUCOSEU NEGATIVE 04/03/2022 0125   HGBUR NEGATIVE 04/03/2022 0125   BILIRUBINUR NEGATIVE 04/03/2022 0125   KETONESUR 20 (A) 04/03/2022 0125   PROTEINUR 30 (A) 04/03/2022 0125   UROBILINOGEN 0.2 06/17/2012 1645   NITRITE POSITIVE (A) 04/03/2022 0125   LEUKOCYTESUR TRACE (A) 04/03/2022 0125    Radiological Exams on Admission: DG Chest Portable 1 View  Result Date: 09/29/2022 CLINICAL DATA:  Shortness of breath, history of COPD. Cough and fever. Clinical concern for pneumonia. EXAM: PORTABLE CHEST 1 VIEW COMPARISON:  11/26/2019, 06/21/2009. FINDINGS: The heart size and mediastinal contours are within normal limits. There is atherosclerotic calcification of the aorta.  Extensive nodular opacities are noted in the lungs bilaterally, increased in size and number from the prior exam. No effusion or pneumothorax. No acute osseous abnormality. IMPRESSION: Extensive nodular opacities in the lungs bilaterally, increased in size and number from the prior exams going back to 2011. The possibility of superimposed infiltrate is not excluded. Electronically Signed   By: Thornell Sartorius M.D.   On: 09/29/2022 20:15    EKG: Independently reviewed.  Sinus rhythm, RBBB, QTc 498.  No acute ischemic changes.  Assessment and Plan  Community-acquired pneumonia SIRS/possible sepsis Patient is here for evaluation of cough and fever x 1 day.  Meets SIRS SIRS criteria with slight tachycardia and tachypnea, WBC count 14.4.  She is afebrile.  Lactate normal x 2. Chest x-ray showing extensive nodular opacities in the lungs bilaterally, increase in size and number from prior exams dating back to 2011, superimposed infiltrate not excluded.  COVID/influenza/RSV PCR negative.  Continue antibiotic coverage with ceftriaxone and doxycycline.  Avoid macrolides given QT prolongation.  Blood cultures pending.  Continue to monitor WBC count.  Acute COPD exacerbation Blood gas without evidence of hypercapnia.  Patient was given DuoNeb in the ED.  Chronic hypoxemic respiratory failure on 2 L [place]  Mild AKI Mild hyperkalemia BUN 30, creatinine 1.0 (baseline 0.5-0.7).  Potassium 5.3, bicarb 20.  Mild AKI likely secondary to dehydration given acute illness.  Give gentle IV fluid hydration and monitor BMP.  Avoid nephrotoxic agents.  QT prolongation Monitor potassium and magnesium levels.  Avoid QT prolonging drugs and repeat EKG in the morning.  Advanced Alzheimer's dementia  Hypertension  Hyperlipidemia  Hypothyroidism  PVD  Anxiety/depression  GERD  DVT prophylaxis: {Blank single:19197::"Lovenox","SQ Heparin","IV heparin gtt","Xarelto","Eliquis","Coumadin","SCDs","***"} Code Status: Full  code by default.  Patient does not have capacity for decision-making, no surrogate or prior directive available.  Please readdress in the morning when family is available.  Family Communication: ***  Consults called: ***  Level of care: {Blank single:19197::"Med-Surg","Telemetry bed","Progressive Care Unit","Step Down Unit"} Admission status: *** Time Spent: 75+ minutes***  John Giovanni MD [facility]  If 7PM-7AM, please contact night-coverage [email or web address]  09/29/2022, 11:36 PM

## 2022-09-29 NOTE — ED Notes (Signed)
M[place]s placed onto patient as she continues to remove O2 cannula and messing with IV sites.

## 2022-09-30 DIAGNOSIS — J9611 Chronic respiratory failure with hypoxia: Secondary | ICD-10-CM | POA: Diagnosis not present

## 2022-09-30 DIAGNOSIS — E039 Hypothyroidism, unspecified: Secondary | ICD-10-CM | POA: Diagnosis present

## 2022-09-30 DIAGNOSIS — E1151 Type 2 diabetes mellitus with diabetic peripheral angiopathy without gangrene: Secondary | ICD-10-CM | POA: Diagnosis present

## 2022-09-30 DIAGNOSIS — J159 Unspecified bacterial pneumonia: Secondary | ICD-10-CM | POA: Diagnosis present

## 2022-09-30 DIAGNOSIS — Z7189 Other specified counseling: Secondary | ICD-10-CM | POA: Diagnosis not present

## 2022-09-30 DIAGNOSIS — J441 Chronic obstructive pulmonary disease with (acute) exacerbation: Secondary | ICD-10-CM | POA: Diagnosis present

## 2022-09-30 DIAGNOSIS — E876 Hypokalemia: Secondary | ICD-10-CM | POA: Diagnosis present

## 2022-09-30 DIAGNOSIS — J189 Pneumonia, unspecified organism: Secondary | ICD-10-CM | POA: Diagnosis not present

## 2022-09-30 DIAGNOSIS — Z79890 Hormone replacement therapy (postmenopausal): Secondary | ICD-10-CM | POA: Diagnosis not present

## 2022-09-30 DIAGNOSIS — G9341 Metabolic encephalopathy: Secondary | ICD-10-CM

## 2022-09-30 DIAGNOSIS — I10 Essential (primary) hypertension: Secondary | ICD-10-CM | POA: Diagnosis present

## 2022-09-30 DIAGNOSIS — J440 Chronic obstructive pulmonary disease with acute lower respiratory infection: Secondary | ICD-10-CM | POA: Diagnosis present

## 2022-09-30 DIAGNOSIS — R9431 Abnormal electrocardiogram [ECG] [EKG]: Secondary | ICD-10-CM | POA: Diagnosis present

## 2022-09-30 DIAGNOSIS — F0280 Dementia in other diseases classified elsewhere without behavioral disturbance: Secondary | ICD-10-CM | POA: Diagnosis present

## 2022-09-30 DIAGNOSIS — N179 Acute kidney failure, unspecified: Secondary | ICD-10-CM | POA: Diagnosis not present

## 2022-09-30 DIAGNOSIS — Z515 Encounter for palliative care: Secondary | ICD-10-CM | POA: Diagnosis not present

## 2022-09-30 DIAGNOSIS — E875 Hyperkalemia: Secondary | ICD-10-CM

## 2022-09-30 DIAGNOSIS — R6510 Systemic inflammatory response syndrome (SIRS) of non-infectious origin without acute organ dysfunction: Secondary | ICD-10-CM

## 2022-09-30 DIAGNOSIS — Z1152 Encounter for screening for COVID-19: Secondary | ICD-10-CM | POA: Diagnosis not present

## 2022-09-30 DIAGNOSIS — J439 Emphysema, unspecified: Secondary | ICD-10-CM | POA: Diagnosis present

## 2022-09-30 DIAGNOSIS — Z96642 Presence of left artificial hip joint: Secondary | ICD-10-CM | POA: Diagnosis present

## 2022-09-30 DIAGNOSIS — G309 Alzheimer's disease, unspecified: Secondary | ICD-10-CM | POA: Diagnosis present

## 2022-09-30 DIAGNOSIS — E860 Dehydration: Secondary | ICD-10-CM | POA: Diagnosis present

## 2022-09-30 DIAGNOSIS — E8809 Other disorders of plasma-protein metabolism, not elsewhere classified: Secondary | ICD-10-CM | POA: Diagnosis present

## 2022-09-30 DIAGNOSIS — E872 Acidosis, unspecified: Secondary | ICD-10-CM | POA: Diagnosis present

## 2022-09-30 DIAGNOSIS — E8770 Fluid overload, unspecified: Secondary | ICD-10-CM | POA: Diagnosis present

## 2022-09-30 DIAGNOSIS — E785 Hyperlipidemia, unspecified: Secondary | ICD-10-CM | POA: Diagnosis present

## 2022-09-30 LAB — CBC
HCT: 34.4 % — ABNORMAL LOW (ref 36.0–46.0)
Hemoglobin: 11.3 g/dL — ABNORMAL LOW (ref 12.0–15.0)
MCH: 30.5 pg (ref 26.0–34.0)
MCHC: 32.8 g/dL (ref 30.0–36.0)
MCV: 93 fL (ref 80.0–100.0)
Platelets: 196 10*3/uL (ref 150–400)
RBC: 3.7 MIL/uL — ABNORMAL LOW (ref 3.87–5.11)
RDW: 14.4 % (ref 11.5–15.5)
WBC: 13.8 10*3/uL — ABNORMAL HIGH (ref 4.0–10.5)
nRBC: 0 % (ref 0.0–0.2)

## 2022-09-30 LAB — BASIC METABOLIC PANEL
Anion gap: 13 (ref 5–15)
BUN: 23 mg/dL (ref 8–23)
CO2: 23 mmol/L (ref 22–32)
Calcium: 7.7 mg/dL — ABNORMAL LOW (ref 8.9–10.3)
Chloride: 102 mmol/L (ref 98–111)
Creatinine, Ser: 0.88 mg/dL (ref 0.44–1.00)
GFR, Estimated: >60 mL/min (ref >60)
Glucose, Bld: 104 mg/dL — ABNORMAL HIGH (ref 70–99)
Potassium: 3.9 mmol/L (ref 3.5–5.1)
Sodium: 138 mmol/L (ref 135–145)

## 2022-09-30 LAB — TSH: TSH: 2.472 u[IU]/mL (ref 0.350–4.500)

## 2022-09-30 LAB — PROCALCITONIN: Procalcitonin: 1.25 ng/mL

## 2022-09-30 LAB — VITAMIN B12: Vitamin B-12: 465 pg/mL (ref 180–914)

## 2022-09-30 LAB — MAGNESIUM: Magnesium: 1.9 mg/dL (ref 1.7–2.4)

## 2022-09-30 LAB — AMMONIA: Ammonia: 28 umol/L (ref 9–35)

## 2022-09-30 MED ORDER — ACETAMINOPHEN 650 MG RE SUPP: 650.0000 mg | Freq: Four times a day (QID) | RECTAL | Status: DC | PRN

## 2022-09-30 MED ORDER — ALBUTEROL SULFATE (2.5 MG/3ML) 0.083% IN NEBU
2.5000 mg | INHALATION_SOLUTION | RESPIRATORY_TRACT | Status: DC | PRN
  Administered 2022-10-02: 2.5 mg via RESPIRATORY_TRACT
  Filled 2022-09-30 (×2): qty 3

## 2022-09-30 MED ORDER — SODIUM CHLORIDE 0.9 % IV SOLN: 1.0000 g | Freq: Once | INTRAVENOUS | Status: DC

## 2022-09-30 MED ORDER — SODIUM CHLORIDE 0.9 % IV SOLN: INTRAVENOUS | Status: AC

## 2022-09-30 MED ORDER — SODIUM CHLORIDE 0.9 % IV SOLN
1.0000 g | INTRAVENOUS | Status: AC
  Administered 2022-09-30 – 2022-10-03 (×4): 1 g via INTRAVENOUS
  Filled 2022-09-30 (×4): qty 10

## 2022-09-30 MED ORDER — IPRATROPIUM-ALBUTEROL 0.5-2.5 (3) MG/3ML IN SOLN
3.0000 mL | Freq: Four times a day (QID) | RESPIRATORY_TRACT | Status: DC
  Administered 2022-09-30 – 2022-10-02 (×10): 3 mL via RESPIRATORY_TRACT
  Filled 2022-09-30 (×10): qty 3

## 2022-09-30 MED ORDER — SODIUM CHLORIDE 0.9 % IV SOLN
100.0000 mg | Freq: Two times a day (BID) | INTRAVENOUS | Status: DC
  Administered 2022-09-30 – 2022-10-01 (×3): 100 mg via INTRAVENOUS
  Filled 2022-09-30 (×4): qty 100

## 2022-09-30 MED ORDER — LORAZEPAM 2 MG/ML IJ SOLN
0.5000 mg | Freq: Four times a day (QID) | INTRAMUSCULAR | Status: AC | PRN
  Administered 2022-10-01 – 2022-10-02 (×2): 0.5 mg via INTRAVENOUS
  Filled 2022-09-30 (×2): qty 1

## 2022-09-30 MED ORDER — IPRATROPIUM-ALBUTEROL 0.5-2.5 (3) MG/3ML IN SOLN
3.0000 mL | Freq: Three times a day (TID) | RESPIRATORY_TRACT | Status: DC
  Administered 2022-09-30: 3 mL via RESPIRATORY_TRACT
  Filled 2022-09-30: qty 3

## 2022-09-30 MED ORDER — DM-GUAIFENESIN ER 30-600 MG PO TB12
1.0000 | ORAL_TABLET | Freq: Two times a day (BID) | ORAL | Status: DC
  Administered 2022-09-30 – 2022-10-01 (×4): 1 via ORAL
  Filled 2022-09-30 (×4): qty 1

## 2022-09-30 MED ORDER — ALBUTEROL SULFATE (2.5 MG/3ML) 0.083% IN NEBU
2.5000 mg | INHALATION_SOLUTION | Freq: Four times a day (QID) | RESPIRATORY_TRACT | Status: DC | PRN
Start: 2022-09-30 — End: 2022-09-30

## 2022-09-30 MED ORDER — METHYLPREDNISOLONE SODIUM SUCC 125 MG IJ SOLR
80.0000 mg | INTRAMUSCULAR | Status: DC
  Administered 2022-09-30 – 2022-10-02 (×3): 80 mg via INTRAVENOUS
  Filled 2022-09-30 (×3): qty 2

## 2022-09-30 MED ORDER — GUAIFENESIN-DM 100-10 MG/5ML PO SYRP
5.0000 mL | ORAL_SOLUTION | ORAL | Status: DC | PRN
  Administered 2022-09-30 – 2022-10-01 (×3): 5 mL via ORAL
  Filled 2022-09-30 (×3): qty 5

## 2022-09-30 MED ORDER — ACETAMINOPHEN 325 MG PO TABS
650.0000 mg | ORAL_TABLET | Freq: Four times a day (QID) | ORAL | Status: DC | PRN
  Administered 2022-09-30 – 2022-10-02 (×2): 650 mg via ORAL
  Filled 2022-09-30 (×2): qty 2

## 2022-09-30 MED ORDER — ENOXAPARIN SODIUM 30 MG/0.3ML IJ SOSY: 30.0000 mg | PREFILLED_SYRINGE | INTRAMUSCULAR | Status: DC

## 2022-09-30 NOTE — Progress Notes (Signed)
[facility]  PROGRESS NOTE  Mackenzie Peterson IRJ:[id number] DOB: 04/27/1924 DOA: 09/29/2022 PCP: [name], MD   Brief HPI:    89 y.o. Spanish-speaking female with medical history significant of advanced Alzheimer's dementia, COPD, chronic hypoxemic respiratory failure on 2 L [place], hypertension, hyperlipidemia, hypothyroidism, PVD, anxiety, depression, GERD presents to the ED for evaluation of cough and fever x 1 day.  In the ED, oxygen saturation 96% on 2 L on arrival.  Tachypneic to the 20s and borderline tachycardic.  Chest x-ray showing extensive nodular opacities in the lungs bilaterally, increase in size and number from prior exams dating back to 2011, superimposed infiltrate not excluded. Patient was given DuoNeb, ceftriaxone, and azithromycin.    Assessment/Plan:   Community-acquired pneumonia -Chest x-ray showed extensive nodular opacity in the lungs bilaterally, increased from prior exams dating back to 2011 -Started on ceftriaxone and doxycycline -Avoid macrolide due to QT prolongation -Follow blood culture results  COPD exacerbation -Blood gas did not show hypercapnia -Continue Solu-Medrol 40 mg IV every 12 hours, scheduled DuoNebs every 8 hours -Mucinex DM,  Chronic hypoxemic respiratory failure -She is chronically on oxygen 2 L/min  Mild AKI -Resolved, creatinine 0.88  QT prolongation -Potassium 3.9, magnesium 1.9 -Continue monitoring on telemetry  Metabolic encephalopathy -Advanced SMS dementia -Stable -Continue Ativan as needed for agitation  Hypertension -Pressure is stable  Hypothyroidism -Follow TSH    Medications     dextromethorphan-guaiFENesin  1 tablet Oral BID   ipratropium-albuterol  3 mL Nebulization Q8H   methylPREDNISolone (SOLU-MEDROL) injection  80 mg Intravenous Q24H     Data Reviewed:   CBG:  No results for input(s): "GLUCAP" in the last 168 hours.  SpO2: 97 % O2 Flow Rate (L/min): 2 L/min    Vitals:   09/30/22 0105  09/30/22 0300 09/30/22 0427 09/30/22 0755  BP: 134/63  (!) 114/50 (!) 130/59  Pulse: 98 83 78 76  Resp: 20 20 18 20   Temp: 97.9 F (36.6 C)  99.3 F (37.4 C) 98.8 F (37.1 C)  TempSrc: Oral  Oral Oral  SpO2: 97% 95%  97%  Weight: 50.1 kg     Height:          Data Reviewed:  Basic Metabolic Panel: Recent Labs  Lab 09/29/22 1943 09/29/22 1948 09/30/22 0319  NA 135 139 138  K 5.3* 4.5 3.9  CL 103  --  102  CO2 20*  --  23  GLUCOSE 97  --  104*  BUN 30*  --  23  CREATININE 1.00  --  0.88  CALCIUM 7.6*  --  7.7*  MG  --   --  1.9    CBC: Recent Labs  Lab 09/29/22 1943 09/29/22 1948 09/30/22 0319  WBC 14.4*  --  13.8*  NEUTROABS 11.1*  --   --   HGB 12.5 12.6 11.3*  HCT 38.0 37.0 34.4*  MCV 95.7  --  93.0  PLT 197  --  196    LFT Recent Labs  Lab 09/29/22 1943  AST 26  ALT 10  ALKPHOS 66  BILITOT 1.1  PROT 5.7*  ALBUMIN 2.8*     Antibiotics: Anti-infectives (From admission, onward)    Start     Dose/Rate Route Frequency Ordered Stop   09/30/22 2000  cefTRIAXone (ROCEPHIN) 1 g in sodium chloride 0.9 % 100 mL IVPB        1 g 200 mL/hr over 30 Minutes Intravenous  Once 09/30/22 0023     09/30/22 1000  doxycycline (VIBRAMYCIN) 100 mg in sodium chloride 0.9 % 250 mL IVPB        100 mg 125 mL/hr over 120 Minutes Intravenous Every 12 hours 09/30/22 0023     09/29/22 2030  cefTRIAXone (ROCEPHIN) 1 g in sodium chloride 0.9 % 100 mL IVPB        1 g 200 mL/hr over 30 Minutes Intravenous  Once 09/29/22 2021 09/29/22 2050   09/29/22 2030  azithromycin (ZITHROMAX) 500 mg in sodium chloride 0.9 % 250 mL IVPB        500 mg 250 mL/hr over 60 Minutes Intravenous  Once 09/29/22 2021 09/29/22 2140        DVT prophylaxis: SCDs  Code Status: Full code  Family Communication: No family at bedside   CONSULTS    Subjective   Denies shortness of breath   Objective    Physical Examination:   General-appears in no acute distress Heart-S1-S2,  regular, no murmur auscultated Lungs-bilateral rhonchi auscultated Abdomen-soft, nontender, no organomegaly Extremities-no edema in the lower extremities Neuro-alert, oriented x3, no focal deficit noted  Status is: Inpatient:             [name]   [facility] If 7PM-7AM, please contact night-coverage at [email or web address], Office  [phone]   09/30/2022, 11:25 AM  LOS: 0 days

## 2022-09-30 NOTE — Plan of Care (Signed)
  Problem: Clinical Measurements: Goal: Respiratory complications will improve Note: Continuous cough gave PRN Duo Nebs

## 2022-09-30 NOTE — Progress Notes (Signed)
Patient confused. RN unable to complete admission history. No family at bedside.

## 2022-09-30 NOTE — Significant Event (Signed)
Pt pulled Mitten Off and IV out. Confused With IV fluids and IV abx to be infused.

## 2022-09-30 NOTE — Progress Notes (Signed)
Pt experiencing continuous cough Added Humidifier to 02 [place]

## 2022-09-30 NOTE — Plan of Care (Signed)

## 2022-10-01 DIAGNOSIS — G9341 Metabolic encephalopathy: Secondary | ICD-10-CM | POA: Diagnosis not present

## 2022-10-01 DIAGNOSIS — N179 Acute kidney failure, unspecified: Secondary | ICD-10-CM | POA: Diagnosis not present

## 2022-10-01 DIAGNOSIS — F03B0 Unspecified dementia, moderate, without behavioral disturbance, psychotic disturbance, mood disturbance, and anxiety: Secondary | ICD-10-CM

## 2022-10-01 DIAGNOSIS — J9611 Chronic respiratory failure with hypoxia: Secondary | ICD-10-CM | POA: Diagnosis not present

## 2022-10-01 DIAGNOSIS — J189 Pneumonia, unspecified organism: Secondary | ICD-10-CM | POA: Diagnosis not present

## 2022-10-01 LAB — COMPREHENSIVE METABOLIC PANEL
ALT: 15 U/L (ref 0–44)
AST: 25 U/L (ref 15–41)
Albumin: 2.6 g/dL — ABNORMAL LOW (ref 3.5–5.0)
Alkaline Phosphatase: 54 U/L (ref 38–126)
Anion gap: 10 (ref 5–15)
BUN: 18 mg/dL (ref 8–23)
CO2: 21 mmol/L — ABNORMAL LOW (ref 22–32)
Calcium: 7.6 mg/dL — ABNORMAL LOW (ref 8.9–10.3)
Chloride: 108 mmol/L (ref 98–111)
Creatinine, Ser: 0.72 mg/dL (ref 0.44–1.00)
GFR, Estimated: >60 mL/min (ref >60)
Glucose, Bld: 118 mg/dL — ABNORMAL HIGH (ref 70–99)
Potassium: 3.6 mmol/L (ref 3.5–5.1)
Sodium: 139 mmol/L (ref 135–145)
Total Bilirubin: 0.5 mg/dL (ref 0.3–1.2)
Total Protein: 5.7 g/dL — ABNORMAL LOW (ref 6.5–8.1)

## 2022-10-01 LAB — CBC
HCT: 35.8 % — ABNORMAL LOW (ref 36.0–46.0)
Hemoglobin: 11.4 g/dL — ABNORMAL LOW (ref 12.0–15.0)
MCH: 30.2 pg (ref 26.0–34.0)
MCHC: 31.8 g/dL (ref 30.0–36.0)
MCV: 95 fL (ref 80.0–100.0)
Platelets: 201 10*3/uL (ref 150–400)
RBC: 3.77 MIL/uL — ABNORMAL LOW (ref 3.87–5.11)
RDW: 14.2 % (ref 11.5–15.5)
WBC: 12.7 10*3/uL — ABNORMAL HIGH (ref 4.0–10.5)
nRBC: 0 % (ref 0.0–0.2)

## 2022-10-01 MED ORDER — GUAIFENESIN ER 600 MG PO TB12
600.0000 mg | ORAL_TABLET | Freq: Two times a day (BID) | ORAL | Status: DC
  Administered 2022-10-01 – 2022-10-04 (×6): 600 mg via ORAL
  Filled 2022-10-01 (×6): qty 1

## 2022-10-01 MED ORDER — PHENOL 1.4 % MT LIQD
1.0000 | OROMUCOSAL | Status: DC | PRN
  Administered 2022-10-01: 1 via OROMUCOSAL
  Filled 2022-10-01: qty 177

## 2022-10-01 MED ORDER — DOXYCYCLINE HYCLATE 100 MG PO TABS
100.0000 mg | ORAL_TABLET | Freq: Two times a day (BID) | ORAL | Status: DC
  Administered 2022-10-01 – 2022-10-04 (×6): 100 mg via ORAL
  Filled 2022-10-01 (×6): qty 1

## 2022-10-01 MED ORDER — ACETYLCYSTEINE 20 % IN SOLN: 3.0000 mL | Freq: Two times a day (BID) | RESPIRATORY_TRACT | Status: DC

## 2022-10-01 MED ORDER — AMLODIPINE BESYLATE 5 MG PO TABS
5.0000 mg | ORAL_TABLET | Freq: Every day | ORAL | Status: DC
  Administered 2022-10-01 – 2022-10-04 (×4): 5 mg via ORAL
  Filled 2022-10-01 (×4): qty 1

## 2022-10-01 MED ORDER — MEMANTINE HCL 10 MG PO TABS
10.0000 mg | ORAL_TABLET | Freq: Two times a day (BID) | ORAL | Status: DC
  Administered 2022-10-01 – 2022-10-04 (×7): 10 mg via ORAL
  Filled 2022-10-01 (×7): qty 1

## 2022-10-01 MED ORDER — ACETYLCYSTEINE 20 % IN SOLN
3.0000 mL | Freq: Two times a day (BID) | RESPIRATORY_TRACT | Status: AC
  Administered 2022-10-01 – 2022-10-02 (×4): 3 mL via RESPIRATORY_TRACT
  Filled 2022-10-01 (×4): qty 4

## 2022-10-01 MED ORDER — LEVOTHYROXINE SODIUM 25 MCG PO TABS
25.0000 ug | ORAL_TABLET | Freq: Every day | ORAL | Status: DC
  Administered 2022-10-02 – 2022-10-04 (×3): 25 ug via ORAL
  Filled 2022-10-01 (×3): qty 1

## 2022-10-01 NOTE — Plan of Care (Signed)
  Problem: Clinical Measurements: Goal: Respiratory complications will improve Outcome: Progressing   

## 2022-10-01 NOTE — Progress Notes (Signed)
[facility]  PROGRESS NOTE  Mackenzie Peterson MWN:[id number] DOB: 07-Feb-1924 DOA: 09/29/2022 PCP: [name], MD   Brief HPI:    89 y.o. Spanish-speaking female with medical history significant of advanced Alzheimer's dementia, COPD, chronic hypoxemic respiratory failure on 2 L [place], hypertension, hyperlipidemia, hypothyroidism, PVD, anxiety, depression, GERD presents to the ED for evaluation of cough and fever x 1 day.  In the ED, oxygen saturation 96% on 2 L on arrival.  Tachypneic to the 20s and borderline tachycardic.  Chest x-ray showing extensive nodular opacities in the lungs bilaterally, increase in size and number from prior exams dating back to 2011, superimposed infiltrate not excluded. Patient was given DuoNeb, ceftriaxone, and azithromycin.    Assessment/Plan:   Community-acquired pneumonia -Chest x-ray showed extensive nodular opacity in the lungs bilaterally, increased from prior exams dating back to 2011 -Started on ceftriaxone and doxycycline -Avoid macrolide due to QT prolongation -Follow blood culture results  COPD exacerbation -Blood gas did not show hypercapnia -Continue Solu-Medrol 40 mg IV every 12 hours, scheduled DuoNebs every 8 hours -Mucinex DM -Will start Mucomyst nebulizer twice a day  Chronic hypoxemic respiratory failure -She is chronically on oxygen 2 L/min  Mild AKI -Resolved, creatinine 0.88  QT prolongation -Potassium 3.9, magnesium 1.9 -Continue monitoring on telemetry  Metabolic encephalopathy -Advanced  dementia -Stable -Continue Ativan as needed for agitation  Hypertension -Blood pressure is mildly elevated -Start amlodipine 5 mg daily  Hypothyroidism -TSH 2.472 -Restart Synthroid    Medications     dextromethorphan-guaiFENesin  1 tablet Oral BID   ipratropium-albuterol  3 mL Nebulization Q6H   methylPREDNISolone (SOLU-MEDROL) injection  80 mg Intravenous Q24H     Data Reviewed:   CBG:  No results for input(s):  "GLUCAP" in the last 168 hours.  SpO2: 97 % O2 Flow Rate (L/min): 2 L/min    Vitals:   10/01/22 0147 10/01/22 0536 10/01/22 0835 10/01/22 0839  BP:  (!) 166/80 (!) 172/79   Pulse:  95 94 92  Resp:  20 20 (!) 22  Temp:  97.7 F (36.5 C) 98.7 F (37.1 C)   TempSrc:  Oral Oral   SpO2: 96% 95%  97%  Weight:  51.5 kg    Height:          Data Reviewed:  Basic Metabolic Panel: Recent Labs  Lab 09/29/22 1943 09/29/22 1948 09/30/22 0319 10/01/22 0328  NA 135 139 138 139  K 5.3* 4.5 3.9 3.6  CL 103  --  102 108  CO2 20*  --  23 21*  GLUCOSE 97  --  104* 118*  BUN 30*  --  23 18  CREATININE 1.00  --  0.88 0.72  CALCIUM 7.6*  --  7.7* 7.6*  MG  --   --  1.9  --     CBC: Recent Labs  Lab 09/29/22 1943 09/29/22 1948 09/30/22 0319 10/01/22 0328  WBC 14.4*  --  13.8* 12.7*  NEUTROABS 11.1*  --   --   --   HGB 12.5 12.6 11.3* 11.4*  HCT 38.0 37.0 34.4* 35.8*  MCV 95.7  --  93.0 95.0  PLT 197  --  196 201    LFT Recent Labs  Lab 09/29/22 1943 10/01/22 0328  AST 26 25  ALT 10 15  ALKPHOS 66 54  BILITOT 1.1 0.5  PROT 5.7* 5.7*  ALBUMIN 2.8* 2.6*     Antibiotics: Anti-infectives (From admission, onward)    Start     [employer]  Frequency Ordered Stop   09/30/22 2000  cefTRIAXone (ROCEPHIN) 1 g in sodium chloride 0.9 % 100 mL IVPB  Status:  Discontinued        1 g 200 mL/hr over 30 Minutes Intravenous  Once 09/30/22 0023 09/30/22 1355   09/30/22 2000  cefTRIAXone (ROCEPHIN) 1 g in sodium chloride 0.9 % 100 mL IVPB        1 g 200 mL/hr over 30 Minutes Intravenous Every 24 hours 09/30/22 1355 10/04/22 1959   09/30/22 1000  doxycycline (VIBRAMYCIN) 100 mg in sodium chloride 0.9 % 250 mL IVPB        100 mg 125 mL/hr over 120 Minutes Intravenous Every 12 hours 09/30/22 0023     09/29/22 2030  cefTRIAXone (ROCEPHIN) 1 g in sodium chloride 0.9 % 100 mL IVPB        1 g 200 mL/hr over 30 Minutes Intravenous  Once 09/29/22 2021 09/29/22 2050   09/29/22 2030   azithromycin (ZITHROMAX) 500 mg in sodium chloride 0.9 % 250 mL IVPB        500 mg 250 mL/hr over 60 Minutes Intravenous  Once 09/29/22 2021 09/29/22 2140        DVT prophylaxis: SCDs  Code Status: Full code  Family Communication: No family at bedside   CONSULTS    Subjective   Breathing has improved today.  Difficulty coughing up phlegm.   Objective    Physical Examination:   General-appears in no acute distress Heart-S1-S2, regular, no murmur auscultated Lungs-scattered wheezing bilaterally Abdomen-soft, nontender, no organomegaly Extremities-no edema in the lower extremities Neuro-alert, oriented x3, no focal deficit noted  Status is: Inpatient:             Quay Simkin S Eboney Claybrook   [facility] If 7PM-7AM, please contact night-coverage at [email or web address], Office  [phone]   10/01/2022, 11:20 AM  LOS: 1 day

## 2022-10-01 NOTE — Plan of Care (Signed)

## 2022-10-02 DIAGNOSIS — J189 Pneumonia, unspecified organism: Secondary | ICD-10-CM | POA: Diagnosis not present

## 2022-10-02 DIAGNOSIS — G9341 Metabolic encephalopathy: Secondary | ICD-10-CM | POA: Diagnosis not present

## 2022-10-02 DIAGNOSIS — N179 Acute kidney failure, unspecified: Secondary | ICD-10-CM | POA: Diagnosis not present

## 2022-10-02 DIAGNOSIS — J9611 Chronic respiratory failure with hypoxia: Secondary | ICD-10-CM | POA: Diagnosis not present

## 2022-10-02 LAB — CBC
HCT: 37.3 % (ref 36.0–46.0)
Hemoglobin: 11.8 g/dL — ABNORMAL LOW (ref 12.0–15.0)
MCH: 29.8 pg (ref 26.0–34.0)
MCHC: 31.6 g/dL (ref 30.0–36.0)
MCV: 94.2 fL (ref 80.0–100.0)
Platelets: 271 10*3/uL (ref 150–400)
RBC: 3.96 MIL/uL (ref 3.87–5.11)
RDW: 14.3 % (ref 11.5–15.5)
WBC: 15.5 10*3/uL — ABNORMAL HIGH (ref 4.0–10.5)
nRBC: 0 % (ref 0.0–0.2)

## 2022-10-02 MED ORDER — FUROSEMIDE 10 MG/ML IJ SOLN
20.0000 mg | Freq: Once | INTRAMUSCULAR | Status: AC
  Administered 2022-10-02: 20 mg via INTRAVENOUS
  Filled 2022-10-02: qty 2

## 2022-10-02 MED ORDER — IPRATROPIUM-ALBUTEROL 0.5-2.5 (3) MG/3ML IN SOLN
3.0000 mL | Freq: Two times a day (BID) | RESPIRATORY_TRACT | Status: DC
  Administered 2022-10-03 – 2022-10-04 (×3): 3 mL via RESPIRATORY_TRACT
  Filled 2022-10-02 (×3): qty 3

## 2022-10-02 MED ORDER — PREDNISONE 20 MG PO TABS
40.0000 mg | ORAL_TABLET | Freq: Every day | ORAL | Status: DC
  Administered 2022-10-03 – 2022-10-04 (×2): 40 mg via ORAL
  Filled 2022-10-02 (×2): qty 2

## 2022-10-02 NOTE — Progress Notes (Addendum)
[facility]  PROGRESS NOTE  Mackenzie Peterson WUX:[id number] DOB: 30-Dec-1924 DOA: 09/29/2022 PCP: [name], MD   Brief HPI:    89 y.o. Spanish-speaking female with medical history significant of advanced Alzheimer's dementia, COPD, chronic hypoxemic respiratory failure on 2 L [place], hypertension, hyperlipidemia, hypothyroidism, PVD, anxiety, depression, GERD presents to the ED for evaluation of cough and fever x 1 day.  In the ED, oxygen saturation 96% on 2 L on arrival.  Tachypneic to the 20s and borderline tachycardic.  Chest x-ray showing extensive nodular opacities in the lungs bilaterally, increase in size and number from prior exams dating back to 2011, superimposed infiltrate not excluded. Patient was given DuoNeb, ceftriaxone, and azithromycin.    Assessment/Plan:   Community-acquired pneumonia -Chest x-ray showed extensive nodular opacity in the lungs bilaterally, increased from prior exams dating back to 2011 -Started on ceftriaxone and doxycycline -Avoid macrolide due to QT prolongation -Follow blood culture results  COPD exacerbation -Blood gas did not show hypercapnia -Started on Solu-Medrol 40 mg IV every 12 hours, will discontinue Solu-Medrol and start prednisone 40 mg p.o. daily -scheduled DuoNebs every 8 hours -Mucinex 600 mg p.o. twice daily -Continue Mucomyst nebulizer twice a day  Chronic hypoxemic respiratory failure -She is chronically on oxygen 2 L/min  Volume overload -Patient was getting IV normal saline, discontinued due to worsening shortness of breath this morning -Lasix 20 mg IV given with good diuretic response  Mild AKI -Resolved, creatinine 0.88  QT prolongation -Potassium 3.9, magnesium 1.9 -Continue monitoring on telemetry  Metabolic encephalopathy -Advanced  dementia -Stable -Continue Ativan as needed for agitation  Hypertension -Blood pressure is mildly elevated -Start amlodipine 5 mg daily  Hypothyroidism -TSH 2.472 -Restart  Synthroid    Medications     acetylcysteine  3 mL Nebulization BID   amLODipine  5 mg Oral Daily   doxycycline  100 mg Oral Q12H   guaiFENesin  600 mg Oral BID   ipratropium-albuterol  3 mL Nebulization Q6H   levothyroxine  25 mcg Oral QAC breakfast   memantine  10 mg Oral BID   methylPREDNISolone (SOLU-MEDROL) injection  80 mg Intravenous Q24H     Data Reviewed:   CBG:  No results for input(s): "GLUCAP" in the last 168 hours.  SpO2: 95 % O2 Flow Rate (L/min): 2 L/min    Vitals:   10/02/22 0117 10/02/22 0413 10/02/22 0735 10/02/22 0838  BP:  (!) 142/84 (!) 150/80   Pulse:  95 100 99  Resp:  18 18 19   Temp:  98.2 F (36.8 C) 98 F (36.7 C)   TempSrc:  Oral Oral   SpO2: 98% 96% 95%   Weight:  51 kg    Height:          Data Reviewed:  Basic Metabolic Panel: Recent Labs  Lab 09/29/22 1943 09/29/22 1948 09/30/22 0319 10/01/22 0328  NA 135 139 138 139  K 5.3* 4.5 3.9 3.6  CL 103  --  102 108  CO2 20*  --  23 21*  GLUCOSE 97  --  104* 118*  BUN 30*  --  23 18  CREATININE 1.00  --  0.88 0.72  CALCIUM 7.6*  --  7.7* 7.6*  MG  --   --  1.9  --     CBC: Recent Labs  Lab 09/29/22 1943 09/29/22 1948 09/30/22 0319 10/01/22 0328 10/02/22 0249  WBC 14.4*  --  13.8* 12.7* 15.5*  NEUTROABS 11.1*  --   --   --   --  HGB 12.5 12.6 11.3* 11.4* 11.8*  HCT 38.0 37.0 34.4* 35.8* 37.3  MCV 95.7  --  93.0 95.0 94.2  PLT 197  --  196 201 271    LFT Recent Labs  Lab 09/29/22 1943 10/01/22 0328  AST 26 25  ALT 10 15  ALKPHOS 66 54  BILITOT 1.1 0.5  PROT 5.7* 5.7*  ALBUMIN 2.8* 2.6*     Antibiotics: Anti-infectives (From admission, onward)    Start     Dose/Rate Route Frequency Ordered Stop   10/01/22 2200  doxycycline (VIBRA-TABS) tablet 100 mg        100 mg Oral Every 12 hours 10/01/22 1328 10/05/22 0959   09/30/22 2000  cefTRIAXone (ROCEPHIN) 1 g in sodium chloride 0.9 % 100 mL IVPB  Status:  Discontinued        1 g 200 mL/hr over 30 Minutes  Intravenous  Once 09/30/22 0023 09/30/22 1355   09/30/22 2000  cefTRIAXone (ROCEPHIN) 1 g in sodium chloride 0.9 % 100 mL IVPB        1 g 200 mL/hr over 30 Minutes Intravenous Every 24 hours 09/30/22 1355 10/04/22 1959   09/30/22 1000  doxycycline (VIBRAMYCIN) 100 mg in sodium chloride 0.9 % 250 mL IVPB  Status:  Discontinued        100 mg 125 mL/hr over 120 Minutes Intravenous Every 12 hours 09/30/22 0023 10/01/22 1328   09/29/22 2030  cefTRIAXone (ROCEPHIN) 1 g in sodium chloride 0.9 % 100 mL IVPB        1 g 200 mL/hr over 30 Minutes Intravenous  Once 09/29/22 2021 09/29/22 2050   09/29/22 2030  azithromycin (ZITHROMAX) 500 mg in sodium chloride 0.9 % 250 mL IVPB        500 mg 250 mL/hr over 60 Minutes Intravenous  Once 09/29/22 2021 09/29/22 2140        DVT prophylaxis: SCDs  Code Status: Full code  Family Communication: No family at bedside   CONSULTS    Subjective   Became hypoxic this morning, IV fluids were discontinued.  Given Lasix 20 mg IV x 1 and diuresed well.   Objective    Physical Examination:  General-appears in no acute distress Heart-S1-S2, regular, no murmur auscultated Lungs-scattered wheezing bilaterally Abdomen-soft, nontender, no organomegaly Extremities-no edema in the lower extremities   Status is: Inpatient:             [name]   [facility] If 7PM-7AM, please contact night-coverage at [email or web address], Office  [phone]   10/02/2022, 2:45 PM  LOS: 2 days

## 2022-10-02 NOTE — Plan of Care (Signed)
°  Problem: Coping: °Goal: Level of anxiety will decrease °Outcome: Progressing °  °

## 2022-10-03 DIAGNOSIS — G9341 Metabolic encephalopathy: Secondary | ICD-10-CM | POA: Diagnosis not present

## 2022-10-03 DIAGNOSIS — N179 Acute kidney failure, unspecified: Secondary | ICD-10-CM | POA: Diagnosis not present

## 2022-10-03 DIAGNOSIS — J9611 Chronic respiratory failure with hypoxia: Secondary | ICD-10-CM | POA: Diagnosis not present

## 2022-10-03 DIAGNOSIS — J189 Pneumonia, unspecified organism: Secondary | ICD-10-CM | POA: Diagnosis not present

## 2022-10-03 MED ORDER — LORAZEPAM 1 MG PO TABS
1.0000 mg | ORAL_TABLET | Freq: Four times a day (QID) | ORAL | Status: DC | PRN
  Administered 2022-10-03: 1 mg via ORAL
  Filled 2022-10-03: qty 1

## 2022-10-03 NOTE — Plan of Care (Signed)
  Problem: Clinical Measurements: Goal: Ability to maintain clinical measurements within normal limits will improve Outcome: Progressing   Problem: Clinical Measurements: Goal: Will remain free from infection Outcome: Progressing   

## 2022-10-03 NOTE — Progress Notes (Addendum)
[facility]  PROGRESS NOTE  Khusbu Ussery MVH:[id number] DOB: 1924/11/27 DOA: 09/29/2022 PCP: [name], MD   Brief HPI:    89 y.o. Spanish-speaking female with medical history significant of advanced Alzheimer's dementia, COPD, chronic hypoxemic respiratory failure on 2 L [place], hypertension, hyperlipidemia, hypothyroidism, PVD, anxiety, depression, GERD presents to the ED for evaluation of cough and fever x 1 day.  In the ED, oxygen saturation 96% on 2 L on arrival.  Tachypneic to the 20s and borderline tachycardic.  Chest x-ray showing extensive nodular opacities in the lungs bilaterally, increase in size and number from prior exams dating back to 2011, superimposed infiltrate not excluded. Patient was given DuoNeb, ceftriaxone, and azithromycin.    Assessment/Plan:   Community-acquired pneumonia -Chest x-ray showed extensive nodular opacity in the lungs bilaterally, increased from prior exams dating back to 2011 -Started on ceftriaxone and doxycycline -Avoid macrolide due to QT prolongation -Blood culture x 2 is negative to date -Patient uses oxygen 2 L/min at bedtime at home -Pulse ox on ambulation was 88% on room air  COPD exacerbation -Blood gas did not show hypercapnia -Started on Solu-Medrol 40 mg IV every 12 hours, will discontinue Solu-Medrol and start prednisone 40 mg p.o. daily -scheduled DuoNebs every 8 hours -Mucinex 600 mg p.o. twice daily -Continue Mucomyst nebulizer twice a day  Chronic hypoxemic respiratory failure -She is chronically on oxygen 2 L/min  Volume overload -Patient was getting IV normal saline, discontinued due to worsening shortness of breath  -Lasix 20 mg IV given with good diuretic response  Mild AKI -Resolved, creatinine 0.88  QT prolongation -Potassium 3.9, magnesium 1.9 -Continue monitoring on telemetry  Metabolic encephalopathy -Advanced  dementia -Stable -Continue Ativan as needed for agitation  Hypertension -Blood pressure  is mildly elevated -Started amlodipine 5 mg daily  Hypothyroidism -TSH 2.472 -Restarted Synthroid  Leukocytosis -WBC 15,000 -Likely from steroids  Disposition -PT recommends skilled nursing facility  Medications     amLODipine  5 mg Oral Daily   doxycycline  100 mg Oral Q12H   guaiFENesin  600 mg Oral BID   ipratropium-albuterol  3 mL Nebulization BID   levothyroxine  25 mcg Oral QAC breakfast   memantine  10 mg Oral BID   predniSONE  40 mg Oral Q breakfast     Data Reviewed:   CBG:  No results for input(s): "GLUCAP" in the last 168 hours.  SpO2: 90 % O2 Flow Rate (L/min): 2 L/min FiO2 (%): 28 %    Vitals:   10/03/22 0435 10/03/22 0832 10/03/22 0846 10/03/22 1148  BP: (!) 160/84  (!) 167/82   Pulse:  92 92   Resp:  18 18   Temp:   98.9 F (37.2 C)   TempSrc:   Oral   SpO2:   95% 90%  Weight:      Height:          Data Reviewed:  Basic Metabolic Panel: Recent Labs  Lab 09/29/22 1943 09/29/22 1948 09/30/22 0319 10/01/22 0328  NA 135 139 138 139  K 5.3* 4.5 3.9 3.6  CL 103  --  102 108  CO2 20*  --  23 21*  GLUCOSE 97  --  104* 118*  BUN 30*  --  23 18  CREATININE 1.00  --  0.88 0.72  CALCIUM 7.6*  --  7.7* 7.6*  MG  --   --  1.9  --     CBC: Recent Labs  Lab 09/29/22 1943 09/29/22 1948 09/30/22 0319  10/01/22 0328 10/02/22 0249  WBC 14.4*  --  13.8* 12.7* 15.5*  NEUTROABS 11.1*  --   --   --   --   HGB 12.5 12.6 11.3* 11.4* 11.8*  HCT 38.0 37.0 34.4* 35.8* 37.3  MCV 95.7  --  93.0 95.0 94.2  PLT 197  --  196 201 271    LFT Recent Labs  Lab 09/29/22 1943 10/01/22 0328  AST 26 25  ALT 10 15  ALKPHOS 66 54  BILITOT 1.1 0.5  PROT 5.7* 5.7*  ALBUMIN 2.8* 2.6*     Antibiotics: Anti-infectives (From admission, onward)    Start     Dose/Rate Route Frequency Ordered Stop   10/01/22 2200  doxycycline (VIBRA-TABS) tablet 100 mg        100 mg Oral Every 12 hours 10/01/22 1328 10/05/22 0959   09/30/22 2000  cefTRIAXone  (ROCEPHIN) 1 g in sodium chloride 0.9 % 100 mL IVPB  Status:  Discontinued        1 g 200 mL/hr over 30 Minutes Intravenous  Once 09/30/22 0023 09/30/22 1355   09/30/22 2000  cefTRIAXone (ROCEPHIN) 1 g in sodium chloride 0.9 % 100 mL IVPB        1 g 200 mL/hr over 30 Minutes Intravenous Every 24 hours 09/30/22 1355 10/04/22 1959   09/30/22 1000  doxycycline (VIBRAMYCIN) 100 mg in sodium chloride 0.9 % 250 mL IVPB  Status:  Discontinued        100 mg 125 mL/hr over 120 Minutes Intravenous Every 12 hours 09/30/22 0023 10/01/22 1328   09/29/22 2030  cefTRIAXone (ROCEPHIN) 1 g in sodium chloride 0.9 % 100 mL IVPB        1 g 200 mL/hr over 30 Minutes Intravenous  Once 09/29/22 2021 09/29/22 2050   09/29/22 2030  azithromycin (ZITHROMAX) 500 mg in sodium chloride 0.9 % 250 mL IVPB        500 mg 250 mL/hr over 60 Minutes Intravenous  Once 09/29/22 2021 09/29/22 2140        DVT prophylaxis: SCDs  Code Status: Full code  Family Communication: No family at bedside   CONSULTS    Subjective    Pleasantly confused, breathing has improved.  O2 sats 91% on room air, pulse ox on ambulation 88% on room air.  Communicated with tele interpreter  Objective    Physical Examination:  Appears in no acute distress Pleasantly confused S1-S2, regular Lungs clear to auscultation bilaterally  Status is: Inpatient:             [name]   [facility] If 7PM-7AM, please contact night-coverage at [email or web address], Office  [phone]   10/03/2022, 12:07 PM  LOS: 3 days

## 2022-10-03 NOTE — [place]2 (Signed)
[place] MEDICAID FL2 LEVEL OF CARE FORM     IDENTIFICATION  Patient Name: Mackenzie Peterson Birthdate: February 13, 1924 Sex: female Admission Date (Current Location): 09/29/2022  [facility] and [place] Number:  Producer, television/film/video and Address:  [facility], [address], [place], [place] [address]      Provider Number: [id number]  Attending Physician Name and Address:  [name], MD  Relative Name and Phone Number:  Natalia Leatherwood [phone]    Current Level of Care: Hospital Recommended Level of Care: Skilled Nursing Facility Prior Approval Number:    Date Approved/Denied:   PASRR Number: [id number]  Discharge Plan: SNF    Current Diagnoses: Patient Active Problem List   Diagnosis Date Noted   SIRS (systemic inflammatory response syndrome) (HCC) 09/30/2022   AKI (acute kidney injury) (HCC) 09/30/2022   Hyperkalemia 09/30/2022   QT prolongation 09/30/2022   CAP (community acquired pneumonia) 09/29/2022   Diarrhea 04/03/2022   UTI (urinary tract infection) 04/03/2022   Hypokalemia 04/03/2022   Acute metabolic encephalopathy 04/03/2022   Acute postoperative anemia due to expected blood loss 08/08/2021   Closed left hip fracture (HCC) 08/06/2021   Dehydration 08/06/2021   Fall at home, initial encounter 08/06/2021   COPD with acute exacerbation (HCC) 08/06/2021   HLD (hyperlipidemia) 08/06/2021   Anxiety 08/06/2021   Intertrochanteric fracture of right femur (HCC) 11/28/2019   Closed right hip fracture, initial encounter (HCC) 11/28/2019   Delirium 11/26/2019   Chronic respiratory failure with hypoxia (HCC) 01/16/2012   Dementia (HCC) 01/06/2012   Pneumonia 01/06/2012   Leukocytosis 01/05/2012   Abdominal pain 01/05/2012   Hypothyroidism 10/26/2009   Depression 10/26/2009   Mild persistent chronic asthma without complication 10/26/2009   SHORTNESS OF BREATH (SOB) 10/26/2009   COUGH 10/26/2009    Orientation RESPIRATION BLADDER Height &  Weight     Self  O2 (2 liters) Incontinent, External catheter Weight: 107 lb 5.8 oz (48.7 kg) Height:  5' (152.4 cm)  BEHAVIORAL SYMPTOMS/MOOD NEUROLOGICAL BOWEL NUTRITION STATUS      Incontinent Diet (See dc summary)  AMBULATORY STATUS COMMUNICATION OF NEEDS Skin   Extensive Assist Verbally (Pt speaks spanish) Normal                       Personal Care Assistance Level of Assistance  Bathing, Feeding, Dressing Bathing Assistance: Maximum assistance Feeding assistance: Limited assistance Dressing Assistance: Maximum assistance     Functional Limitations Info  Sight, Hearing, Speech Sight Info: Adequate Hearing Info: Adequate Speech Info: Adequate    SPECIAL CARE FACTORS FREQUENCY  PT (By licensed PT), OT (By licensed OT)     PT Frequency: 5xweek OT Frequency: 5xweek            Contractures Contractures Info: Not present    Additional Factors Info  Code Status, Allergies Code Status Info: Full Allergies Info: NKA           Current Medications (10/03/2022):  This is the current hospital active medication list Current Facility-Administered Medications  Medication Dose Route Frequency Provider Last Rate Last Admin   acetaminophen (TYLENOL) tablet 650 mg  650 mg Oral Q6H PRN John Giovanni, MD   650 mg at 10/02/22 1035   Or   acetaminophen (TYLENOL) suppository 650 mg  650 mg Rectal Q6H PRN John Giovanni, MD       albuterol (PROVENTIL) (2.5 MG/3ML) 0.083% nebulizer solution 2.5 mg  2.5 mg Nebulization Q4H PRN John Giovanni, MD  2.5 mg at 10/02/22 0826   amLODipine (NORVASC) tablet 5 mg  5 mg Oral Daily [name], MD   5 mg at 10/03/22 0811   cefTRIAXone (ROCEPHIN) 1 g in sodium chloride 0.9 % 100 mL IVPB  1 g Intravenous Q24H [name], MD 200 mL/hr at 10/02/22 2046 1 g at 10/02/22 2046   doxycycline (VIBRA-TABS) tablet 100 mg  100 mg Oral Q12H [name], MD   100 mg at 10/03/22 0811   guaiFENesin (MUCINEX) 12 hr tablet 600 mg  600 mg  Oral BID [name], MD   600 mg at 10/03/22 9604   ipratropium-albuterol (DUONEB) 0.5-2.5 (3) MG/3ML nebulizer solution 3 mL  3 mL Nebulization BID [name], MD   3 mL at 10/03/22 5409   levothyroxine (SYNTHROID) tablet 25 mcg  25 mcg Oral QAC breakfast [name], MD   25 mcg at 10/03/22 0559   memantine (NAMENDA) tablet 10 mg  10 mg Oral BID [name], MD   10 mg at 10/03/22 0811   phenol (CHLORASEPTIC) mouth spray 1 spray  1 spray Mouth/Throat PRN [name], MD   1 spray at 10/01/22 1123   predniSONE (DELTASONE) tablet 40 mg  40 mg Oral Q breakfast [name], MD   40 mg at 10/03/22 8119     Discharge Medications: Please see discharge summary for a list of discharge medications.  Relevant Imaging Results:  Relevant Lab Results:   Additional Information SSN: [id number] Spanish interpreter  Oletta Lamas, MSW, LCSWA, LCASA Transitions of Care  Clinical Social Worker I

## 2022-10-03 NOTE — TOC Initial Note (Signed)
Transition of Care [facility]) - Initial/Assessment Note    Patient Details  Name: Mackenzie Peterson MRN: [id number] Date of Birth: 1924/09/09  [facility]) CM/SW Contact:    Leander Rams, LCSW Phone Number: 10/03/2022, 12:38 PM  Clinical Narrative:                 CSW spoke with pt daughter in law [place]. Natalia Leatherwood reports pt lives with her and she has been taking care of her for the past several years. Natalia Leatherwood reports pt has  gone to SNF before at [facility] and she is agreeable for pt to be faxed out.   CSW completed fl2 and faxed out. TOC will continue to follow.  Expected Discharge Plan: Skilled Nursing Facility Barriers to Discharge: Continued Medical Work up   Patient Goals and CMS Choice            Expected Discharge Plan and Services       Living arrangements for the past 2 months: Single Family Home                                      Prior Living Arrangements/Services Living arrangements for the past 2 months: Single Family Home Lives with:: Adult Children Patient language and need for interpreter reviewed::  (Assessment completed with daughter) Do you feel safe going back to the place where you live?: Yes      Need for Family Participation in Patient Care: Yes (Comment) Care giver support system in place?: Yes (comment) Current home services: DME Criminal Activity/Legal Involvement Pertinent to Current Situation/Hospitalization: No - Comment as needed  Activities of Daily Living      Permission Sought/Granted                  Emotional Assessment   Attitude/Demeanor/Rapport: Unable to Assess Affect (typically observed): Unable to Assess Orientation: : Oriented to Self Alcohol / Substance Use: Not Applicable Psych Involvement: No (comment)  Admission diagnosis:  CAP (community acquired pneumonia) [J18.9] Patient Active Problem List   Diagnosis Date Noted   SIRS (systemic inflammatory response syndrome) (HCC) 09/30/2022    AKI (acute kidney injury) (HCC) 09/30/2022   Hyperkalemia 09/30/2022   QT prolongation 09/30/2022   CAP (community acquired pneumonia) 09/29/2022   Diarrhea 04/03/2022   UTI (urinary tract infection) 04/03/2022   Hypokalemia 04/03/2022   Acute metabolic encephalopathy 04/03/2022   Acute postoperative anemia due to expected blood loss 08/08/2021   Closed left hip fracture (HCC) 08/06/2021   Dehydration 08/06/2021   Fall at home, initial encounter 08/06/2021   COPD with acute exacerbation (HCC) 08/06/2021   HLD (hyperlipidemia) 08/06/2021   Anxiety 08/06/2021   Intertrochanteric fracture of right femur (HCC) 11/28/2019   Closed right hip fracture, initial encounter (HCC) 11/28/2019   Delirium 11/26/2019   Chronic respiratory failure with hypoxia (HCC) 01/16/2012   Dementia (HCC) 01/06/2012   Pneumonia 01/06/2012   Leukocytosis 01/05/2012   Abdominal pain 01/05/2012   Hypothyroidism 10/26/2009   Depression 10/26/2009   Mild persistent chronic asthma without complication 10/26/2009   SHORTNESS OF BREATH (SOB) 10/26/2009   COUGH 10/26/2009   PCP:  [name], MD Pharmacy:   [id number] - [place], [place] - [address] [place] [address] Phone: [phone] Fax: [phone]     Social Determinants of Health (SDOH)  Social History: SDOH Screenings   Tobacco Use: Low Risk  (09/29/2022)   SDOH Interventions:     Readmission Risk Interventions     No data to display         Oletta Lamas, MSW, LCSWA, LCASA Transitions of Care  Clinical Social Worker I

## 2022-10-03 NOTE — Evaluation (Addendum)
Physical Therapy Evaluation Patient Details Name: Mackenzie Peterson MRN: [id number] DOB: November 29, 1924 Today's Date: 10/03/2022  History of Present Illness  Mackenzie Peterson is a 89 y.o. Spanish-speaking female with medical history significant of advanced Alzheimer's dementia, COPD, chronic hypoxemic respiratory failure on 2 L Py[place], hypertension, hyperlipidemia, hypothyroidism, PVD, anxiety, depression, GERD presents to the ED for evaluation of cough and fever x 1 day. Diagnosed with Pnuemonia.   Clinical Impression  Patient received in bed, fidgeting with lines. Interpreter used via [employer] [phone]). Patient is very confused. Unable to answer orientation questions. Patient may be more confused than baseline as she told interpreter several times that she was confused and unable to answer question. She was able to follow 1 step instruction for mobility. Mod I with bed mobility, transfers with min/mod A. Ambulated around bed with Mod A and RW. She is limited by cognition and safety. Patient's O2 level at 88% on room air with mobility. She will continue to benefit from skilled PT to improve mobility and strength.            If plan is discharge home, recommend the following: A lot of help with walking and/or transfers;A lot of help with bathing/dressing/bathroom;Help with stairs or ramp for entrance;Direct supervision/assist for medications management;Assistance with feeding;Direct supervision/assist for financial management;Assist for transportation   Can travel by private vehicle   No    Equipment Recommendations Rolling walker (2 wheels)  Recommendations for Other Services       Functional Status Assessment Patient has had a recent decline in their functional status and demonstrates the ability to make significant improvements in function in a reasonable and predictable amount of time.     Precautions / Restrictions Precautions Precautions: Fall Restrictions Weight Bearing Restrictions: No       Mobility  Bed Mobility Overal bed mobility: Modified Independent for supine to sit. Assist needed to safely get back into bed due to confusion.              General bed mobility comments: max multi modal cues due to confusion    Transfers Overall transfer level: Needs assistance Equipment used: Rolling walker (2 wheels), None Transfers: Sit to/from Stand Sit to Stand: Mod assist           General transfer comment: posterior leaning in standing without AD. B UE support on bed, PT.    Ambulation/Gait Ambulation/Gait assistance: Min assist Gait Distance (Feet): 10 Feet Assistive device: Rolling walker (2 wheels) Gait Pattern/deviations: Step-to pattern, Decreased step length - right, Decreased step length - left, Decreased stride length, Trunk flexed Gait velocity: decr     General Gait Details: patient limited by confusion, ability to follow direction. She is able to ambulate around bed with RW but letting go of walker, holding to bed despite continued cues.  Stairs            Wheelchair Mobility     Tilt Bed    Modified Rankin (Stroke Patients Only)       Balance Overall balance assessment: Needs assistance Sitting-balance support: Feet supported Sitting balance-Leahy Scale: Fair     Standing balance support: Bilateral upper extremity supported, During functional activity, Reliant on assistive device for balance Standing balance-Leahy Scale: Poor Standing balance comment: requires B UE support and assistance for safety                             Pertinent Vitals/Pain Pain Assessment Pain Assessment:  No/denies pain    Home Living Family/patient expects to be discharged to:: Skilled nursing facility                   Additional Comments: patient is not able to provide information due to confusion    Prior Function Prior Level of Function : Patient poor historian/Family not available                      Extremity/Trunk Assessment   Upper Extremity Assessment Upper Extremity Assessment: Generalized weakness    Lower Extremity Assessment Lower Extremity Assessment: Generalized weakness    Cervical / Trunk Assessment Cervical / Trunk Assessment: Normal  Communication   Communication Communication: Other (comment) (spanish speaking) Cueing Techniques: Verbal cues;Gestural cues;Tactile cues;Visual cues  Cognition Arousal: Alert Behavior During Therapy: Restless Overall Cognitive Status: No family/caregiver present to determine baseline cognitive functioning                                          General Comments      Exercises     Assessment/Plan    PT Assessment Patient needs continued PT services  PT Problem List Decreased activity tolerance;Decreased balance;Decreased mobility;Decreased safety awareness;Decreased knowledge of use of DME;Cardiopulmonary status limiting activity;Decreased strength;Decreased cognition       PT Treatment Interventions DME instruction;Gait training;Functional mobility training;Therapeutic activities;Therapeutic exercise;Patient/family education;Balance training    PT Goals (Current goals can be found in the Care Plan section)  Acute Rehab PT Goals Patient Stated Goal: unable PT Goal Formulation: Patient unable to participate in goal setting Time For Goal Achievement: 10/17/22    Frequency Min 1X/week     Co-evaluation               AM-PAC PT "6 Clicks" Mobility  Outcome Measure Help needed turning from your back to your side while in a flat bed without using bedrails?: A Little Help needed moving from lying on your back to sitting on the side of a flat bed without using bedrails?: A Little Help needed moving to and from a bed to a chair (including a wheelchair)?: A Lot Help needed standing up from a chair using your arms (e.g., wheelchair or bedside chair)?: A Little Help needed to walk in hospital room?:  A Lot Help needed climbing 3-5 steps with a railing? : Total 6 Click Score: 14    End of Session Equipment Utilized During Treatment: Gait belt Activity Tolerance: Patient tolerated treatment well;Other (comment) (limited by confusion) Patient left: in bed;with bed alarm set;with call bell/phone within reach Nurse Communication: Mobility status PT Visit Diagnosis: Unsteadiness on feet (R26.81);Other abnormalities of gait and mobility (R26.89);Muscle weakness (generalized) (M62.81);Difficulty in walking, not elsewhere classified (R26.2)    Time: 1123-1150 PT Time Calculation (min) (ACUTE ONLY): 27 min   Charges:   PT Evaluation $PT Eval Moderate Complexity: 1 Mod PT Treatments $Gait Training: 8-22 mins PT General Charges $$ ACUTE PT VISIT: 1 Visit         Zayleigh Stroh, PT, GCS 10/03/22,11:57 AM

## 2022-10-04 ENCOUNTER — Encounter (HOSPITAL_COMMUNITY): Payer: Self-pay | Admitting: Internal Medicine

## 2022-10-04 DIAGNOSIS — Z7189 Other specified counseling: Secondary | ICD-10-CM | POA: Diagnosis not present

## 2022-10-04 DIAGNOSIS — Z515 Encounter for palliative care: Secondary | ICD-10-CM

## 2022-10-04 DIAGNOSIS — J189 Pneumonia, unspecified organism: Secondary | ICD-10-CM | POA: Diagnosis not present

## 2022-10-04 LAB — CBC
HCT: 37.7 % (ref 36.0–46.0)
Hemoglobin: 12.5 g/dL (ref 12.0–15.0)
MCH: 30.9 pg (ref 26.0–34.0)
MCHC: 33.2 g/dL (ref 30.0–36.0)
MCV: 93.1 fL (ref 80.0–100.0)
Platelets: 278 10*3/uL (ref 150–400)
RBC: 4.05 MIL/uL (ref 3.87–5.11)
RDW: 13.7 % (ref 11.5–15.5)
WBC: 13.2 10*3/uL — ABNORMAL HIGH (ref 4.0–10.5)
nRBC: 0 % (ref 0.0–0.2)

## 2022-10-04 LAB — CULTURE, BLOOD (ROUTINE X 2)
Culture: NO GROWTH
Culture: NO GROWTH
Special Requests: ADEQUATE
Special Requests: ADEQUATE

## 2022-10-04 LAB — BASIC METABOLIC PANEL
Anion gap: 11 (ref 5–15)
BUN: 23 mg/dL (ref 8–23)
CO2: 27 mmol/L (ref 22–32)
Calcium: 7.9 mg/dL — ABNORMAL LOW (ref 8.9–10.3)
Chloride: 104 mmol/L (ref 98–111)
Creatinine, Ser: 0.73 mg/dL (ref 0.44–1.00)
GFR, Estimated: >60 mL/min (ref >60)
Glucose, Bld: 96 mg/dL (ref 70–99)
Potassium: 3.3 mmol/L — ABNORMAL LOW (ref 3.5–5.1)
Sodium: 142 mmol/L (ref 135–145)

## 2022-10-04 MED ORDER — POTASSIUM CHLORIDE CRYS ER 20 MEQ PO TBCR
40.0000 meq | EXTENDED_RELEASE_TABLET | Freq: Once | ORAL | Status: AC
  Administered 2022-10-04: 40 meq via ORAL
  Filled 2022-10-04: qty 2

## 2022-10-04 MED ORDER — AMLODIPINE BESYLATE 5 MG PO TABS: 5.0000 mg | ORAL_TABLET | Freq: Every day | ORAL | 0 refills | Status: AC

## 2022-10-04 MED ORDER — GUAIFENESIN ER 600 MG PO TB12: 600.0000 mg | ORAL_TABLET | Freq: Two times a day (BID) | ORAL | 0 refills | Status: AC

## 2022-10-04 MED ORDER — PREDNISONE 20 MG PO TABS: 40.0000 mg | ORAL_TABLET | Freq: Every day | ORAL | 0 refills | Status: AC

## 2022-10-04 NOTE — Discharge Summary (Signed)
Physician Discharge Summary  Mackenzie Peterson UUV:[id number] DOB: 1924/09/20 DOA: 09/29/2022  PCP: [name], MD  Admit date: 09/29/2022 Discharge date: 10/04/2022  Admitted From: SNF Disposition: SNF  Recommendations for Outpatient Follow-up:  Follow up with SNF provider at earliest convenience Recommend outpatient evaluation and follow-up by palliative care Follow up in ED if symptoms worsen or new appear   Home Health: No Equipment/Devices: None.  Continue supplemental oxygen via nasal cannula at 2 L/min  Discharge Condition: Guarded to poor CODE STATUS: Full Diet recommendation: Heart healthy  Brief/Interim Summary: 89 y.o. Spanish-speaking female with medical history significant of advanced Alzheimer's dementia, COPD, chronic hypoxemic respiratory failure on 2 L , hypertension, hyperlipidemia, hypothyroidism, PVD, anxiety, depression, GERD presented with cough and fever.  On presentation, chest x-ray showed extensive nodular opacities in the lungs bilaterally, superimposed infiltrate not excluded.  She was started on IV antibiotics.  During the hospitalization, she was also started on IV steroids and subsequently switched to oral prednisone.  She is currently hemodynamically stable and has received 5 days of IV antibiotics.  Her respiratory status is stable.  She will be discharged back to SNF today.  Recommend outpatient evaluation and follow-up by palliative care.  Discharge Diagnoses:   Community-acquired possible bacterial pneumonia -Chest x-ray showed extensive nodular opacity in the lungs bilaterally, increased from prior exams dating back to 2011  -Treated with Rocephin and doxycycline.  Has completed 5 days course of antibiotics.  No need for further antibiotics.  COPD exacerbation Chronic respiratory failure with hypoxia -Currently remains on 2 L oxygen by nasal cannula which is her baseline.  Treated with IV Solu-Medrol and subsequently switched to oral  prednisone. -Continue prednisone 40 mg daily for 5 days.  Use as needed albuterol.  Outpatient follow-up with pulmonary if needed.  Volume overload -Treated with 1 dose of IV Lasix with good response.  No need for further diuretics on discharge.  Outpatient follow-up with PCP  Mild AKI -Resolved.  Outpatient follow-up.  QT prolongation -Outpatient follow-up  Advanced dementia Acute metabolic encephalopathy Goals of care -Patient has had intermittent agitation requiring Ativan.  Continue the same.  Continue other meds including donepezil, memantine, quetiapine and sertraline. -Listed as full code.  Palliative care consult requested today.  This can happen as an outpatient.  Hypertension -Continue amlodipine which was started during this hospitalization.  Hypothyroidism -Continue levothyroxine  GERD -Continue PPI  Hypokalemia -Replace prior to discharge  Leukocytosis -Improving   Discharge Instructions  Discharge Instructions     Amb Referral to Palliative Care   Complete by: As directed    Diet - low sodium heart healthy   Complete by: As directed    Increase activity slowly   Complete by: As directed       Allergies as of 10/04/2022   No Known Allergies      Medication List     TAKE these medications    acetaminophen 500 MG tablet Commonly known as: TYLENOL Take 500-1,000 mg by mouth every 6 (six) hours as needed for mild pain or headache.   albuterol (2.5 MG/3ML) 0.083% nebulizer solution Commonly known as: PROVENTIL Take 2.5 mg by nebulization 3 (three) times daily as needed for wheezing or shortness of breath.   ALPRAZolam 0.5 MG tablet Commonly known as: XANAX Take 1 tablet (0.5 mg total) by mouth 3 (three) times daily as needed for anxiety. What changed: when to take this   amLODipine 5 MG tablet Commonly known as: NORVASC Take 1 tablet (5 mg total)  by mouth daily. Start taking on: October 05, 2022   aspirin EC 81 MG tablet Take 81 mg by  mouth daily. Swallow whole.   cetirizine 10 MG tablet Commonly known as: ZYRTEC Take 10 mg by mouth at bedtime.   docusate sodium 100 MG capsule Commonly known as: COLACE Take 1 capsule (100 mg total) by mouth 2 (two) times daily.   donepezil 10 MG tablet Commonly known as: ARICEPT Take 10 mg by mouth at bedtime.   guaiFENesin 600 MG 12 hr tablet Commonly known as: MUCINEX Take 1 tablet (600 mg total) by mouth 2 (two) times daily.   levothyroxine 25 MCG tablet Commonly known as: SYNTHROID Take 25 mcg by mouth daily before breakfast.   memantine 10 MG tablet Commonly known as: NAMENDA Take 10 mg by mouth 2 (two) times daily.   nitroGLYCERIN 0.4 MG SL tablet Commonly known as: NITROSTAT Place 0.4 mg under the tongue every 5 (five) minutes as needed for chest pain.   omeprazole 20 MG capsule Commonly known as: PRILOSEC TAKE 1 CAPSULE BY MOUTH 30 TO 60 MINUTES BEFORE THE FIRST AND LAST MEALS OF THE DAY What changed: See the new instructions.   One-A-Day Proactive 65+ Tabs Take 1 tablet by mouth daily with breakfast.   OXYGEN Inhale 2 L/min into the lungs at bedtime.   pravastatin 20 MG tablet Commonly known as: PRAVACHOL Take 20 mg by mouth at bedtime.   predniSONE 20 MG tablet Commonly known as: DELTASONE Take 2 tablets (40 mg total) by mouth daily with breakfast for 5 days. Start taking on: October 05, 2022   QUEtiapine 100 MG tablet Commonly known as: SEROQUEL Take 100 mg by mouth at bedtime.   sertraline 25 MG tablet Commonly known as: ZOLOFT Take 25 mg by mouth at bedtime.   solifenacin 5 MG tablet Commonly known as: VESICARE Take 5 mg by mouth at bedtime.   traZODone 50 MG tablet Commonly known as: DESYREL Take 50 mg by mouth at bedtime.        Follow-up Information     [name], MD Follow up.   Specialty: Internal Medicine Why: Call and make appointment when discharged. Contact information: [address] [place]  [address] [phone]                No Known Allergies  Consultations: Palliative care consultation pending   Procedures/Studies: DG Chest Portable 1 View  Result Date: 09/29/2022 CLINICAL DATA:  Shortness of breath, history of COPD. Cough and fever. Clinical concern for pneumonia. EXAM: PORTABLE CHEST 1 VIEW COMPARISON:  11/26/2019, 06/21/2009. FINDINGS: The heart size and mediastinal contours are within normal limits. There is atherosclerotic calcification of the aorta. Extensive nodular opacities are noted in the lungs bilaterally, increased in size and number from the prior exam. No effusion or pneumothorax. No acute osseous abnormality. IMPRESSION: Extensive nodular opacities in the lungs bilaterally, increased in size and number from the prior exams going back to 2011. The possibility of superimposed infiltrate is not excluded. Electronically Signed   By: Thornell Sartorius M.D.   On: 09/29/2022 20:15      Subjective: Patient seen and examined at bedside.  Sleepy, wakes up slightly, poor historian.  No fever, vomiting, seizures reported.  Discharge Exam: Vitals:   10/04/22 0434 10/04/22 0856  BP: (!) 146/70   Pulse: 82 88  Resp: 18 20  Temp: 99 F (37.2 C)   SpO2: 98%     General: Elderly female lying in bed.  On 2 L oxygen via nasal cannula.  Sleepy, wakes up slightly, does not participate in conversation much.  Looks chronically ill and deconditioned. Cardiovascular: rate controlled, S1/S2 + Respiratory: bilateral decreased breath sounds at bases with scattered crackles Abdominal: Soft, NT, ND, bowel sounds + Extremities: Trace lower extremity edema; no cyanosis    The results of significant diagnostics from this hospitalization (including imaging, microbiology, ancillary and laboratory) are listed below for reference.     Microbiology: Recent Results (from the past 240 hour(s))  Resp panel by RT-PCR (RSV, Flu A&B, Covid) Anterior Nasal Swab     Status: None    Collection Time: 09/29/22  7:43 PM   Specimen: Anterior Nasal Swab  Result Value Ref Range Status   SARS Coronavirus 2 by RT PCR NEGATIVE NEGATIVE Final   Influenza A by PCR NEGATIVE NEGATIVE Final   Influenza B by PCR NEGATIVE NEGATIVE Final    Comment: (NOTE) The Xpert Xpress SARS-CoV-2/FLU/RSV plus assay is intended as an aid in the diagnosis of influenza from Nasopharyngeal swab specimens and should not be used as a sole basis for treatment. Nasal washings and aspirates are unacceptable for Xpert Xpress SARS-CoV-2/FLU/RSV testing.  Fact Sheet for Patients: BloggerCourse.com  Fact Sheet for Healthcare Providers: SeriousBroker.it  This test is not yet approved or cleared by the [place] FDA and has been authorized for detection and/or diagnosis of SARS-CoV-2 by FDA under an Emergency Use Authorization (EUA). This EUA will remain in effect (meaning this test can be used) for the duration of the COVID-19 declaration under Section 564(b)(1) of the Act, 21 U.S.C. section 360bbb-3(b)(1), unless the authorization is terminated or revoked.     Resp Syncytial Virus by PCR NEGATIVE NEGATIVE Final    Comment: (NOTE) Fact Sheet for Patients: BloggerCourse.com  Fact Sheet for Healthcare Providers: SeriousBroker.it  This test is not yet approved or cleared by the [place] FDA and has been authorized for detection and/or diagnosis of SARS-CoV-2 by FDA under an Emergency Use Authorization (EUA). This EUA will remain in effect (meaning this test can be used) for the duration of the COVID-19 declaration under Section 564(b)(1) of the Act, 21 U.S.C. section 360bbb-3(b)(1), unless the authorization is terminated or revoked.  Performed at [facility], [address], [place], [place] [address]   Blood culture (routine x 2)     Status: None   Collection Time: 09/29/22  8:04  PM   Specimen: BLOOD RIGHT ARM  Result Value Ref Range Status   Specimen Description BLOOD RIGHT ARM  Final   Special Requests   Final    BOTTLES DRAWN AEROBIC AND ANAEROBIC Blood Culture adequate volume   Culture   Final    NO GROWTH 5 DAYS Performed at [facility], [address], [place], [place] [address]    Report Status 10/04/2022 FINAL  Final  Blood culture (routine x 2)     Status: None   Collection Time: 09/29/22  8:04 PM   Specimen: BLOOD RIGHT HAND  Result Value Ref Range Status   Specimen Description BLOOD RIGHT HAND  Final   Special Requests   Final    BOTTLES DRAWN AEROBIC AND ANAEROBIC Blood Culture adequate volume   Culture   Final    NO GROWTH 5 DAYS Performed at [facility], [address] [place], [place] [address]    Report Status 10/04/2022 FINAL  Final     Labs: BNP (last 3 results) No results for input(s): "BNP"  in the last 8760 hours. Basic Metabolic Panel: Recent Labs  Lab 09/29/22 1943 09/29/22 1948 09/30/22 0319 10/01/22 0328 10/04/22 0304  NA 135 139 138 139 142  K 5.3* 4.5 3.9 3.6 3.3*  CL 103  --  102 108 104  CO2 20*  --  23 21* 27  GLUCOSE 97  --  104* 118* 96  BUN 30*  --  23 18 23   CREATININE 1.00  --  0.88 0.72 0.73  CALCIUM 7.6*  --  7.7* 7.6* 7.9*  MG  --   --  1.9  --   --    Liver Function Tests: Recent Labs  Lab 09/29/22 1943 10/01/22 0328  AST 26 25  ALT 10 15  ALKPHOS 66 54  BILITOT 1.1 0.5  PROT 5.7* 5.7*  ALBUMIN 2.8* 2.6*   No results for input(s): "LIPASE", "AMYLASE" in the last 168 hours. Recent Labs  Lab 09/30/22 0319  AMMONIA 28   CBC: Recent Labs  Lab 09/29/22 1943 09/29/22 1948 09/30/22 0319 10/01/22 0328 10/02/22 0249 10/04/22 0304  WBC 14.4*  --  13.8* 12.7* 15.5* 13.2*  NEUTROABS 11.1*  --   --   --   --   --   HGB 12.5 12.6 11.3* 11.4* 11.8* 12.5  HCT 38.0 37.0 34.4* 35.8* 37.3 37.7  MCV 95.7  --  93.0 95.0 94.2 93.1  PLT 197  --  196 201 271 278   Cardiac Enzymes: No  results for input(s): "CKTOTAL", "CKMB", "CKMBINDEX", "TROPONINI" in the last 168 hours. BNP: Invalid input(s): "POCBNP" CBG: No results for input(s): "GLUCAP" in the last 168 hours. D-Dimer No results for input(s): "DDIMER" in the last 72 hours. Hgb A1c No results for input(s): "HGBA1C" in the last 72 hours. Lipid Profile No results for input(s): "CHOL", "HDL", "LDLCALC", "TRIG", "CHOLHDL", "LDLDIRECT" in the last 72 hours. Thyroid function studies No results for input(s): "TSH", "T4TOTAL", "T3FREE", "THYROIDAB" in the last 72 hours.  Invalid input(s): "FREET3" Anemia work up No results for input(s): "VITAMINB12", "FOLATE", "FERRITIN", "TIBC", "IRON", "RETICCTPCT" in the last 72 hours. Urinalysis    Component Value Date/Time   COLORURINE YELLOW 04/03/2022 0125   APPEARANCEUR HAZY (A) 04/03/2022 0125   LABSPEC >1.046 (H) 04/03/2022 0125   PHURINE 5.0 04/03/2022 0125   GLUCOSEU NEGATIVE 04/03/2022 0125   HGBUR NEGATIVE 04/03/2022 0125   BILIRUBINUR NEGATIVE 04/03/2022 0125   KETONESUR 20 (A) 04/03/2022 0125   PROTEINUR 30 (A) 04/03/2022 0125   UROBILINOGEN 0.2 06/17/2012 1645   NITRITE POSITIVE (A) 04/03/2022 0125   LEUKOCYTESUR TRACE (A) 04/03/2022 0125   Sepsis Labs Recent Labs  Lab 09/30/22 0319 10/01/22 0328 10/02/22 0249 10/04/22 0304  WBC 13.8* 12.7* 15.5* 13.2*   Microbiology Recent Results (from the past 240 hour(s))  Resp panel by RT-PCR (RSV, Flu A&B, Covid) Anterior Nasal Swab     Status: None   Collection Time: 09/29/22  7:43 PM   Specimen: Anterior Nasal Swab  Result Value Ref Range Status   SARS Coronavirus 2 by RT PCR NEGATIVE NEGATIVE Final   Influenza A by PCR NEGATIVE NEGATIVE Final   Influenza B by PCR NEGATIVE NEGATIVE Final    Comment: (NOTE) The Xpert Xpress SARS-CoV-2/FLU/RSV plus assay is intended as an aid in the diagnosis of influenza from Nasopharyngeal swab specimens and should not be used as a sole basis for treatment. Nasal washings  and aspirates are unacceptable for Xpert Xpress SARS-CoV-2/FLU/RSV testing.  Fact Sheet for Patients: BloggerCourse.com  Fact Sheet for  Healthcare Providers: SeriousBroker.it  This test is not yet approved or cleared by the [place] and has been authorized for detection and/or diagnosis of SARS-CoV-2 by FDA under an Emergency Use Authorization (EUA). This EUA will remain in effect (meaning this test can be used) for the duration of the COVID-19 declaration under Section 564(b)(1) of the Act, 21 U.S.C. section 360bbb-3(b)(1), unless the authorization is terminated or revoked.     Resp Syncytial Virus by PCR NEGATIVE NEGATIVE Final    Comment: (NOTE) Fact Sheet for Patients: BloggerCourse.com  Fact Sheet for Healthcare Providers: SeriousBroker.it  This test is not yet approved or cleared by the [place] FDA and has been authorized for detection and/or diagnosis of SARS-CoV-2 by FDA under an Emergency Use Authorization (EUA). This EUA will remain in effect (meaning this test can be used) for the duration of the COVID-19 declaration under Section 564(b)(1) of the Act, 21 U.S.C. section 360bbb-3(b)(1), unless the authorization is terminated or revoked.  Performed at [facility], [address], [place], [place] [address]   Blood culture (routine x 2)     Status: None   Collection Time: 09/29/22  8:04 PM   Specimen: BLOOD RIGHT ARM  Result Value Ref Range Status   Specimen Description BLOOD RIGHT ARM  Final   Special Requests   Final    BOTTLES DRAWN AEROBIC AND ANAEROBIC Blood Culture adequate volume   Culture   Final    NO GROWTH 5 DAYS Performed at [facility], [address], [place], [place] [address]    Report Status 10/04/2022 FINAL  Final  Blood culture (routine x 2)     Status: None   Collection Time: 09/29/22  8:04 PM   Specimen: BLOOD  RIGHT HAND  Result Value Ref Range Status   Specimen Description BLOOD RIGHT HAND  Final   Special Requests   Final    BOTTLES DRAWN AEROBIC AND ANAEROBIC Blood Culture adequate volume   Culture   Final    NO GROWTH 5 DAYS Performed at [facility], [address], [place], [place] [address]    Report Status 10/04/2022 FINAL  Final     Time coordinating discharge: 35 minutes  SIGNED:   Glade Lloyd, MD  [facility] 10/04/2022, 11:23 AM

## 2022-10-04 NOTE — Consult Note (Signed)
Consultation Note Date: 10/04/2022   Patient Name: Mackenzie Peterson  DOB: 25-Sep-1924  MRN: [id number]  Age / Sex: 89 y.o., female  PCP: [name], MD Referring Physician: Glade Lloyd, MD  Reason for Consultation: Establishing goals of care  HPI/Patient Profile: 89 y.o. female  with past medical history of dementia, COPD, chronic hypoxemic respiratory failure on 2 L, HTN/HLD, hypothyroid, PVD, anxiety/depression, GERD, right hip nailing 2021, left hip Hemi 2023 admitted on 09/29/2022 with community-acquired pneumonia.   Clinical Assessment and Goals of Care: I have reviewed medical records including EPIC notes, labs and imaging, received report from RN, assessed the patient.  Mrs. Attebery is resting quietly in bed.  She appears elderly, but better than anticipated for stated age.  She greets me, making and somewhat keeping eye contact.  She has known dementia, but is able to tell me her name.  I believe that she can make her basic needs known.  There is no family at bedside at this time.  Reassurance is provided.  Call to daughter-in-law, Vianne Bulls, to discuss diagnosis prognosis, GOC, EOL wishes, disposition and options.  I introduced Palliative Medicine as specialized medical care for people living with serious illness. It focuses on providing relief from the symptoms and stress of a serious illness. The goal is to improve quality of life for both the patient and the family.  We focused on their current illness.  We talked about community acquired pneumonia and the treatment plan.  Daughter-in-law shares that they want short-term rehab with ultimate goal of returning home.  Daughter-in-law shares that Mrs. Snuffer will bounce back quickly.  Daughter-in-law shares that they have CAP aid 40 hours/week.  The natural disease trajectory and expectations at EOL were discussed.  Advanced directives, concepts  specific to code status, artifical feeding and hydration, and rehospitalization were considered and discussed.  We talked about the concept of "treat the treatable, but allow natural passing.  At this point daughter-in-law states that they would want full scope/full code.  She will have discussions with patient's son, her husband.  She states that they have not discussed this as a family in the past.  Palliative Care services outpatient were explained and offered.  We talked about the benefits of outpatient palliative services.  Family declines at this time.  Discussed the importance of continued conversation with family and the medical providers regarding overall plan of care and treatment options, ensuring decisions are within the context of the patient's values and GOCs.  Questions and concerns were addressed.  The family was encouraged to call with questions or concerns.  PMT will continue to support holistically.  Conference with attending, bedside nursing staff, transition of care team related to patient condition, needs, goals of care, disposition.   HCPOA NEXT OF KIN -son, [name] and daughter-in-law Vianne Bulls    SUMMARY OF RECOMMENDATIONS   At this point continue full scope/full code Short-term rehab at [facility] farm Ultimate goal of returning home Declines outpatient palliative services    Code Status/Advance Care Planning:  Full code  Symptom Management:  Per hospitalist, no additional needs at this time.  Palliative Prophylaxis:  Frequent Pain Assessment and Oral Care  Additional Recommendations (Limitations, Scope, Preferences): Full Scope Treatment  Psycho-social/Spiritual:  Desire for further Chaplaincy support:no Additional Recommendations: Caregiving  Support/Resources  Prognosis:  Unable to determine, based on outcomes.  6 to 12 months or more would not be surprising based on current functional status, somewhat limited chronic illness  burden.  Discharge Planning: Short-term rehab at [facility] farm      Primary Diagnoses: Present on Admission:  CAP (community acquired pneumonia)  Chronic respiratory failure with hypoxia (HCC)  Dementia (HCC)  HLD (hyperlipidemia)  Acute metabolic encephalopathy   I have reviewed the medical record, interviewed the patient and family, and examined the patient. The following aspects are pertinent.  Past Medical History:  Diagnosis Date   Alzheimer disease (HCC)    Asthma    Chronic bronchitis (HCC)    Dysthymic disorder    Emphysema    Hyperlipidemia    Hypertension    hypothyroid    PVD (peripheral vascular disease) (HCC)    Social History   Socioeconomic History   Marital status: Widowed    Spouse name: Not on file   Number of children: Not on file   Years of education: Not on file   Highest education level: Not on file  Occupational History   Not on file  Tobacco Use   Smoking status: Never   Smokeless tobacco: Never  Substance and Sexual Activity   Alcohol use: No   Drug use: No   Sexual activity: Not on file  Other Topics Concern   Not on file  Social History Narrative   Not on file   Social Determinants of Health   Financial Resource Strain: Not on file  Food Insecurity: Not on file  Transportation Needs: Not on file  Physical Activity: Not on file  Stress: Not on file  Social Connections: Not on file   History reviewed. No pertinent family history. Scheduled Meds:  amLODipine  5 mg Oral Daily   doxycycline  100 mg Oral Q12H   guaiFENesin  600 mg Oral BID   ipratropium-albuterol  3 mL Nebulization BID   levothyroxine  25 mcg Oral QAC breakfast   memantine  10 mg Oral BID   predniSONE  40 mg Oral Q breakfast   Continuous Infusions: PRN Meds:.acetaminophen **OR** acetaminophen, albuterol, LORazepam, phenol Medications Prior to Admission:  Prior to Admission medications   Medication Sig Start Date End Date Taking? Authorizing Provider   albuterol (PROVENTIL) (2.5 MG/3ML) 0.083% nebulizer solution Take 2.5 mg by nebulization 3 (three) times daily as needed for wheezing or shortness of breath.    Yes [provider]  ALPRAZolam (XANAX) 0.5 MG tablet Take 1 tablet (0.5 mg total) by mouth 3 (three) times daily as needed for anxiety. Patient taking differently: Take 0.5 mg by mouth 2 (two) times daily. 12/04/19  Yes Marinda Elk, MD  aspirin EC 81 MG tablet Take 81 mg by mouth daily. Swallow whole.   Yes [provider]  cetirizine (ZYRTEC) 10 MG tablet Take 10 mg by mouth at bedtime.   Yes [provider]  docusate sodium (COLACE) 100 MG capsule Take 1 capsule (100 mg total) by mouth 2 (two) times daily. 08/09/21  Yes [place], Eric J, DO  donepezil (ARICEPT) 10 MG tablet Take 10 mg by mouth at bedtime. 09/10/19  Yes [provider]  levothyroxine (SYNTHROID, LEVOTHROID)  25 MCG tablet Take 25 mcg by mouth daily before breakfast.    Yes [provider]  memantine (NAMENDA) 10 MG tablet Take 10 mg by mouth 2 (two) times daily.   Yes [provider]  Multiple Vitamins-Minerals (ONE-A-DAY PROACTIVE 65+) TABS Take 1 tablet by mouth daily with breakfast.   Yes [provider]  nitroGLYCERIN (NITROSTAT) 0.4 MG SL tablet Place 0.4 mg under the tongue every 5 (five) minutes as needed for chest pain.    Yes [provider]  omeprazole (PRILOSEC) 20 MG capsule TAKE 1 CAPSULE BY MOUTH 30 TO 60 MINUTES BEFORE THE FIRST AND LAST MEALS OF THE DAY Patient taking differently: Take 20 mg by mouth 2 (two) times daily before a meal.   Yes Ramaswamy, Murali, MD  OXYGEN Inhale 2 L/min into the lungs at bedtime.   Yes [provider]  pravastatin (PRAVACHOL) 20 MG tablet Take 20 mg by mouth at bedtime. 04/17/19  Yes [provider]  QUEtiapine (SEROQUEL) 100 MG tablet Take 100 mg by mouth at bedtime.   Yes [provider]  sertraline (ZOLOFT) 25 MG tablet  Take 25 mg by mouth at bedtime. 04/20/19  Yes [provider]  solifenacin (VESICARE) 5 MG tablet Take 5 mg by mouth at bedtime.    Yes [provider]  traZODone (DESYREL) 50 MG tablet Take 50 mg by mouth at bedtime.   Yes [provider]  acetaminophen (TYLENOL) 500 MG tablet Take 500-1,000 mg by mouth every 6 (six) hours as needed for mild pain or headache. Patient not taking: Reported on 09/30/2022    [provider]  amLODipine (NORVASC) 5 MG tablet Take 1 tablet (5 mg total) by mouth daily. 10/05/22   Glade Lloyd, MD  guaiFENesin (MUCINEX) 600 MG 12 hr tablet Take 1 tablet (600 mg total) by mouth 2 (two) times daily. 10/04/22   Glade Lloyd, MD  predniSONE (DELTASONE) 20 MG tablet Take 2 tablets (40 mg total) by mouth daily with breakfast for 5 days. 10/05/22 10/10/22  Glade Lloyd, MD   No Known Allergies Review of Systems  Unable to perform ROS: Dementia    Physical Exam Vitals and nursing note reviewed.  Constitutional:      General: She is not in acute distress.    Appearance: She is not ill-appearing.  Pulmonary:     Effort: Pulmonary effort is normal. No respiratory distress.  Neurological:     Mental Status: She is alert.     Comments: Known dementia, but able to tell me her name  Psychiatric:        Mood and Affect: Mood normal.        Behavior: Behavior normal.     Comments: Calm and cooperative, not fearful     Vital Signs: BP (!) 142/60 (BP Location: Left Arm)   Pulse 86   Temp 99 F (37.2 C) (Axillary)   Resp 18   Ht 5' (1.524 m)   Wt 48.2 kg   SpO2 96%   BMI 20.75 kg/m  Pain Scale: Faces   Pain Score: 0-No pain   SpO2: SpO2: 96 % O2 Device:SpO2: 96 % O2 Flow Rate: .O2 Flow Rate (L/min): 2 L/min  IO: Intake/output summary:  Intake/Output Summary (Last 24 hours) at 10/04/2022 1331 Last data filed at 10/04/2022 1300 Gross per 24 hour  Intake 240 ml  Output --  Net 240 ml    LBM: Last BM Date : 10/01/22 Baseline  Weight: Weight: 54.4 kg  Most recent weight: Weight: 48.2 kg     Palliative Assessment/Data:     Time In: 1050 Time Out: 1130 Time Total: 40 minutes  Greater than 50%  of this time was spent counseling and coordinating care related to the above assessment and plan.  Signed by: Katheran Awe, NP   Please contact Palliative Medicine Team phone at [phone] for questions and concerns.  For individual provider: See Loretha Stapler

## 2022-10-04 NOTE — Care Management Important Message (Signed)
Important Message  Patient Details  Name: Mackenzie Peterson MRN: [id number] Date of Birth: Dec 29, 1924   Medicare Important Message Given:  Yes     Renie Ora 10/04/2022, 9:15 AM

## 2022-10-04 NOTE — TOC Transition Note (Signed)
Transition of Care [facility]) - CM/SW Discharge Note   Patient Details  Name: Naia Pasqualone MRN: [id number] Date of Birth: 1924/04/27  [facility]) CM/SW Contact:  Leander Rams, LCSW Phone Number: 10/04/2022, 2:38 PM   Clinical Narrative:    Patient will DC to: [facility]  Anticipated DC date: 10/04/2022 Family notified: Natalia Leatherwood Transport by: Sharin Mons   Per MD patient ready for DC to [facility]. RN, patient, patient's family, and facility notified of DC. Discharge Summary and FL2 sent to facility. RN to call report prior to discharge [phone]. DC packet on chart. Ambulance transport requested for patient.   CSW will sign off for now as social work intervention is no longer needed. Please consult [place] again if new needs arise.    Final next level of care: Skilled Nursing Facility Barriers to Discharge: No Barriers Identified   Patient Goals and CMS Choice      Discharge Placement                Patient chooses bed at: [facility] Patient to be transferred to facility by: [facility] Name of family member notified: Natalia Leatherwood Patient and family notified of of transfer: 10/04/22  Discharge Plan and Services Additional resources added to the After Visit Summary for                                       Social Determinants of Health (SDOH) Interventions SDOH Screenings   Tobacco Use: Low Risk  (10/04/2022)     Readmission Risk Interventions     No data to display          Oletta Lamas, MSW, LCSWA, LCASA Transitions of Care  Clinical Social Worker I

## 2022-10-04 NOTE — TOC Progression Note (Signed)
Transition of Care [facility]) - Progression Note    Patient Details  Name: Mackenzie Peterson MRN: [id number] Date of Birth: 13-Sep-1924  Transition of Care [facility]) CM/SW Contact  Leander Rams, LCSW Phone Number: 10/04/2022, 12:12 PM  Clinical Narrative:    CSW spoke with Lowella Bandy from [facility]. Lowella Bandy confirmed that pt can admit to facility this afternoon.   TOC following.    Expected Discharge Plan: Skilled Nursing Facility Barriers to Discharge: Continued Medical Work up  Expected Discharge Plan and Services       Living arrangements for the past 2 months: Single Family Home Expected Discharge Date: 10/04/22                                     Social Determinants of Health (SDOH) Interventions SDOH Screenings   Tobacco Use: Low Risk  (09/29/2022)    Readmission Risk Interventions     No data to display         Oletta Lamas, MSW, LCSWA, LCASA Transitions of Care  Clinical Social Worker I

## 2022-11-20 ENCOUNTER — Other Ambulatory Visit: Payer: Self-pay

## 2022-11-20 ENCOUNTER — Emergency Department (HOSPITAL_BASED_OUTPATIENT_CLINIC_OR_DEPARTMENT_OTHER): Payer: Medicare Other

## 2022-11-20 ENCOUNTER — Encounter (HOSPITAL_BASED_OUTPATIENT_CLINIC_OR_DEPARTMENT_OTHER): Payer: Self-pay | Admitting: Pediatrics

## 2022-11-20 ENCOUNTER — Emergency Department (HOSPITAL_BASED_OUTPATIENT_CLINIC_OR_DEPARTMENT_OTHER)
Admission: EM | Admit: 2022-11-20 | Discharge: 2022-11-21 | Disposition: A | Discharge status: Home / Self Care | Payer: Medicare Other | Attending: Emergency Medicine | Admitting: Emergency Medicine

## 2022-11-20 DIAGNOSIS — Y92002 Bathroom of unspecified non-institutional (private) residence single-family (private) house as the place of occurrence of the external cause: Secondary | ICD-10-CM | POA: Diagnosis not present

## 2022-11-20 DIAGNOSIS — I10 Essential (primary) hypertension: Secondary | ICD-10-CM | POA: Diagnosis not present

## 2022-11-20 DIAGNOSIS — J449 Chronic obstructive pulmonary disease, unspecified: Secondary | ICD-10-CM | POA: Diagnosis not present

## 2022-11-20 DIAGNOSIS — M25551 Pain in right hip: Secondary | ICD-10-CM | POA: Diagnosis present

## 2022-11-20 DIAGNOSIS — Z1152 Encounter for screening for COVID-19: Secondary | ICD-10-CM | POA: Insufficient documentation

## 2022-11-20 DIAGNOSIS — Z79899 Other long term (current) drug therapy: Secondary | ICD-10-CM | POA: Insufficient documentation

## 2022-11-20 DIAGNOSIS — M25552 Pain in left hip: Secondary | ICD-10-CM | POA: Diagnosis not present

## 2022-11-20 DIAGNOSIS — J45909 Unspecified asthma, uncomplicated: Secondary | ICD-10-CM | POA: Diagnosis not present

## 2022-11-20 DIAGNOSIS — W19XXXA Unspecified fall, initial encounter: Secondary | ICD-10-CM | POA: Diagnosis not present

## 2022-11-20 DIAGNOSIS — Z7982 Long term (current) use of aspirin: Secondary | ICD-10-CM | POA: Insufficient documentation

## 2022-11-20 DIAGNOSIS — I6782 Cerebral ischemia: Secondary | ICD-10-CM | POA: Insufficient documentation

## 2022-11-20 DIAGNOSIS — E039 Hypothyroidism, unspecified: Secondary | ICD-10-CM | POA: Insufficient documentation

## 2022-11-20 MED ORDER — IPRATROPIUM-ALBUTEROL 0.5-2.5 (3) MG/3ML IN SOLN
3.0000 mL | Freq: Once | RESPIRATORY_TRACT | Status: AC
  Administered 2022-11-21: 3 mL via RESPIRATORY_TRACT
  Filled 2022-11-20: qty 3

## 2022-11-20 NOTE — ED Provider Notes (Signed)
[facility] [place] [facility] Provider Note   CSN: [id number] Arrival date & time: 11/20/22  1654     History  Chief Complaint  Patient presents with   Mackenzie Peterson    Mackenzie Peterson is a 89 y.o. female with PMH as listed below who presents with her daughter-in-law who provides additional history. She has alzheimer's and COPD, was recently admitted and treated for PNA then DC'd to rehab, got out of rehab last Friday. Since that time she has been acting mostly at her baseline until today when she fell while trying to go to the bathroom around 1 am and had an unwitnessed fall. She laid on the ground approx 15-20 min. Patient is very poor historian d/t severe dementia but did endorse hitting her head. She denies LOC, neck pain, chest pain. She has prior hip fracture and patient endorses pain in her bilateral hips as well as surpapubic region.    Past Medical History:  Diagnosis Date   Alzheimer disease (HCC)    Asthma    Chronic bronchitis (HCC)    Dysthymic disorder    Emphysema    Hyperlipidemia    Hypertension    hypothyroid    PVD (peripheral vascular disease) (HCC)        Home Medications Prior to Admission medications   Medication Sig Start Date End Date Taking? Authorizing Provider  acetaminophen (TYLENOL) 500 MG tablet Take 500-1,000 mg by mouth every 6 (six) hours as needed for mild pain or headache. Patient not taking: Reported on 09/30/2022    [provider]  albuterol (PROVENTIL) (2.5 MG/3ML) 0.083% nebulizer solution Take 2.5 mg by nebulization 3 (three) times daily as needed for wheezing or shortness of breath.     [provider]  ALPRAZolam Prudy Feeler) 0.5 MG tablet Take 1 tablet (0.5 mg total) by mouth 3 (three) times daily as needed for anxiety. Patient taking differently: Take 0.5 mg by mouth 2 (two) times daily. 12/04/19   Marinda Elk, MD  amLODipine (NORVASC) 5 MG tablet Take 1 tablet (5 mg total) by mouth daily.  10/05/22   Glade Lloyd, MD  aspirin EC 81 MG tablet Take 81 mg by mouth daily. Swallow whole.    [provider]  cetirizine (ZYRTEC) 10 MG tablet Take 10 mg by mouth at bedtime.    [provider]  docusate sodium (COLACE) 100 MG capsule Take 1 capsule (100 mg total) by mouth 2 (two) times daily. 08/09/21   [place], Eric J, DO  donepezil (ARICEPT) 10 MG tablet Take 10 mg by mouth at bedtime. 09/10/19   [provider]  guaiFENesin (MUCINEX) 600 MG 12 hr tablet Take 1 tablet (600 mg total) by mouth 2 (two) times daily. 10/04/22   Glade Lloyd, MD  levothyroxine (SYNTHROID, LEVOTHROID) 25 MCG tablet Take 25 mcg by mouth daily before breakfast.     [provider]  memantine (NAMENDA) 10 MG tablet Take 10 mg by mouth 2 (two) times daily.    [provider]  Multiple Vitamins-Minerals (ONE-A-DAY PROACTIVE 65+) TABS Take 1 tablet by mouth daily with breakfast.    [provider]  nitroGLYCERIN (NITROSTAT) 0.4 MG SL tablet Place 0.4 mg under the tongue every 5 (five) minutes as needed for chest pain.     [provider]  omeprazole (PRILOSEC) 20 MG capsule TAKE 1 CAPSULE BY MOUTH 30 TO 60 MINUTES BEFORE THE FIRST AND LAST MEALS OF THE DAY Patient taking differently: Take 20 mg by  mouth 2 (two) times daily before a meal.    Kalman Shan, MD  OXYGEN Inhale 2 L/min into the lungs at bedtime.    [provider]  pravastatin (PRAVACHOL) 20 MG tablet Take 20 mg by mouth at bedtime. 04/17/19   [provider]  QUEtiapine (SEROQUEL) 100 MG tablet Take 100 mg by mouth at bedtime.    [provider]  sertraline (ZOLOFT) 25 MG tablet Take 25 mg by mouth at bedtime. 04/20/19   [provider]  solifenacin (VESICARE) 5 MG tablet Take 5 mg by mouth at bedtime.     [provider]  traZODone (DESYREL) 50 MG tablet Take 50 mg by mouth at bedtime.    [provider]      Allergies    Patient has no  known allergies.    Review of Systems   Review of Systems A 10 point review of systems was performed and is negative unless otherwise reported in HPI.  Physical Exam Updated Vital Signs BP (!) 181/73 (BP Location: Right Arm)   Pulse 71   Temp 98.3 F (36.8 C) (Axillary)   Resp 16   SpO2 97%  Physical Exam General: Normal appearing elderly female, lying in bed.  HEENT: PERRLA, EOMI, Sclera anicteric, MMM, trachea midline.  Cardiology: RRR, no murmurs/rubs/gallops. BL radial and DP pulses equal bilaterally. No chest wall e/o trauma, no crepitus or TTP.  Resp: Normal respiratory rate and effort. CTAB, no wheezes, rhonchi, crackles.  Abd: Soft, mildly tender in suprapubic region, non-distended. No rebound tenderness or guarding. No signs of trauma on abdomen. Pelvis: Stable, nontender. MSK: FROM of the BL hips/knees. She reports TTP diffusely throughout L and R anterior hips with no gross deformities or signs of injury. Soft compartments. No ecchymoses or hematoma.  Skin: warm, dry.  Back: midline C T or L spine TTP or stepoffs/deformities Neuro: A&Ox1 (baseline), CNs II-XII grossly intact. MAEs. Sensation grossly intact.  Psych: Normal mood and affect. Pleasant, cooperative.  ED Results / Procedures / Treatments   Labs (all labs ordered are listed, but only abnormal results are displayed) Labs Reviewed  SARS CORONAVIRUS 2 BY RT PCR  CBC WITH DIFFERENTIAL/PLATELET  COMPREHENSIVE METABOLIC PANEL  URINALYSIS, ROUTINE W REFLEX MICROSCOPIC    EKG None  Radiology DG Hips Bilat W or Wo Pelvis 5 Views  Result Date: 11/20/2022 CLINICAL DATA:  Fall. EXAM: DG HIP (WITH OR WITHOUT PELVIS) 5+V BILAT COMPARISON:  08/07/2021. FINDINGS: There is no evidence of hip fracture or dislocation. Fixation hardware is noted in the proximal right femur. Total hip arthroplasty changes are present on the left. There is no evidence of hardware loosening. Degenerative changes are noted in the lower lumbar  spine. IMPRESSION: No acute fracture or dislocation. Electronically Signed   By: Thornell Sartorius M.D.   On: 11/20/2022 21:33   CT Head Wo Contrast  Result Date: 11/20/2022 CLINICAL DATA:  Neck trauma (Age >= 65y); Head trauma, minor (Age >= 65y). EXAM: CT HEAD WITHOUT CONTRAST CT CERVICAL SPINE WITHOUT CONTRAST TECHNIQUE: Multidetector CT imaging of the head and cervical spine was performed following the standard protocol without intravenous contrast. Multiplanar CT image reconstructions of the cervical spine were also generated. RADIATION DOSE REDUCTION: This exam was performed according to the departmental dose-optimization program which includes automated exposure control, adjustment of the mA and/or kV according to patient size and/or use of iterative reconstruction technique. COMPARISON:  Head CT 04/03/2022.  Cervical spine CT 11/28/2019. FINDINGS: CT HEAD FINDINGS Brain: No  acute hemorrhage. Unchanged mild chronic small-vessel disease and dystrophic calcification along the inferior left parietal lobe. Cortical gray-white differentiation is otherwise preserved. Prominence of the ventricles and sulci within expected range for age. No hydrocephalus or extra-axial collection. No mass effect or midline shift. Vascular: No hyperdense vessel or unexpected calcification. Skull: No calvarial fracture or suspicious bone lesion. Skull base is unremarkable. Sinuses/Orbits: No acute finding. Other: None. CT CERVICAL SPINE FINDINGS Alignment: No traumatic malalignment. Skull base and vertebrae: No acute fracture. Normal craniocervical junction. No suspicious bone lesions. Soft tissues and spinal canal: No prevertebral fluid or swelling. No visible canal hematoma. Disc levels: Mild cervical spondylosis without high-grade spinal canal stenosis. Upper chest: Unchanged diffuse nodular opacities in the lung apices. Other: None. IMPRESSION: 1. No acute intracranial abnormality. 2. No acute cervical spine fracture or traumatic  malalignment. 3. Unchanged diffuse nodular opacities in the lung apices. Electronically Signed   By: Orvan Falconer M.D.   On: 11/20/2022 21:20   CT Cervical Spine Wo Contrast  Result Date: 11/20/2022 CLINICAL DATA:  Neck trauma (Age >= 65y); Head trauma, minor (Age >= 65y). EXAM: CT HEAD WITHOUT CONTRAST CT CERVICAL SPINE WITHOUT CONTRAST TECHNIQUE: Multidetector CT imaging of the head and cervical spine was performed following the standard protocol without intravenous contrast. Multiplanar CT image reconstructions of the cervical spine were also generated. RADIATION DOSE REDUCTION: This exam was performed according to the departmental dose-optimization program which includes automated exposure control, adjustment of the mA and/or kV according to patient size and/or use of iterative reconstruction technique. COMPARISON:  Head CT 04/03/2022.  Cervical spine CT 11/28/2019. FINDINGS: CT HEAD FINDINGS Brain: No acute hemorrhage. Unchanged mild chronic small-vessel disease and dystrophic calcification along the inferior left parietal lobe. Cortical gray-white differentiation is otherwise preserved. Prominence of the ventricles and sulci within expected range for age. No hydrocephalus or extra-axial collection. No mass effect or midline shift. Vascular: No hyperdense vessel or unexpected calcification. Skull: No calvarial fracture or suspicious bone lesion. Skull base is unremarkable. Sinuses/Orbits: No acute finding. Other: None. CT CERVICAL SPINE FINDINGS Alignment: No traumatic malalignment. Skull base and vertebrae: No acute fracture. Normal craniocervical junction. No suspicious bone lesions. Soft tissues and spinal canal: No prevertebral fluid or swelling. No visible canal hematoma. Disc levels: Mild cervical spondylosis without high-grade spinal canal stenosis. Upper chest: Unchanged diffuse nodular opacities in the lung apices. Other: None. IMPRESSION: 1. No acute intracranial abnormality. 2. No acute  cervical spine fracture or traumatic malalignment. 3. Unchanged diffuse nodular opacities in the lung apices. Electronically Signed   By: Orvan Falconer M.D.   On: 11/20/2022 21:20    Procedures Procedures    Medications Ordered in ED Medications  ipratropium-albuterol (DUONEB) 0.5-2.5 (3) MG/3ML nebulizer solution 3 mL (has no administration in time range)    ED Course/ Medical Decision Making/ A&P                          Medical Decision Making Amount and/or Complexity of Data Reviewed Labs: ordered. Radiology: ordered. Decision-making details documented in ED Course.  Risk Prescription drug management.    This patient presents to the ED for concern of fall, BL hip pain, suprapubic pain, this involves an extensive number of treatment options, and is a complaint that carries with it a high risk of complications and morbidity.  I considered the following differential and admission for this acute, potentially life threatening condition.   MDM:    DDX for trauma includes  but is not limited to:  -Head Injury such as skull fx or ICH - elderly pt with dementia reports head trauma, will get CTH -Chest Injury - no e/o chest wall injury or TTP of chest wall.  -Abdominal injury - she does have suprapubic TTP but she has no signs of trauma on abdomen, this is less likely related to fall and more likely related to a possible cystitis.  -Spinal Cord or Vertebral injury - no midline C-spine TTP on exam but not reliable historian -Fractures - she has good ROM of knees/hips bilaterally, low c/f fracture, will get XR to r/o -Consider causes for falls in elderly patients such as hypo/hyperglycemia, electrolyte derangements, or infections such as URI, PNA, or UTI. Patient is coughing on my exam with expiratory wheezing, daughter-in-law states she does normally cough but it is possibly worse than normal. She has no resp distress/hypoxia. She does have suprapubic tenderness which could be cystitis  and will get cathed urine sample.   Clinical Course as of 11/21/22 0012  Tue Nov 21, 2022  0000 DG Chest 2 View 1. No active cardiopulmonary disease. 2. Widespread small pulmonary nodules throughout the lungs as seen on multiple previous exams.   [HN]    Clinical Course User Index [HN] Loetta Rough, MD    Labs: I Ordered labs, which are pending at time of signout  Imaging Studies ordered: I ordered imaging studies including CXR, CTH, CT C-spine I independently visualized and interpreted imaging. I agree with the radiologist interpretation  Additional history obtained from chart review, daughter-in-law at bedside  Social Determinants of Health: Lives independently  Disposition:  Patient is signed out to the oncoming ED physician Dr. Nicanor Alcon who is made aware of her history, presentation, exam, workup, and plan. Plan is to obtain basic labs/urine, and if normal can be discharged. Traumatic w/u is negative.   Co morbidities that complicate the patient evaluation  Past Medical History:  Diagnosis Date   Alzheimer disease (HCC)    Asthma    Chronic bronchitis (HCC)    Dysthymic disorder    Emphysema    Hyperlipidemia    Hypertension    hypothyroid    PVD (peripheral vascular disease) (HCC)      Medicines Meds ordered this encounter  Medications   ipratropium-albuterol (DUONEB) 0.5-2.5 (3) MG/3ML nebulizer solution 3 mL    I have reviewed the patients home medicines and have made adjustments as needed  Problem List / ED Course: Problem List Items Addressed This Visit   None Visit Diagnoses     Fall, initial encounter    -  Primary                   This note was created using dictation software, which may contain spelling or grammatical errors.    Loetta Rough, MD 11/21/22 [phone]

## 2022-11-20 NOTE — ED Provider Notes (Incomplete)
[facility] Provider Note   CSN: [id number] Arrival date & time: 11/20/22  1654     History {Add pertinent medical, surgical, social history, OB history to HPI:1} Chief Complaint  Patient presents with  . Fall    Mackenzie Peterson is a 89 y.o. female with PMH as listed below who presents with ***.    Past Medical History:  Diagnosis Date  . Alzheimer disease (HCC)   . Asthma   . Chronic bronchitis (HCC)   . Dysthymic disorder   . Emphysema   . Hyperlipidemia   . Hypertension   . hypothyroid   . PVD (peripheral vascular disease) (HCC)        Home Medications Prior to Admission medications   Medication Sig Start Date End Date Taking? Authorizing Provider  acetaminophen (TYLENOL) 500 MG tablet Take 500-1,000 mg by mouth every 6 (six) hours as needed for mild pain or headache. Patient not taking: Reported on 09/30/2022    [provider]  albuterol (PROVENTIL) (2.5 MG/3ML) 0.083% nebulizer solution Take 2.5 mg by nebulization 3 (three) times daily as needed for wheezing or shortness of breath.     [provider]  ALPRAZolam Prudy Feeler) 0.5 MG tablet Take 1 tablet (0.5 mg total) by mouth 3 (three) times daily as needed for anxiety. Patient taking differently: Take 0.5 mg by mouth 2 (two) times daily. 12/04/19   Marinda Elk, MD  amLODipine (NORVASC) 5 MG tablet Take 1 tablet (5 mg total) by mouth daily. 10/05/22   Glade Lloyd, MD  aspirin EC 81 MG tablet Take 81 mg by mouth daily. Swallow whole.    [provider]  cetirizine (ZYRTEC) 10 MG tablet Take 10 mg by mouth at bedtime.    [provider]  docusate sodium (COLACE) 100 MG capsule Take 1 capsule (100 mg total) by mouth 2 (two) times daily. 08/09/21   [place], Eric J, DO  donepezil (ARICEPT) 10 MG tablet Take 10 mg by mouth at bedtime. 09/10/19   [provider]  guaiFENesin (MUCINEX) 600 MG 12 hr tablet Take 1 tablet (600 mg  total) by mouth 2 (two) times daily. 10/04/22   Glade Lloyd, MD  levothyroxine (SYNTHROID, LEVOTHROID) 25 MCG tablet Take 25 mcg by mouth daily before breakfast.     [provider]  memantine (NAMENDA) 10 MG tablet Take 10 mg by mouth 2 (two) times daily.    [provider]  Multiple Vitamins-Minerals (ONE-A-DAY PROACTIVE 65+) TABS Take 1 tablet by mouth daily with breakfast.    [provider]  nitroGLYCERIN (NITROSTAT) 0.4 MG SL tablet Place 0.4 mg under the tongue every 5 (five) minutes as needed for chest pain.     [provider]  omeprazole (PRILOSEC) 20 MG capsule TAKE 1 CAPSULE BY MOUTH 30 TO 60 MINUTES BEFORE THE FIRST AND LAST MEALS OF THE DAY Patient taking differently: Take 20 mg by mouth 2 (two) times daily before a meal.    Kalman Shan, MD  OXYGEN Inhale 2 L/min into the lungs at bedtime.    [provider]  pravastatin (PRAVACHOL) 20 MG tablet Take 20 mg by mouth at bedtime. 04/17/19   [provider]  QUEtiapine (SEROQUEL) 100 MG tablet Take 100 mg by mouth at bedtime.    [provider]  sertraline (ZOLOFT) 25 MG tablet Take 25 mg by mouth at bedtime. 04/20/19   [provider]  solifenacin (VESICARE) 5 MG tablet Take 5 mg  by mouth at bedtime.     [provider]  traZODone (DESYREL) 50 MG tablet Take 50 mg by mouth at bedtime.    [provider]      Allergies    Patient has no known allergies.    Review of Systems   Review of Systems A 10 point review of systems was performed and is negative unless otherwise reported in HPI.  Physical Exam Updated Vital Signs BP (!) 181/73 (BP Location: Right Arm)   Pulse 71   Temp 98.3 F (36.8 C) (Axillary)   Resp 16   SpO2 97%  Physical Exam General: Normal appearing {Desc; female/female:11659}, lying in bed.  HEENT: PERRLA, Sclera anicteric, MMM, trachea midline.  Cardiology: RRR, no murmurs/rubs/gallops. BL radial and DP pulses equal  bilaterally.  Resp: Normal respiratory rate and effort. CTAB, no wheezes, rhonchi, crackles.  Abd: Soft, non-tender, non-distended. No rebound tenderness or guarding.  GU: Deferred. MSK: No peripheral edema or signs of trauma. Extremities without deformity or TTP. No cyanosis or clubbing. Skin: warm, dry. No rashes or lesions. Back: No CVA tenderness Neuro: A&Ox4, CNs II-XII grossly intact. MAEs. Sensation grossly intact.  Psych: Normal mood and affect.   ED Results / Procedures / Treatments   Labs (all labs ordered are listed, but only abnormal results are displayed) Labs Reviewed  SARS CORONAVIRUS 2 BY RT PCR  CBC WITH DIFFERENTIAL/PLATELET  COMPREHENSIVE METABOLIC PANEL  URINALYSIS, ROUTINE W REFLEX MICROSCOPIC    EKG None  Radiology DG Hips Bilat W or Wo Pelvis 5 Views  Result Date: 11/20/2022 CLINICAL DATA:  Fall. EXAM: DG HIP (WITH OR WITHOUT PELVIS) 5+V BILAT COMPARISON:  08/07/2021. FINDINGS: There is no evidence of hip fracture or dislocation. Fixation hardware is noted in the proximal right femur. Total hip arthroplasty changes are present on the left. There is no evidence of hardware loosening. Degenerative changes are noted in the lower lumbar spine. IMPRESSION: No acute fracture or dislocation. Electronically Signed   By: Thornell Sartorius M.D.   On: 11/20/2022 21:33   CT Head Wo Contrast  Result Date: 11/20/2022 CLINICAL DATA:  Neck trauma (Age >= 65y); Head trauma, minor (Age >= 65y). EXAM: CT HEAD WITHOUT CONTRAST CT CERVICAL SPINE WITHOUT CONTRAST TECHNIQUE: Multidetector CT imaging of the head and cervical spine was performed following the standard protocol without intravenous contrast. Multiplanar CT image reconstructions of the cervical spine were also generated. RADIATION DOSE REDUCTION: This exam was performed according to the departmental dose-optimization program which includes automated exposure control, adjustment of the mA and/or kV according to patient size  and/or use of iterative reconstruction technique. COMPARISON:  Head CT 04/03/2022.  Cervical spine CT 11/28/2019. FINDINGS: CT HEAD FINDINGS Brain: No acute hemorrhage. Unchanged mild chronic small-vessel disease and dystrophic calcification along the inferior left parietal lobe. Cortical gray-white differentiation is otherwise preserved. Prominence of the ventricles and sulci within expected range for age. No hydrocephalus or extra-axial collection. No mass effect or midline shift. Vascular: No hyperdense vessel or unexpected calcification. Skull: No calvarial fracture or suspicious bone lesion. Skull base is unremarkable. Sinuses/Orbits: No acute finding. Other: None. CT CERVICAL SPINE FINDINGS Alignment: No traumatic malalignment. Skull base and vertebrae: No acute fracture. Normal craniocervical junction. No suspicious bone lesions. Soft tissues and spinal canal: No prevertebral fluid or swelling. No visible canal hematoma. Disc levels: Mild cervical spondylosis without high-grade spinal canal stenosis. Upper chest: Unchanged diffuse nodular opacities in the lung apices. Other: None. IMPRESSION: 1. No acute intracranial abnormality. 2. No  acute cervical spine fracture or traumatic malalignment. 3. Unchanged diffuse nodular opacities in the lung apices. Electronically Signed   By: Orvan Falconer M.D.   On: 11/20/2022 21:20   CT Cervical Spine Wo Contrast  Result Date: 11/20/2022 CLINICAL DATA:  Neck trauma (Age >= 65y); Head trauma, minor (Age >= 65y). EXAM: CT HEAD WITHOUT CONTRAST CT CERVICAL SPINE WITHOUT CONTRAST TECHNIQUE: Multidetector CT imaging of the head and cervical spine was performed following the standard protocol without intravenous contrast. Multiplanar CT image reconstructions of the cervical spine were also generated. RADIATION DOSE REDUCTION: This exam was performed according to the departmental dose-optimization program which includes automated exposure control, adjustment of the mA  and/or kV according to patient size and/or use of iterative reconstruction technique. COMPARISON:  Head CT 04/03/2022.  Cervical spine CT 11/28/2019. FINDINGS: CT HEAD FINDINGS Brain: No acute hemorrhage. Unchanged mild chronic small-vessel disease and dystrophic calcification along the inferior left parietal lobe. Cortical gray-white differentiation is otherwise preserved. Prominence of the ventricles and sulci within expected range for age. No hydrocephalus or extra-axial collection. No mass effect or midline shift. Vascular: No hyperdense vessel or unexpected calcification. Skull: No calvarial fracture or suspicious bone lesion. Skull base is unremarkable. Sinuses/Orbits: No acute finding. Other: None. CT CERVICAL SPINE FINDINGS Alignment: No traumatic malalignment. Skull base and vertebrae: No acute fracture. Normal craniocervical junction. No suspicious bone lesions. Soft tissues and spinal canal: No prevertebral fluid or swelling. No visible canal hematoma. Disc levels: Mild cervical spondylosis without high-grade spinal canal stenosis. Upper chest: Unchanged diffuse nodular opacities in the lung apices. Other: None. IMPRESSION: 1. No acute intracranial abnormality. 2. No acute cervical spine fracture or traumatic malalignment. 3. Unchanged diffuse nodular opacities in the lung apices. Electronically Signed   By: Orvan Falconer M.D.   On: 11/20/2022 21:20    Procedures Procedures  {Document cardiac monitor, telemetry assessment procedure when appropriate:1}  Medications Ordered in ED Medications  ipratropium-albuterol (DUONEB) 0.5-2.5 (3) MG/3ML nebulizer solution 3 mL (has no administration in time range)    ED Course/ Medical Decision Making/ A&P                          Medical Decision Making Amount and/or Complexity of Data Reviewed Labs: ordered. Radiology: ordered.  Risk Prescription drug management.    This patient presents to the ED for concern of ***, this involves an  extensive number of treatment options, and is a complaint that carries with it a high risk of complications and morbidity.  I considered the following differential and admission for this acute, potentially life threatening condition.   MDM:    ***     Labs: I Ordered, and personally interpreted labs.  The pertinent results include:  ***  Imaging Studies ordered: I ordered imaging studies including *** I independently visualized and interpreted imaging. I agree with the radiologist interpretation  Additional history obtained from ***.  External records from outside source obtained and reviewed including ***  Cardiac Monitoring: .The patient was maintained on a cardiac monitor.  I personally viewed and interpreted the cardiac monitored which showed an underlying rhythm of: ***  Reevaluation: After the interventions noted above, I reevaluated the patient and found that they have :{resolved/improved/worsened:23923::"improved"}  Social Determinants of Health: .***  Disposition:  ***  Co morbidities that complicate the patient evaluation . Past Medical History:  Diagnosis Date  . Alzheimer disease (HCC)   . Asthma   . Chronic bronchitis (HCC)   .  Dysthymic disorder   . Emphysema   . Hyperlipidemia   . Hypertension   . hypothyroid   . PVD (peripheral vascular disease) (HCC)      Medicines Meds ordered this encounter  Medications  . ipratropium-albuterol (DUONEB) 0.5-2.5 (3) MG/3ML nebulizer solution 3 mL    I have reviewed the patients home medicines and have made adjustments as needed  Problem List / ED Course: Problem List Items Addressed This Visit   None        {Document critical care time when appropriate:1} {Document review of labs and clinical decision tools ie heart score, Chads2Vasc2 etc:1}  {Document your independent review of radiology images, and any outside records:1} {Document your discussion with family members, caretakers, and with  consultants:1} {Document social determinants of health affecting pt's care:1} {Document your decision making why or why not admission, treatments were needed:1}  This note was created using dictation software, which may contain spelling or grammatical errors.

## 2022-11-20 NOTE — ED Triage Notes (Signed)
Family at bedside reports fall while attempted to go to the bathroom around 1am and was found 15-20 mins later on the floor. Reports has prior hip fx. Reports hx of alzheimers dementia, and when asked if she hit her head, she said yes, denies LOC.

## 2022-11-21 DIAGNOSIS — M25551 Pain in right hip: Secondary | ICD-10-CM | POA: Diagnosis not present

## 2022-11-21 LAB — CBC WITH DIFFERENTIAL/PLATELET
Abs Immature Granulocytes: 0.03 10*3/uL (ref 0.00–0.07)
Basophils Absolute: 0.1 10*3/uL (ref 0.0–0.1)
Basophils Relative: 1 %
Eosinophils Absolute: 0.5 10*3/uL (ref 0.0–0.5)
Eosinophils Relative: 5 %
HCT: 42.1 % (ref 36.0–46.0)
Hemoglobin: 13.3 g/dL (ref 12.0–15.0)
Immature Granulocytes: 0 %
Lymphocytes Relative: 23 %
Lymphs Abs: 2.2 10*3/uL (ref 0.7–4.0)
MCH: 30.1 pg (ref 26.0–34.0)
MCHC: 31.6 g/dL (ref 30.0–36.0)
MCV: 95.2 fL (ref 80.0–100.0)
Monocytes Absolute: 1 10*3/uL (ref 0.1–1.0)
Monocytes Relative: 10 %
Neutro Abs: 5.7 10*3/uL (ref 1.7–7.7)
Neutrophils Relative %: 61 %
Platelets: 205 10*3/uL (ref 150–400)
RBC: 4.42 MIL/uL (ref 3.87–5.11)
RDW: 14.2 % (ref 11.5–15.5)
WBC: 9.5 10*3/uL (ref 4.0–10.5)
nRBC: 0 % (ref 0.0–0.2)

## 2022-11-21 LAB — COMPREHENSIVE METABOLIC PANEL
ALT: 11 U/L (ref 0–44)
AST: 19 U/L (ref 15–41)
Albumin: 3.4 g/dL — ABNORMAL LOW (ref 3.5–5.0)
Alkaline Phosphatase: 77 U/L (ref 38–126)
Anion gap: 9 (ref 5–15)
BUN: 27 mg/dL — ABNORMAL HIGH (ref 8–23)
CO2: 25 mmol/L (ref 22–32)
Calcium: 8.3 mg/dL — ABNORMAL LOW (ref 8.9–10.3)
Chloride: 103 mmol/L (ref 98–111)
Creatinine, Ser: 0.83 mg/dL (ref 0.44–1.00)
GFR, Estimated: >60 mL/min (ref >60)
Glucose, Bld: 109 mg/dL — ABNORMAL HIGH (ref 70–99)
Potassium: 4.1 mmol/L (ref 3.5–5.1)
Sodium: 137 mmol/L (ref 135–145)
Total Bilirubin: 0.3 mg/dL (ref 0.3–1.2)
Total Protein: 6.6 g/dL (ref 6.5–8.1)

## 2022-11-21 LAB — URINALYSIS, ROUTINE W REFLEX MICROSCOPIC
Bilirubin Urine: NEGATIVE
Glucose, UA: NEGATIVE mg/dL
Ketones, ur: NEGATIVE mg/dL
Leukocytes,Ua: NEGATIVE
Nitrite: NEGATIVE
Protein, ur: NEGATIVE mg/dL
Specific Gravity, Urine: >=1.03 (ref 1.005–1.030)
pH: 5.5 (ref 5.0–8.0)

## 2022-11-21 LAB — URINALYSIS, MICROSCOPIC (REFLEX)

## 2022-11-21 LAB — SARS CORONAVIRUS 2 BY RT PCR: SARS Coronavirus 2 by RT PCR: NEGATIVE

## 2023-04-06 ENCOUNTER — Emergency Department (HOSPITAL_COMMUNITY)

## 2023-04-06 ENCOUNTER — Encounter (HOSPITAL_COMMUNITY): Payer: Self-pay | Admitting: Emergency Medicine

## 2023-04-06 ENCOUNTER — Emergency Department (HOSPITAL_COMMUNITY)
Admission: EM | Admit: 2023-04-06 | Discharge: 2023-04-07 | Disposition: A | Discharge status: Home / Self Care | Attending: Emergency Medicine | Admitting: Emergency Medicine

## 2023-04-06 ENCOUNTER — Other Ambulatory Visit: Payer: Self-pay

## 2023-04-06 DIAGNOSIS — I10 Essential (primary) hypertension: Secondary | ICD-10-CM | POA: Insufficient documentation

## 2023-04-06 DIAGNOSIS — F0280 Dementia in other diseases classified elsewhere without behavioral disturbance: Secondary | ICD-10-CM | POA: Insufficient documentation

## 2023-04-06 DIAGNOSIS — W06XXXA Fall from bed, initial encounter: Secondary | ICD-10-CM | POA: Diagnosis not present

## 2023-04-06 DIAGNOSIS — G309 Alzheimer's disease, unspecified: Secondary | ICD-10-CM | POA: Insufficient documentation

## 2023-04-06 DIAGNOSIS — M25552 Pain in left hip: Secondary | ICD-10-CM | POA: Insufficient documentation

## 2023-04-06 DIAGNOSIS — R519 Headache, unspecified: Secondary | ICD-10-CM | POA: Diagnosis present

## 2023-04-06 DIAGNOSIS — M542 Cervicalgia: Secondary | ICD-10-CM | POA: Diagnosis not present

## 2023-04-06 DIAGNOSIS — N390 Urinary tract infection, site not specified: Secondary | ICD-10-CM

## 2023-04-06 DIAGNOSIS — S0083XA Contusion of other part of head, initial encounter: Secondary | ICD-10-CM | POA: Diagnosis not present

## 2023-04-06 DIAGNOSIS — E039 Hypothyroidism, unspecified: Secondary | ICD-10-CM | POA: Diagnosis not present

## 2023-04-06 DIAGNOSIS — J939 Pneumothorax, unspecified: Secondary | ICD-10-CM | POA: Diagnosis not present

## 2023-04-06 DIAGNOSIS — M549 Dorsalgia, unspecified: Secondary | ICD-10-CM | POA: Diagnosis not present

## 2023-04-06 DIAGNOSIS — M25551 Pain in right hip: Secondary | ICD-10-CM | POA: Insufficient documentation

## 2023-04-06 DIAGNOSIS — J90 Pleural effusion, not elsewhere classified: Secondary | ICD-10-CM | POA: Insufficient documentation

## 2023-04-06 DIAGNOSIS — I700 Atherosclerosis of aorta: Secondary | ICD-10-CM | POA: Insufficient documentation

## 2023-04-06 DIAGNOSIS — J984 Other disorders of lung: Secondary | ICD-10-CM | POA: Insufficient documentation

## 2023-04-06 DIAGNOSIS — W19XXXA Unspecified fall, initial encounter: Secondary | ICD-10-CM

## 2023-04-06 LAB — URINALYSIS, W/ REFLEX TO CULTURE (INFECTION SUSPECTED)
Bilirubin Urine: NEGATIVE
Glucose, UA: NEGATIVE mg/dL
Ketones, ur: 5 mg/dL — AB
Leukocytes,Ua: NEGATIVE
Nitrite: POSITIVE — AB
Protein, ur: NEGATIVE mg/dL
Specific Gravity, Urine: 1.02 (ref 1.005–1.030)
pH: 5 (ref 5.0–8.0)

## 2023-04-06 LAB — CBC WITH DIFFERENTIAL/PLATELET
Abs Immature Granulocytes: 0.07 10*3/uL (ref 0.00–0.07)
Basophils Absolute: 0 10*3/uL (ref 0.0–0.1)
Basophils Relative: 0 %
Eosinophils Absolute: 0.1 10*3/uL (ref 0.0–0.5)
Eosinophils Relative: 1 %
HCT: 47.7 % — ABNORMAL HIGH (ref 36.0–46.0)
Hemoglobin: 15.5 g/dL — ABNORMAL HIGH (ref 12.0–15.0)
Immature Granulocytes: 1 %
Lymphocytes Relative: 10 %
Lymphs Abs: 1 10*3/uL (ref 0.7–4.0)
MCH: 30 pg (ref 26.0–34.0)
MCHC: 32.5 g/dL (ref 30.0–36.0)
MCV: 92.3 fL (ref 80.0–100.0)
Monocytes Absolute: 0.5 10*3/uL (ref 0.1–1.0)
Monocytes Relative: 6 %
Neutro Abs: 7.9 10*3/uL — ABNORMAL HIGH (ref 1.7–7.7)
Neutrophils Relative %: 82 %
Platelets: 220 10*3/uL (ref 150–400)
RBC: 5.17 MIL/uL — ABNORMAL HIGH (ref 3.87–5.11)
RDW: 13.7 % (ref 11.5–15.5)
WBC: 9.6 10*3/uL (ref 4.0–10.5)
nRBC: 0 % (ref 0.0–0.2)

## 2023-04-06 LAB — BASIC METABOLIC PANEL
Anion gap: 14 (ref 5–15)
BUN: 19 mg/dL (ref 8–23)
CO2: 25 mmol/L (ref 22–32)
Calcium: 8.9 mg/dL (ref 8.9–10.3)
Chloride: 103 mmol/L (ref 98–111)
Creatinine, Ser: 0.85 mg/dL (ref 0.44–1.00)
GFR, Estimated: >60 mL/min (ref >60)
Glucose, Bld: 99 mg/dL (ref 70–99)
Potassium: 4.2 mmol/L (ref 3.5–5.1)
Sodium: 142 mmol/L (ref 135–145)

## 2023-04-06 LAB — CK: Total CK: 263 U/L — ABNORMAL HIGH (ref 38–234)

## 2023-04-06 MED ORDER — CEPHALEXIN 500 MG PO CAPS: 500.0000 mg | ORAL_CAPSULE | Freq: Four times a day (QID) | ORAL | 0 refills | Status: AC

## 2023-04-06 MED ORDER — CEPHALEXIN 250 MG PO CAPS
500.0000 mg | ORAL_CAPSULE | Freq: Once | ORAL | Status: AC
  Administered 2023-04-06: 500 mg via ORAL
  Filled 2023-04-06: qty 2

## 2023-04-06 NOTE — Discharge Instructions (Addendum)
 Your overall workup this evening was reassuring.  You had a small abnormality on your chest x-ray which appears to have now resolved.  You did have signs of a urinary tract infection.  Please take the prescribed antibiotics until complete.  Follow-up as needed with your primary care provider.

## 2023-04-06 NOTE — ED Provider Notes (Addendum)
 [place] [facility] AT [facility] Provider Note  Arrival date/time:04/06/2023 6:40 PM  HPI/ROS   Mackenzie Peterson is a 89 y.o. female with PMH significant for dementia, hypothyroidism, depression, who presents for fall History is provided by EMS and patient.  Patient had a fall out of bed around 430 a.m. this morning.  Fall was unwitnessed.  Family found patient on the ground, patient does not remember what happened. Patient does have history of dementia and has had occasional falls due to difficulty walking independently. Patient is been walking around at home and has not had any numbness, weakness or tingling.  However did start complaining of a headache, prompting her family to call EMS.   Spoke with patient's family member over the phone, Vianne Bulls, at number in the chart. Per family, due to patient's Alzheimer's/dementia, she tends to wander.  This morning, she got out of bed early and fell onto the ground.  They were able to get her back up and she had been walking around normally, however had been complaining of increased pain in her hips and had a bruise on her forehead and so called EMS.  Family endorses that patient is confused at baseline and did not seem to be off her baseline.  A complete ROS was performed with pertinent positives/negatives noted above.   ED Course and Medical Decision Making   I personally reviewed the patient's vitals.  ER Provider interpretation of labs: CBC shows no leukocytosis or anemia BMP shows no metabolic derangement, or AKI CK is very mildly elevated to 263 Urinalysis with positive nitrates and bacteria  Assessment/Plan: This is a well-appearing 89 year old patient with history of dementia who is presenting for a fall out of bed this morning.  On exam, her only external sign of trauma is a bruise to her forehead. Her CT head shows no ICH or skull fracture CT of the C-spine shows no acute fracture or malalignment of  the cervical spine. Chest x-ray shows no pneumothorax or displaced rib fracture Pelvis x-ray shows pelvic ring intact and both hips are located.  Her lab workup is reassuring other than a very mild elevation in CK to 263.  She has no AKI.  This may be in the setting of being down on the ground for a few hours, however no indication for IV fluids for or admission due to elevated CK at this time.   CT imaging shows no traumatic injury in the head or C-spine, however c-spine imaging shows trace left pneumothorax. Patient placed on   Will obtain CT chest WO to assess for occult rib fractures.  Called patient's daughter-in-law Vianne Bulls, to update her on patient's status.  Patient remained in the department at the time that I departed and their workup was ongoing and care was signed out to the oncoming provider. Please see additional notes for documentation of their continued care.    Plan at time of handoff:  -F/u CT chest for ribs fractures -if negative, repeat CXR at 6 hour mark to assess for stability of PNX   Disposition: Pending CT imaging  Clinical Impression:  1. Fall, initial encounter     Rx / DC Orders ED Discharge Orders     None       The plan for this patient was discussed with Dr. Eloise Harman, who voiced agreement and who oversaw evaluation and treatment of this patient.   Clinical Complexity A medically appropriate history, review of systems, and physical exam was performed.  Patient's  presentation is most consistent with acute presentation with potential threat to life or bodily function.  Medical Decision Making Amount and/or Complexity of Data Reviewed Labs: ordered. Radiology: ordered.  Risk Prescription drug management.    Physical Exam and Medical History   Vitals:   04/06/23 1717 04/06/23 1718 04/06/23 1719  BP:   (!) 164/54  Pulse:   79  Resp:   16  Temp:   98.5 F (36.9 C)  TempSrc:   Oral  SpO2: 93%  98%  Weight:  48.2 kg    Height:  5' (1.524 m)      Physical Exam:  General: No distress, appears well hydrated and well nourished   Head: Normocephalic, bruise to forehead No skull depressions or lacerations.  No conjunctival hemorrhage No periorbital ecchymoses, [place] Eyes, or Battle Sign bilaterally Ears atraumatic No nasal septal deviation or hematoma  PERRL, EOMI, sclera anicteric. Mucus membranes moist.    Neck: Supple, trachea midline No TTP over midline cervical spine, no step offs or deformities.   Cardiovascular: RATE: regular RHYTHM: regular 2+ radial, femoral, DP pulses bilaterally   Respiratory/Chest Wall: Respiratory: normal WOB, breath sounds CTAB Clavicles stable to compression Chest stable to AP and Lateral Compression,  Chest nontender to palpation ,  Crepitus not present.   Extremities: Warm, well perfused. No gross deformities.    Gastrointestinal: Abdomen soft, non tender, non distended   Neurologic: LOC: awake/alert EOM:  intact, conjugate   Genitourinary: Normal genitalia   Skin: Normal, no rash or lesions.   Glasgow Coma Scale: Eye opening: 4  Verbal:  5  Motor:  5  GCS Total: 14     Rectal: Deferred   Spine: No TTP along midline C/T/L spine, no step offs or deformities    Other:       Medical History: No Known Allergies Past Medical History:  Diagnosis Date   Alzheimer disease (HCC)    Asthma    Chronic bronchitis (HCC)    Dysthymic disorder    Emphysema    Hyperlipidemia    Hypertension    hypothyroid    PVD (peripheral vascular disease) (HCC)     Past Surgical History:  Procedure Laterality Date   ANTERIOR APPROACH HEMI HIP ARTHROPLASTY Left 08/07/2021   Procedure: ANTERIOR APPROACH HEMI HIP ARTHROPLASTY;  Surgeon: Samson Frederic, MD;  Location: [facility] ORS;  Service: Orthopedics;  Laterality: Left;   INTRAMEDULLARY (IM) NAIL INTERTROCHANTERIC Right 11/28/2019   Procedure: INTRAMEDULLARY (IM) NAIL INTERTROCHANTRIC;  Surgeon: Yolonda Kida, MD;  Location: [facility] OR;  Service: Orthopedics;  Laterality: Right;   History reviewed. No pertinent family history.  Social History   Tobacco Use   Smoking status: Never   Smokeless tobacco: Never  Substance Use Topics   Alcohol use: No   Drug use: No    Procedures   If procedures were preformed on this patient, they are listed below:  Procedures   -------- HPI and MDM generated using voice dictation software and may contain dictation errors. Please contact me for any clarification or with any questions.   Cephus Slater, MD Emergency Medicine PGY-2    Caron Presume, MD 04/06/23 2205    Caron Presume, MD 04/06/23 2223    Rondel Baton, MD 04/12/23 2202

## 2023-04-06 NOTE — ED Notes (Signed)
 Pt went to CT

## 2023-04-06 NOTE — ED Notes (Signed)
 Patient placed on O2 at 6L via [place] per MD

## 2023-04-06 NOTE — ED Notes (Signed)
Pt given PO fluids at this time 

## 2023-04-06 NOTE — ED Triage Notes (Signed)
 Pt BIB EMS from home after an unwitnessed fall at 0430 this morning. Pt reports head, neck, back, and BIL hip pain. Hematoma to R forehead. Pt not on blood thinners. Hx of dementia.

## 2023-04-06 NOTE — Progress Notes (Signed)
 [facility] ED15 [facility] Liaison Note  This patient is currently enrolled in [employer], [employer] Primary Home Based Care program.  In the absence of any acute needs, [facility] can follow up on Monday for an home health, palliative care or GUIDE needs, including assessments, blood work and/or xrays.  Please call with any questions.  Thank you, Haynes Bast, BSN, [facility] [phone]

## 2023-04-07 ENCOUNTER — Emergency Department (HOSPITAL_COMMUNITY)

## 2023-04-07 DIAGNOSIS — S0083XA Contusion of other part of head, initial encounter: Secondary | ICD-10-CM | POA: Diagnosis not present

## 2023-04-07 NOTE — ED Provider Notes (Signed)
 Patient here with unwitnessed fall, tiny pneumothorax cleared by the emergency department for return to home unable to contact family lives at home at this time.  Patient also has urinary tract infection, antibiotics called in. Physical Exam  BP (!) 131/56   Pulse 65   Temp (!) 97.3 F (36.3 C) (Temporal)   Resp (!) 21   Ht 5' (1.524 m)   Wt 48.2 kg   SpO2 100%   BMI 20.75 kg/m   Physical Exam  Procedures  Procedures  ED Course / MDM   Clinical Course as of 04/07/23 1531  Fri Apr 06, 2023  2138 Radiology called. States that she has a trace left apical ptx that is new.  [RP]  Sat Apr 07, 2023  0500 Attempted to call family with no answer [LM]  0737 Pt family contacted and will come to get her " in a few hours."  [AH]    Clinical Course User Index [AH] Arthor Captain, PA-C [LM] Darrick Grinder, PA-C [RP] Rondel Baton, MD   Medical Decision Making Amount and/or Complexity of Data Reviewed Labs: ordered. Radiology: ordered.  Risk Prescription drug management.   Diet ordered, patient fed here in the ED.   Patient discharged to family.       Arthor Captain, PA-C 04/07/23 1532    Jacalyn Lefevre, MD 04/07/23 [phone]

## 2023-04-07 NOTE — ED Notes (Signed)
 Patient discharged by this RN. Patient's daughter in law at bedside to take patient home.

## 2023-04-07 NOTE — ED Notes (Signed)
 Spoke with Patient's emergency contact Cordelia Poche and she stated someone would come and pick the patient up but it would be a couple of hours. We will continue to monitor.

## 2023-04-07 NOTE — ED Provider Notes (Signed)
  Physical Exam  BP (!) 131/56   Pulse 65   Temp (!) 97.3 F (36.3 C) (Temporal)   Resp (!) 21   Ht 5' (1.524 m)   Wt 48.2 kg   SpO2 100%   BMI 20.75 kg/m   Physical Exam  Procedures  Procedures  ED Course / MDM   Clinical Course as of 04/07/23 0639  Fri Apr 06, 2023  2138 Radiology called. States that she has a trace left apical ptx that is new.  [RP]  Sat Apr 07, 2023  0500 Attempted to call family with no answer [LM]    Clinical Course User Index [LM] Darrick Grinder, PA-C [RP] Rondel Baton, MD   Medical Decision Making Amount and/or Complexity of Data Reviewed Labs: ordered. Radiology: ordered.  Risk Prescription drug management.   Patient care assumed at shift handoff from Dr. Craige Cotta.  Please see her note for full details.  In short, 89 year old female patient with past medical history significant for dementia presented to the emergency department due to an unwitnessed fall.  Patient reportedly fell to bed around 4:30 AM yesterday morning and was found on the ground.  Patient does not voice remembering what happened.  She has occasional falls due to difficulty walking independently.  Patient reportedly complained of headache during the day and EMS was called.  Family reported that patient has Alzheimer's dementia and tends to wander.  Patient is confused at baseline and is reportedly at mental baseline.  At time of shift handoff CT scan of the cervical spine and head had found an incidental finding of a trace pneumothorax.  CT chest had been ordered for further evaluation.  CT chest shows a small 5% pneumothorax on the left side.  Patient was monitored for an additional 4 hours after this CT and a repeat chest x-ray was done which shows no pneumothorax.  No sign of worsening pneumothorax at this time.  Patient does not require oxygen at this time to maintain normal saturations.  There is no indication at this time for further emergent workup.  Patient was found to  have urinary tract infection during initial workup and antibiotics have been sent to the pharmacy for the same.  Patient cleared at this time for discharge home.  Disposition pending successful attempted contacting family. Patient care being transferred to Arthor Captain, PA-C pending contact with family.      Pamala Duffel 04/07/23 6378    Nira Conn, MD 04/07/23 [phone]

## 2023-04-07 NOTE — ED Notes (Signed)
 Patient's daughter in law Natalia Leatherwood contacted by this RN. Patient's daughter in law states she is on the way to pick patient up and will be here in less than an hour.
